# Patient Record
Sex: Male | Born: 1945
Health system: Southern US, Community
[De-identification: ages and names within clinical notes are randomized; demographics above are authoritative.]

## PROBLEM LIST (undated history)

## (undated) DIAGNOSIS — E785 Hyperlipidemia, unspecified: Secondary | ICD-10-CM

## (undated) DIAGNOSIS — E119 Type 2 diabetes mellitus without complications: Secondary | ICD-10-CM

## (undated) DIAGNOSIS — I219 Acute myocardial infarction, unspecified: Secondary | ICD-10-CM

## (undated) DIAGNOSIS — I10 Essential (primary) hypertension: Secondary | ICD-10-CM

## (undated) HISTORY — DX: Essential (primary) hypertension: I10

## (undated) HISTORY — PX: CARDIAC SURGERY: SHX584

## (undated) HISTORY — PX: APPENDECTOMY: SHX54

## (undated) HISTORY — DX: Type 2 diabetes mellitus without complications: E11.9

## (undated) HISTORY — PX: TONSILLECTOMY AND ADENOIDECTOMY: SHX28

## (undated) HISTORY — DX: Hyperlipidemia, unspecified: E78.5

## (undated) HISTORY — DX: Acute myocardial infarction, unspecified: I21.9

## (undated) HISTORY — PX: REPLACEMENT TOTAL KNEE: SUR1224

## (undated) HISTORY — PX: EYE SURGERY: SHX253

---

## 2002-03-04 DIAGNOSIS — E785 Hyperlipidemia, unspecified: Secondary | ICD-10-CM | POA: Insufficient documentation

## 2002-06-29 DIAGNOSIS — I219 Acute myocardial infarction, unspecified: Secondary | ICD-10-CM | POA: Insufficient documentation

## 2012-04-27 ENCOUNTER — Ambulatory Visit: Payer: Self-pay | Admitting: General Practice

## 2012-04-27 DIAGNOSIS — I2510 Atherosclerotic heart disease of native coronary artery without angina pectoris: Secondary | ICD-10-CM

## 2012-04-27 LAB — URINALYSIS, COMPLETE
Bilirubin,UR: NEGATIVE
Ketone: NEGATIVE
Leukocyte Esterase: NEGATIVE
Nitrite: NEGATIVE
Protein: NEGATIVE
RBC,UR: <1 /HPF (ref 0–5)
Specific Gravity: 1.017 (ref 1.003–1.030)
Squamous Epithelial: <1

## 2012-04-27 LAB — BASIC METABOLIC PANEL
BUN: 11 mg/dL (ref 7–18)
Chloride: 107 mmol/L (ref 98–107)
Co2: 27 mmol/L (ref 21–32)
Creatinine: 0.63 mg/dL (ref 0.60–1.30)
Potassium: 4.2 mmol/L (ref 3.5–5.1)

## 2012-04-27 LAB — MRSA PCR SCREENING

## 2012-04-27 LAB — CBC
HCT: 45.9 % (ref 40.0–52.0)
HGB: 15.6 g/dL (ref 13.0–18.0)
MCHC: 33.9 g/dL (ref 32.0–36.0)
MCV: 93 fL (ref 80–100)
Platelet: 166 10*3/uL (ref 150–440)
RBC: 4.95 10*6/uL (ref 4.40–5.90)
RDW: 12.7 % (ref 11.5–14.5)
WBC: 6.8 10*3/uL (ref 3.8–10.6)

## 2012-04-27 LAB — PROTIME-INR: INR: 1

## 2012-04-27 LAB — SEDIMENTATION RATE: Erythrocyte Sed Rate: 2 mm/hr (ref 0–20)

## 2012-04-28 LAB — URINE CULTURE

## 2012-05-18 ENCOUNTER — Inpatient Hospital Stay: Payer: Self-pay | Admitting: General Practice

## 2012-05-19 LAB — PLATELET COUNT: Platelet: 152 10*3/uL (ref 150–440)

## 2012-05-19 LAB — BASIC METABOLIC PANEL
Anion Gap: 5 — ABNORMAL LOW (ref 7–16)
Calcium, Total: 8 mg/dL — ABNORMAL LOW (ref 8.5–10.1)
Chloride: 108 mmol/L — ABNORMAL HIGH (ref 98–107)
Creatinine: 0.76 mg/dL (ref 0.60–1.30)
Glucose: 102 mg/dL — ABNORMAL HIGH (ref 65–99)
Osmolality: 279 (ref 275–301)
Potassium: 3.9 mmol/L (ref 3.5–5.1)

## 2012-05-19 LAB — HEMOGLOBIN: HGB: 12.5 g/dL — ABNORMAL LOW (ref 13.0–18.0)

## 2012-05-20 LAB — BASIC METABOLIC PANEL
Anion Gap: 7 (ref 7–16)
BUN: 10 mg/dL (ref 7–18)
Chloride: 104 mmol/L (ref 98–107)
Co2: 25 mmol/L (ref 21–32)
Creatinine: 0.83 mg/dL (ref 0.60–1.30)
Glucose: 110 mg/dL — ABNORMAL HIGH (ref 65–99)

## 2012-05-20 LAB — HEMOGLOBIN: HGB: 11.3 g/dL — ABNORMAL LOW (ref 13.0–18.0)

## 2012-05-21 ENCOUNTER — Encounter: Payer: Self-pay | Admitting: Internal Medicine

## 2012-06-02 ENCOUNTER — Encounter: Payer: Self-pay | Admitting: Internal Medicine

## 2012-07-02 ENCOUNTER — Encounter: Payer: Self-pay | Admitting: Internal Medicine

## 2013-03-24 ENCOUNTER — Inpatient Hospital Stay: Payer: Self-pay | Admitting: Cardiology

## 2013-03-25 LAB — BASIC METABOLIC PANEL
ANION GAP: 7 (ref 7–16)
BUN: 9 mg/dL (ref 7–18)
CO2: 25 mmol/L (ref 21–32)
Calcium, Total: 8.7 mg/dL (ref 8.5–10.1)
Chloride: 107 mmol/L (ref 98–107)
Creatinine: 0.73 mg/dL (ref 0.60–1.30)
EGFR (African American): >60
EGFR (Non-African Amer.): >60
Glucose: 123 mg/dL — ABNORMAL HIGH (ref 65–99)
Osmolality: 278 (ref 275–301)
Potassium: 3.7 mmol/L (ref 3.5–5.1)
Sodium: 139 mmol/L (ref 136–145)

## 2014-01-18 DIAGNOSIS — Z9889 Other specified postprocedural states: Secondary | ICD-10-CM | POA: Insufficient documentation

## 2014-06-24 NOTE — Op Note (Signed)
PATIENT NAME:  Eric Joyce, Eric Joyce MR#:  [id number] DATE OF BIRTH:  09/29/1945  DATE OF PROCEDURE:  05/18/2012  PREOPERATIVE DIAGNOSIS: Degenerative arthrosis of the right knee.   POSTOPERATIVE DIAGNOSIS: Degenerative arthrosis of the right knee.   PROCEDURE PERFORMED: Right total knee arthroplasty using computer-assisted navigation.   SURGEON: Laurice Record. Hooten, M.D.   ASSISTANT:  Vance Peper, PA  ANESTHESIA: Femoral nerve block and spinal.   ESTIMATED BLOOD LOSS: 100 mL.   FLUIDS REPLACED: 2200 mL of crystalloid.   TOURNIQUET TIME: 115 minutes.   DRAINS: Two medium drains to reinfusion system.   SOFT TISSUE RELEASES: Anterior cruciate ligament, posterior cruciate ligament, deep and superficial medial collateral ligament, and patellofemoral ligament.   IMPLANTS UTILIZED: DePuy PFC Sigma size 5 posterior stabilized femoral component (cemented), size 5 MBT tibial component (cemented), 41 mm 3-peg oval dome patella (cemented), and a 10 mm stabilized rotating platform polyethylene insert.   INDICATIONS FOR SURGERY: The patient is a 69 year old gentleman who has been seen for complaints of progressive right knee pain and varus deformity. X-rays demonstrated severe degenerative changes in tricompartmental fashion with varus deformity appreciated. After discussion of the risks and benefits of surgical intervention, the patient expressed understanding of the risks and benefits and agreed with plans for surgical intervention.   PROCEDURE IN DETAIL: The patient was brought into the operating room and, after adequate femoral nerve block and spinal anesthesia was achieved, a tourniquet was placed on the patient's upper right thigh. The patient's right knee and leg were cleaned and prepped with alcohol and DuraPrep and draped in the usual sterile fashion. A "timeout" was performed as per usual protocol. The right lower extremity was exsanguinated using an Esmarch, and the tourniquet was inflated to 300  mmHg. An anterior longitudinal incision was made followed by a standard mid vastus approach. A moderate effusion was evacuated. The deep fibers of the medial collateral ligament were elevated in subperiosteal fashion off the medial flare of the tibia so as to maintain a continuous soft tissue sleeve. The patella was subluxed laterally and the patellofemoral ligament was incised. Inspection of the knee demonstrated severe degenerative changes in all 3 compartments with evidence of eburnated bone noted to the medial compartment. Prominent osteophytes were debrided using a rongeur. Anterior and posterior cruciate ligaments were excised. Two 4.0 mm Schanz pins were inserted into the femur and into the tibia for attachment of the array of trackers used for computer-assisted navigation. Hip center was identified using circumduction technique. Distal landmarks were mapped using the computer. The distal femur and proximal tibia were mapped using the computer. Distal femoral cutting guide was positioned using computer-assisted navigation so as to achieve 5 degree distal valgus cut. Cut was performed and verified using the computer. The distal femur was sized and it was felt that a size 5 femoral component was appropriate. A size 5 cutting guide was positioned and the anterior cut was performed and verified using the computer. This was followed by completion of the posterior and chamfer cuts. Femoral cutting guide for the central box was then positioned and the central box cut was performed. Attention was then directed to the proximal tibia. Medial and lateral menisci were excised. The extramedullary tibial cutting guide was positioned using computer-assisted navigation so as to achieve 0 degree varus valgus alignment and 0 degree posterior slope. Cut was performed and verified using the computer. The proximal tibia was sized and it was felt that a size 5 tibial tray was appropriate. Tibial and femoral  trials were inserted.  Prominent posterior osteophytes were debrided from the femoral condyles. A 10 mm polyethylene insert was inserted and the knee was placed through a range of motion. The knee was felt to be tight medially. A Cobb elevator was used to elevate the superficial fibers of the medial collateral ligament. This allowed for good medial and lateral soft tissue balancing both in flexion and in extension. Finally, the patella was cut and prepared so as to accommodate a 41 mm three peg oval dome patella. Patellar trial was placed and the knee was placed through a range of motion with excellent patellar tracking appreciated.   Femoral trial was removed. Central post hole for the tibial component was reamed followed by insertion of a keel punch. Tibial trials were then removed. The cut surfaces of bone were irrigated with copious amounts of normal saline with antibiotic solution using pulsatile lavage and then suctioned dry. Polymethyl methacrylate cement with gentamicin was prepared in the usual fashion using a vacuum mixer. Cement was applied to the cut surface of the proximal tibia as well as along the undersurface of a size 5 MBT tibial component. Tibial component was positioned and impacted into place. Excess cement was removed using freer elevators. Cement was then applied to the cut surfaces of the femur as well as along the posterior flanges of a size 5 posterior stabilized femoral component. Femoral component was positioned and impacted into place. Excess cement was removed using freer elevators. A 10 mm polyethylene trial was inserted and the knee was brought into full extension with steady axial compression applied. Finally, cement was applied to the backside of a 41 mm 3-peg oval dome patella and the patellar component was positioned and patellar clamp applied. Excess cement was removed using freer elevators.   After adequate curing of cement, the tourniquet was deflated after total tourniquet time of 115 minutes.  Hemostasis was achieved using electrocautery. The knee was irrigated with copious amounts of normal saline with antibiotic solution using pulsatile lavage and then suctioned dry. The knee was inspected for any residual cement debris. 30 mL of 0.25% Marcaine with epinephrine and 4 mg of morphine was injected along the posterior capsule. A 10 mm stabilized rotating platform polyethylene insert was inserted and the knee was placed through a range of motion. Excellent mediolateral soft tissue balancing was appreciated both in flexion and in extension. Good patellar tracking was appreciated.   Two medium drains were placed in the wound bed and brought out through a separate stab incision to be attached to a reinfusion system. The medial parapatellar portion of the incision was reapproximated using interrupted sutures of #1 Vicryl. The subcutaneous tissue was approximated in layers using first #0 Vicryl followed by #2-0 Vicryl. Skin was closed with skin staples. A sterile dressing was applied.   The patient tolerated the procedure well. He was transported to the recovery room in stable condition. ____________________________ Laurice Record. Holley Bouche., MD jph:sb D: 05/18/2012 20:28:57 ET T: 05/19/2012 09:46:53 ET JOB#: [id number]  cc: Laurice Record. Holley Bouche., MD, <Dictator> Laurice Record Holley Bouche MD ELECTRONICALLY SIGNED 05/19/2012 20:47

## 2014-06-24 NOTE — Discharge Summary (Signed)
PATIENT NAME:  Eric Joyce, Eric Joyce MR#:  [id number] DATE OF BIRTH:  [birth year]  DATE OF ADMISSION:  05/18/2012 DATE OF DISCHARGE:  05/21/2012  ADMITTING DIAGNOSIS: Degenerative arthrosis, right knee.   DISCHARGE DIAGNOSIS: Degenerative arthrosis, right knee.   PROCEDURE: Right total knee arthroplasty using computer-assisted navigation.   SURGEON: Laurice Record. Hooten, M.D.   ASSISTANT: Vance Peper, PA-C.   ANESTHESIA: Femoral nerve block, and spinal.   ESTIMATED BLOOD LOSS: 100 mL.  FLUIDS REPLACED: 2200 mL of crystalloid.   TOURNIQUET TIME: 115 minutes.   DRAINS: Two medium drains to reinfusion system.   CONDITION AT RECOVERY: Stable.   HISTORY: The patient is a 69 year old who presents today for a history and physical. He is to undergo a right total knee on 05/18/2012. He has had a greater than 10-year history of progressive right knee pain. He reports episodes of swelling giving way. He has a remote history of right knee arthroscopy, partial medial and lateral meniscectomies, and chondroplasty. The patient reports increased medial right knee pain with weightbearing activities. He is not currently using any ambulatory aides. He continued to use a sleeve for support. He has difficulty with stair ambulation as well as with arising from a seated position due to the pain. Right knee pain has progressed to the point that it is significantly interfering with his activities of daily living.   PHYSICAL EXAMINATION:  GENERAL: Well-developed, well-nourished male in no acute distress. Antalgic gait. Varus thrust to the right knee.  HEENT: Atraumatic, normocephalic. Pupils equal, round, and reactive to light. Extraocular motion is intact. Sclerae are clear. Oropharynx is clear, with moist mucosa.  LUNGS: Clear to auscultation bilaterally.  CARDIOVASCULAR: Regular rate and rhythm. Normal S1, S2. No murmur. No appreciable gallops, rubs. Peripheral pulses are palpable. No lower extremity edema. Homan's test  is negative.  ABDOMEN: Soft, nontender, nondistended. Bowel sounds are present.  EXTREMITIES: Good strength, stability, and range of motion of the upper extremities. Good range of motion of the hips and ankles.  RIGHT KNEE: Examination of the right knee shows the patient has no swelling, effusion,  erythema. He has crepitus on range of motion. His tenderness is mostly at the medial joint line and he has a relative varus alignment. He has a negative anterior Lachman's test. He has a negative McMurray's test. His range of motion is 06 to 116 degrees. His pulses are palpable and he has good cap refill. There is no gross pretibial ankle edema. Homan's test is negative. He is neurovascularly intact in the right lower extremity.   HOSPITAL COURSE: The patient was admitted to the hospital on 05/18/2012. He had surgery that same day and was brought to the orthopedic floor from the PACU in stable condition. The patient on postoperative day 1, his labs and vital signs remained stable. He had excellent progress with physical therapy up to 200 feet with ambulation. On postoperative day 2 the patient's vital signs and lab work remained stable and the patient did have decreased distances with his physical therapy due to soreness. Overall the patient was doing well and stable. On postoperative day 3, 05/21/2012, the patient was stable and ready for discharge to a rehab facility.   CONDITION AT DISCHARGE: Stable.   DISCHARGE INSTRUCTIONS: The patient may gradually increase weightbearing on the affected extremity. He is weightbearing as tolerated, and he is to elevate the affected foot or leg on 1 or 2 pillows, with the foot higher than the knee. Thigh-high TED hose on both legs, remove at  bedtime. Replace when arising the next morning. Elevate heels off the bed. He needs to use incentive spirometry cough every hour while awake and cough and deep-breathe every hour. He may resume a regular diet as tolerated. He is to  continue using the [facility] Care unit, maintaining a temperature between 40 and 50 degrees. Do not get the dressing or bandage wet or dirty. Call [facility] if the dressing gets water under it. Leave the dressing on. Call [facility] if any of the following occur: Bright red bleeding from the incision wound, fever above 101.5 degrees, redness, swelling, or drainage at the incision. Call [facility] if you experience any increased leg pain, numbness or weakness in legs, or bowel or bladder symptoms. He needs to follow up with [facility] in  2  weeks.   DISCHARGE MEDICATIONS: Zolpidem 10 mg oral tablet, 1 tablet orally once a day at bedtime; simvastatin 40 mg oral tablet,  1 tablet orally once a day in the morning; vitamin D3 1000 international units oral capsule,  1 capsule orally 2 times a day; lisinopril 10 mg oral tablet, 1 tablet orally once a day in the morning; magnesium citrate 1 tablet orally 2 times a day; saw palmetto oral capsule, 1 capsule orally 2 times a day; Co-Q 10, 100 mg oral capsule, one capsule orally 2 times a day; metoprolol succinate ER 50 mg oral tablet extended-release, 1 tablet orally once a day in the morning; Ultimate Omega 100 mg oral capsule one capsule orally 2 times a day; metformin 500 mg oral tablet 1 tablet orally 2 times a day; Alive! once daily, Men's 50 mg tablet, 1 tablet orally once a day; dutasteride 0.5 mg oral capsule, one capsule orally once a day; Tylenol 500 mg oral tablet, 1 tablet orally every 4 hours as needed for pain or temp greater than 101.4; oxycodone 5 mg oral tablet, 1 to 2 tablets orally every 4 hours as needed for pain; tramadol 50 mg oral tablet, 1 to 2 tablets orally every 4 hours as needed for pain; magnesium hydroxide 8% oral suspension, 30 mL orally 2 times a day as needed for constipation; tamsulosin 0.4 mg oral capsule, one capsule orally once daily after meal; Lovenox 40 mg  subcutaneous once a day for 14 days; bisacodyl 10 mg rectal suppository 1 suppository rectally once a day as needed for constipation; omega-3 polyunsaturated fatty acids 1 gram orally 2 times a day; Pantoprazole 40 mg oral delayed-release capsule 2 times a day.     ____________________________ Duanne Guess, PA-C tcg:dm D: 05/21/2012 08:04:00 ET T: 05/21/2012 08:17:09 ET JOB#: [id number]  cc: Duanne Guess, PA-C, <Dictator> Duanne Guess [place] ELECTRONICALLY SIGNED 06/03/2012 12:33

## 2014-06-25 NOTE — Discharge Summary (Signed)
PATIENT NAME:  Eric Joyce, Eric Joyce MR#:  [id number] DATE OF BIRTH:  Apr 16, 1945  DATE OF ADMISSION:  03/24/2013 DATE OF DISCHARGE: 03/25/2013    PRIMARY CARE PHYSICIAN: Dr. Lovie Macadamia.  FINAL DIAGNOSES:  1.  Coronary artery disease.  2.  Hyperlipidemia.   DISCHARGE MEDICATIONS: Aspirin 81 mg daily, simvastatin 40 mg daily, lisinopril 10 mg daily, metoprolol succinate 50 mg daily, zolpidem 10 mg at bedtime, vitamin D 3000 units b.i.d., magnesium citrate 1 tab b.i.d., sol palmetto 1 capsule b.i.d., Co-Q10 1 capsule b.i.d., metformin 500 mg b.i.d., omega-3 polyunsaturated fatty acids 1 gram b.i.d.    PROCEDURES:  1.  Cardiac catheterization with selective coronary arteriography on 03/24/2013.  2.  Fractional flow reserve management on 03/24/2013.   HISTORY OF PRESENT ILLNESS: Please see admission H and [place] COURSE: The patient underwent elective cardiac catheterization on 03/24/2013. Coronary arteriography revealed patent stents in the proximal left circumflex and first obtuse marginal branch, 50% stenosis in the mid left anterior descending coronary artery and 70% stenosis in the mid PDA. Left ventriculography revealed normal left ventricular function. When switching diagnostic left Judkins catheter for guide catheter in order to perform fractional flow reserve management of a 6% stenosis in the ostium of the left circumflex, the patient demonstrated diffuse TIMI 2 flow in the left coronary artery, became bradycardic and asystolic requiring 20 to 30 seconds of CPR. This was restored to sinus rhythm and normal blood pressure. Reinjection of the left coronary artery with a guiding catheter demonstrated TIMI 3 flow. Fractional flow reserve management of the ostial left circumflex coronary artery was performed, which was 0.90 indicative of a non-hemodynamically significant stenosis. Since the patient received CPR, the patient was observed overnight with an uncomplicated hospital course. The patient  remained in sinus rhythm. He did not experience any angina, though did experience chest wall tenderness from the CPR. On the morning of 03/25/2013, the patient was ambulating in the halls without difficulty and was discharged home. He is scheduled to see me in followup in 1 week.  ____________________________ Isaias Cowman, MD ap:aw D: 03/25/2013 07:50:09 ET T: 03/25/2013 08:01:42 ET JOB#: [id number]  cc: Isaias Cowman, MD, <Dictator> Isaias Cowman MD ELECTRONICALLY SIGNED 04/20/2013 12:29

## 2014-06-25 NOTE — Consult Note (Signed)
Brief Consult Note: Diagnosis: Patient experienced asystolic event during cardiac cath requiring brief CPR, possibly due to vasospasm.   Comments: REC  Oberserve at least overnight, cycle cardiac biomakers, tele.  Electronic Signatures: Isaias Cowman (MD)  (Signed 21-Jan-15 12:18)  Authored: Brief Consult Note   Last Updated: 21-Jan-15 12:18 by Isaias Cowman (MD)

## 2017-03-20 DIAGNOSIS — N401 Enlarged prostate with lower urinary tract symptoms: Secondary | ICD-10-CM | POA: Diagnosis not present

## 2017-03-20 DIAGNOSIS — R351 Nocturia: Secondary | ICD-10-CM | POA: Diagnosis not present

## 2017-03-27 DIAGNOSIS — Z9889 Other specified postprocedural states: Secondary | ICD-10-CM | POA: Diagnosis not present

## 2017-03-27 DIAGNOSIS — E119 Type 2 diabetes mellitus without complications: Secondary | ICD-10-CM | POA: Diagnosis not present

## 2017-03-27 DIAGNOSIS — I214 Non-ST elevation (NSTEMI) myocardial infarction: Secondary | ICD-10-CM | POA: Diagnosis not present

## 2017-03-27 DIAGNOSIS — I2510 Atherosclerotic heart disease of native coronary artery without angina pectoris: Secondary | ICD-10-CM | POA: Diagnosis not present

## 2017-03-27 DIAGNOSIS — E785 Hyperlipidemia, unspecified: Secondary | ICD-10-CM | POA: Diagnosis not present

## 2017-04-17 DIAGNOSIS — I214 Non-ST elevation (NSTEMI) myocardial infarction: Secondary | ICD-10-CM | POA: Diagnosis not present

## 2017-04-17 DIAGNOSIS — I2510 Atherosclerotic heart disease of native coronary artery without angina pectoris: Secondary | ICD-10-CM | POA: Diagnosis not present

## 2017-04-24 DIAGNOSIS — I2510 Atherosclerotic heart disease of native coronary artery without angina pectoris: Secondary | ICD-10-CM | POA: Diagnosis not present

## 2017-04-24 DIAGNOSIS — I214 Non-ST elevation (NSTEMI) myocardial infarction: Secondary | ICD-10-CM | POA: Diagnosis not present

## 2017-04-24 DIAGNOSIS — Z9889 Other specified postprocedural states: Secondary | ICD-10-CM | POA: Diagnosis not present

## 2017-04-24 DIAGNOSIS — E785 Hyperlipidemia, unspecified: Secondary | ICD-10-CM | POA: Diagnosis not present

## 2017-06-09 ENCOUNTER — Encounter: Payer: Self-pay | Admitting: Podiatry

## 2017-06-09 ENCOUNTER — Ambulatory Visit: Payer: No Typology Code available for payment source

## 2017-06-09 ENCOUNTER — Ambulatory Visit (INDEPENDENT_AMBULATORY_CARE_PROVIDER_SITE_OTHER): Payer: Non-veteran care

## 2017-06-09 ENCOUNTER — Ambulatory Visit (INDEPENDENT_AMBULATORY_CARE_PROVIDER_SITE_OTHER): Payer: Medicare (Managed Care) | Admitting: Podiatry

## 2017-06-09 DIAGNOSIS — M722 Plantar fascial fibromatosis: Secondary | ICD-10-CM

## 2017-06-09 DIAGNOSIS — M205X1 Other deformities of toe(s) (acquired), right foot: Secondary | ICD-10-CM

## 2017-06-09 MED ORDER — MELOXICAM 7.5 MG PO TABS: 7.5000 mg | ORAL_TABLET | Freq: Every day | ORAL | 0 refills | Status: DC

## 2017-06-09 NOTE — Progress Notes (Signed)
His patient presents the office with chief complaint of painful right heel.  Patient states that he is referred to this office by the [place].  He says he has pain in the right heel after walking. He says his pain today is a 4 out of 10.  He says this condition is worsening over time and he has received a referral through the [place] for this office.  He has provided no self treatment nor sought any professional help.  He says he is diabetic, type II.  He also says that he is taking 400 mg of gabapentin twice a day for neuropathy.  He states his toes are numb.  He also says he has occasional electric shocks through his forefoot into his toes.  Finally, he says he feels he has limited motion through the balls of both feet.   General Appearance  Alert, conversant and in no acute stress.  Vascular  Dorsalis pedis and posterior tibial  pulses are palpable  bilaterally.  Capillary return is within normal limits  bilaterally. Temperature is within normal limits  bilaterally.  Neurologic  Senn-Weinstein monofilament wire test within normal limits  bilaterally. Muscle power within normal limits bilaterally.  Nails normal nails noted with no evidence of any bacterial or fungal infection.  Orthopedic  No limitations of motion of motion feet .  No crepitus or effusions noted.  Palpable pain noted at the insertion of the plantar fascia of the right heel.  Hallux limitus  of the right greater than the left foot.  Skin  normotropic skin with no porokeratosis noted bilaterally.  No signs of infections or ulcers noted.     Plantar fascitis secondary hallux limitus right foot   IE  X-rays taken of the right foot reveal minimal calcification at the insertion of the plantar fascia and the Achilles tendon right.  Patient also has an elongated first metatarsal right foot.  Explain the condition to this patient.  Patient was recommended to wear power step insoles as well as prescribed Mobic 7.5 mg to be taken daily.  Return to  the clinic in 3 weeks for further evaluation and treatment.   Gardiner Barefoot DPM

## 2017-06-30 ENCOUNTER — Encounter: Payer: Self-pay | Admitting: Podiatry

## 2017-06-30 ENCOUNTER — Ambulatory Visit (INDEPENDENT_AMBULATORY_CARE_PROVIDER_SITE_OTHER): Payer: Self-pay | Admitting: Podiatry

## 2017-06-30 DIAGNOSIS — E1142 Type 2 diabetes mellitus with diabetic polyneuropathy: Secondary | ICD-10-CM

## 2017-06-30 DIAGNOSIS — M722 Plantar fascial fibromatosis: Secondary | ICD-10-CM

## 2017-06-30 DIAGNOSIS — M205X1 Other deformities of toe(s) (acquired), right foot: Secondary | ICD-10-CM

## 2017-06-30 NOTE — Progress Notes (Signed)
This patient returns to the office for continued treatment of plantar fascitis right foot.  He was treated with power step insoles and prescribed Mobic. He says he is not having any problem with the Mobic and is faithfully wearing the power step insoles.  He says he is doing good today and is in very little pain.  He presents the office today for continued evaluation and treatment.  This patient is diabetic with neuropathy.  General Appearance  Alert, conversant and in no acute stress.  Vascular  Dorsalis pedis and posterior tibial  pulses are palpable  bilaterally.  Capillary return is within normal limits  bilaterally. Temperature is within normal limits  bilaterally.  Neurologic  Senn-Weinstein monofilament wire test within normal limits  bilaterally. Muscle power within normal limits bilaterally.  Nails Normal nails noted with no evidence of bacterial or fungal infection.  Orthopedic  No limitations of motion of motion feet .  No crepitus or effusions noted.  No palpable pain right heel.  Hallux limitus  B/L.  Skin  normotropic skin with no porokeratosis noted bilaterally.  No signs of infections or ulcers noted.    Plantar fascitis secondary to hallux limitus.  ROV.  Patient was told to continue wearing his power step insoles and continue taking the Mobic daily.  Return to the clinic when necessary  Gardiner Barefoot DPM

## 2017-10-09 DIAGNOSIS — I214 Non-ST elevation (NSTEMI) myocardial infarction: Secondary | ICD-10-CM | POA: Diagnosis not present

## 2017-10-09 DIAGNOSIS — E785 Hyperlipidemia, unspecified: Secondary | ICD-10-CM | POA: Diagnosis not present

## 2017-10-09 DIAGNOSIS — E119 Type 2 diabetes mellitus without complications: Secondary | ICD-10-CM | POA: Diagnosis not present

## 2017-10-09 DIAGNOSIS — Z9889 Other specified postprocedural states: Secondary | ICD-10-CM | POA: Diagnosis not present

## 2017-10-09 DIAGNOSIS — I2510 Atherosclerotic heart disease of native coronary artery without angina pectoris: Secondary | ICD-10-CM | POA: Diagnosis not present

## 2018-03-16 DIAGNOSIS — N401 Enlarged prostate with lower urinary tract symptoms: Secondary | ICD-10-CM | POA: Diagnosis not present

## 2018-03-16 DIAGNOSIS — R351 Nocturia: Secondary | ICD-10-CM | POA: Diagnosis not present

## 2018-03-17 DIAGNOSIS — Z125 Encounter for screening for malignant neoplasm of prostate: Secondary | ICD-10-CM | POA: Diagnosis not present

## 2018-03-17 DIAGNOSIS — N401 Enlarged prostate with lower urinary tract symptoms: Secondary | ICD-10-CM | POA: Diagnosis not present

## 2018-03-17 DIAGNOSIS — R351 Nocturia: Secondary | ICD-10-CM | POA: Diagnosis not present

## 2018-03-24 DIAGNOSIS — N401 Enlarged prostate with lower urinary tract symptoms: Secondary | ICD-10-CM | POA: Diagnosis not present

## 2018-04-01 DIAGNOSIS — R351 Nocturia: Secondary | ICD-10-CM | POA: Diagnosis not present

## 2018-04-01 DIAGNOSIS — N401 Enlarged prostate with lower urinary tract symptoms: Secondary | ICD-10-CM | POA: Diagnosis not present

## 2018-04-07 DIAGNOSIS — N401 Enlarged prostate with lower urinary tract symptoms: Secondary | ICD-10-CM | POA: Diagnosis not present

## 2018-04-16 DIAGNOSIS — Z9889 Other specified postprocedural states: Secondary | ICD-10-CM | POA: Diagnosis not present

## 2018-04-16 DIAGNOSIS — E119 Type 2 diabetes mellitus without complications: Secondary | ICD-10-CM | POA: Diagnosis not present

## 2018-04-16 DIAGNOSIS — I214 Non-ST elevation (NSTEMI) myocardial infarction: Secondary | ICD-10-CM | POA: Diagnosis not present

## 2018-04-16 DIAGNOSIS — I2510 Atherosclerotic heart disease of native coronary artery without angina pectoris: Secondary | ICD-10-CM | POA: Diagnosis not present

## 2018-04-16 DIAGNOSIS — E785 Hyperlipidemia, unspecified: Secondary | ICD-10-CM | POA: Diagnosis not present

## 2018-04-21 DIAGNOSIS — N401 Enlarged prostate with lower urinary tract symptoms: Secondary | ICD-10-CM | POA: Diagnosis not present

## 2018-04-21 DIAGNOSIS — N5201 Erectile dysfunction due to arterial insufficiency: Secondary | ICD-10-CM | POA: Diagnosis not present

## 2018-04-22 DIAGNOSIS — H1852 Epithelial (juvenile) corneal dystrophy: Secondary | ICD-10-CM | POA: Diagnosis not present

## 2018-04-28 DIAGNOSIS — N401 Enlarged prostate with lower urinary tract symptoms: Secondary | ICD-10-CM | POA: Diagnosis not present

## 2018-05-12 DIAGNOSIS — N401 Enlarged prostate with lower urinary tract symptoms: Secondary | ICD-10-CM | POA: Diagnosis not present

## 2018-05-12 DIAGNOSIS — R350 Frequency of micturition: Secondary | ICD-10-CM | POA: Diagnosis not present

## 2018-08-11 DIAGNOSIS — N401 Enlarged prostate with lower urinary tract symptoms: Secondary | ICD-10-CM | POA: Diagnosis not present

## 2018-08-13 DIAGNOSIS — N5201 Erectile dysfunction due to arterial insufficiency: Secondary | ICD-10-CM | POA: Diagnosis not present

## 2018-08-13 DIAGNOSIS — N401 Enlarged prostate with lower urinary tract symptoms: Secondary | ICD-10-CM | POA: Diagnosis not present

## 2018-08-15 DIAGNOSIS — Z01812 Encounter for preprocedural laboratory examination: Secondary | ICD-10-CM | POA: Diagnosis not present

## 2018-08-15 DIAGNOSIS — Z1159 Encounter for screening for other viral diseases: Secondary | ICD-10-CM | POA: Diagnosis not present

## 2018-08-15 DIAGNOSIS — H1852 Epithelial (juvenile) corneal dystrophy: Secondary | ICD-10-CM | POA: Diagnosis not present

## 2018-08-18 DIAGNOSIS — H1852 Epithelial (juvenile) corneal dystrophy: Secondary | ICD-10-CM | POA: Diagnosis not present

## 2018-08-26 DIAGNOSIS — H2511 Age-related nuclear cataract, right eye: Secondary | ICD-10-CM | POA: Diagnosis not present

## 2018-08-26 DIAGNOSIS — H1852 Epithelial (juvenile) corneal dystrophy: Secondary | ICD-10-CM | POA: Diagnosis not present

## 2018-10-07 DIAGNOSIS — H1852 Epithelial (juvenile) corneal dystrophy: Secondary | ICD-10-CM | POA: Diagnosis not present

## 2018-10-20 DIAGNOSIS — Z9889 Other specified postprocedural states: Secondary | ICD-10-CM | POA: Diagnosis not present

## 2018-10-20 DIAGNOSIS — I214 Non-ST elevation (NSTEMI) myocardial infarction: Secondary | ICD-10-CM | POA: Diagnosis not present

## 2018-10-20 DIAGNOSIS — E119 Type 2 diabetes mellitus without complications: Secondary | ICD-10-CM | POA: Diagnosis not present

## 2018-10-20 DIAGNOSIS — E785 Hyperlipidemia, unspecified: Secondary | ICD-10-CM | POA: Diagnosis not present

## 2018-10-20 DIAGNOSIS — I2510 Atherosclerotic heart disease of native coronary artery without angina pectoris: Secondary | ICD-10-CM | POA: Diagnosis not present

## 2018-10-29 DIAGNOSIS — Z79899 Other long term (current) drug therapy: Secondary | ICD-10-CM | POA: Diagnosis not present

## 2018-12-09 DIAGNOSIS — H2511 Age-related nuclear cataract, right eye: Secondary | ICD-10-CM | POA: Diagnosis not present

## 2018-12-09 DIAGNOSIS — H18529 Epithelial (juvenile) corneal dystrophy, unspecified eye: Secondary | ICD-10-CM | POA: Diagnosis not present

## 2018-12-20 DIAGNOSIS — H18529 Epithelial (juvenile) corneal dystrophy, unspecified eye: Secondary | ICD-10-CM | POA: Diagnosis not present

## 2018-12-20 DIAGNOSIS — Z20828 Contact with and (suspected) exposure to other viral communicable diseases: Secondary | ICD-10-CM | POA: Diagnosis not present

## 2018-12-22 DIAGNOSIS — H18529 Epithelial (juvenile) corneal dystrophy, unspecified eye: Secondary | ICD-10-CM | POA: Diagnosis not present

## 2019-01-25 DIAGNOSIS — E119 Type 2 diabetes mellitus without complications: Secondary | ICD-10-CM | POA: Diagnosis not present

## 2019-02-03 DIAGNOSIS — Z1211 Encounter for screening for malignant neoplasm of colon: Secondary | ICD-10-CM | POA: Diagnosis not present

## 2019-03-15 DIAGNOSIS — Z125 Encounter for screening for malignant neoplasm of prostate: Secondary | ICD-10-CM | POA: Diagnosis not present

## 2019-03-15 DIAGNOSIS — N401 Enlarged prostate with lower urinary tract symptoms: Secondary | ICD-10-CM | POA: Diagnosis not present

## 2019-03-15 DIAGNOSIS — N5201 Erectile dysfunction due to arterial insufficiency: Secondary | ICD-10-CM | POA: Diagnosis not present

## 2019-03-25 DIAGNOSIS — N401 Enlarged prostate with lower urinary tract symptoms: Secondary | ICD-10-CM | POA: Diagnosis not present

## 2019-03-30 DIAGNOSIS — N5201 Erectile dysfunction due to arterial insufficiency: Secondary | ICD-10-CM | POA: Diagnosis not present

## 2019-03-30 DIAGNOSIS — N401 Enlarged prostate with lower urinary tract symptoms: Secondary | ICD-10-CM | POA: Diagnosis not present

## 2019-04-23 DIAGNOSIS — I214 Non-ST elevation (NSTEMI) myocardial infarction: Secondary | ICD-10-CM | POA: Diagnosis not present

## 2019-04-23 DIAGNOSIS — Z9889 Other specified postprocedural states: Secondary | ICD-10-CM | POA: Diagnosis not present

## 2019-04-23 DIAGNOSIS — E119 Type 2 diabetes mellitus without complications: Secondary | ICD-10-CM | POA: Diagnosis not present

## 2019-04-23 DIAGNOSIS — E785 Hyperlipidemia, unspecified: Secondary | ICD-10-CM | POA: Diagnosis not present

## 2019-04-23 DIAGNOSIS — I2510 Atherosclerotic heart disease of native coronary artery without angina pectoris: Secondary | ICD-10-CM | POA: Diagnosis not present

## 2019-10-26 DIAGNOSIS — Z9889 Other specified postprocedural states: Secondary | ICD-10-CM | POA: Diagnosis not present

## 2019-10-26 DIAGNOSIS — I2510 Atherosclerotic heart disease of native coronary artery without angina pectoris: Secondary | ICD-10-CM | POA: Diagnosis not present

## 2019-10-26 DIAGNOSIS — E785 Hyperlipidemia, unspecified: Secondary | ICD-10-CM | POA: Diagnosis not present

## 2019-10-26 DIAGNOSIS — E119 Type 2 diabetes mellitus without complications: Secondary | ICD-10-CM | POA: Diagnosis not present

## 2019-10-26 DIAGNOSIS — I214 Non-ST elevation (NSTEMI) myocardial infarction: Secondary | ICD-10-CM | POA: Diagnosis not present

## 2020-01-18 DIAGNOSIS — Z0000 Encounter for general adult medical examination without abnormal findings: Secondary | ICD-10-CM | POA: Diagnosis not present

## 2020-04-06 DIAGNOSIS — E119 Type 2 diabetes mellitus without complications: Secondary | ICD-10-CM | POA: Diagnosis not present

## 2020-04-06 DIAGNOSIS — E785 Hyperlipidemia, unspecified: Secondary | ICD-10-CM | POA: Diagnosis not present

## 2020-04-06 DIAGNOSIS — I214 Non-ST elevation (NSTEMI) myocardial infarction: Secondary | ICD-10-CM | POA: Diagnosis not present

## 2020-04-06 DIAGNOSIS — I2510 Atherosclerotic heart disease of native coronary artery without angina pectoris: Secondary | ICD-10-CM | POA: Diagnosis not present

## 2020-04-06 DIAGNOSIS — Z9889 Other specified postprocedural states: Secondary | ICD-10-CM | POA: Diagnosis not present

## 2020-04-13 ENCOUNTER — Ambulatory Visit (INDEPENDENT_AMBULATORY_CARE_PROVIDER_SITE_OTHER): Payer: No Typology Code available for payment source | Admitting: Podiatry

## 2020-04-13 ENCOUNTER — Other Ambulatory Visit: Payer: Self-pay

## 2020-04-13 ENCOUNTER — Encounter: Payer: Self-pay | Admitting: Podiatry

## 2020-04-13 DIAGNOSIS — E1142 Type 2 diabetes mellitus with diabetic polyneuropathy: Secondary | ICD-10-CM | POA: Diagnosis not present

## 2020-04-13 DIAGNOSIS — E1140 Type 2 diabetes mellitus with diabetic neuropathy, unspecified: Secondary | ICD-10-CM | POA: Insufficient documentation

## 2020-04-13 DIAGNOSIS — M2010 Hallux valgus (acquired), unspecified foot: Secondary | ICD-10-CM | POA: Diagnosis not present

## 2020-04-13 NOTE — Progress Notes (Signed)
This patient returns to my office for at risk foot care.  This patient requires this care by a professional since this patient will be at risk due to having diabetes.  He says he presents for a diabetic foot exam requested by the [place].  He says his neuropathy is the same but the numbness has worsened.  This patient presents for at risk foot care today.  General Appearance  Alert, conversant and in no acute stress.  Vascular  Dorsalis pedis and posterior tibial  pulses are palpable  bilaterally.  Capillary return is within normal limits  bilaterally. Temperature is within normal limits  bilaterally.  Neurologic  Senn-Weinstein monofilament wire test diminished/absent bilaterally. Muscle power within normal limits bilaterally.  Nails Normal nails noted with no drainage or infection noted.. No evidence of bacterial infection or drainage bilaterally.  Orthopedic  No limitations of motion  feet .  No crepitus or effusions noted.  No bony pathology or digital deformities noted.  HAV  B/L.  Skin  normotropic skin with no porokeratosis noted bilaterally.  No signs of infections or ulcers noted.     Diabetic neuropathy  HAV  B/L.  Consent was obtained for treatment procedures. Diabetic foot exam performed reveals good vascular findings.  LOPS is diminished/absent.  Patient does qualify for diabetic shoes from the [place].   Return office visit   prn                  Told patient to return for periodic foot care and evaluation due to potential at risk complications.   Gardiner Barefoot DPM

## 2020-10-04 DIAGNOSIS — E785 Hyperlipidemia, unspecified: Secondary | ICD-10-CM | POA: Diagnosis not present

## 2020-10-04 DIAGNOSIS — I2510 Atherosclerotic heart disease of native coronary artery without angina pectoris: Secondary | ICD-10-CM | POA: Diagnosis not present

## 2020-10-04 DIAGNOSIS — E119 Type 2 diabetes mellitus without complications: Secondary | ICD-10-CM | POA: Diagnosis not present

## 2020-10-04 DIAGNOSIS — E6609 Other obesity due to excess calories: Secondary | ICD-10-CM | POA: Insufficient documentation

## 2020-10-19 ENCOUNTER — Ambulatory Visit (INDEPENDENT_AMBULATORY_CARE_PROVIDER_SITE_OTHER): Payer: PPO | Admitting: Nurse Practitioner

## 2020-10-19 ENCOUNTER — Other Ambulatory Visit: Payer: Self-pay

## 2020-10-19 ENCOUNTER — Encounter (INDEPENDENT_AMBULATORY_CARE_PROVIDER_SITE_OTHER): Payer: Self-pay | Admitting: Nurse Practitioner

## 2020-10-19 VITALS — BP 131/82 | HR 56 | Resp 16 | Ht 73.0 in | Wt 232.0 lb

## 2020-10-19 DIAGNOSIS — I8312 Varicose veins of left lower extremity with inflammation: Secondary | ICD-10-CM

## 2020-10-19 DIAGNOSIS — I8390 Asymptomatic varicose veins of unspecified lower extremity: Secondary | ICD-10-CM | POA: Insufficient documentation

## 2020-10-19 DIAGNOSIS — I8311 Varicose veins of right lower extremity with inflammation: Secondary | ICD-10-CM

## 2020-10-19 DIAGNOSIS — E785 Hyperlipidemia, unspecified: Secondary | ICD-10-CM | POA: Diagnosis not present

## 2020-10-19 DIAGNOSIS — I2510 Atherosclerotic heart disease of native coronary artery without angina pectoris: Secondary | ICD-10-CM | POA: Insufficient documentation

## 2020-10-19 DIAGNOSIS — E119 Type 2 diabetes mellitus without complications: Secondary | ICD-10-CM | POA: Insufficient documentation

## 2020-10-19 DIAGNOSIS — I10 Essential (primary) hypertension: Secondary | ICD-10-CM | POA: Insufficient documentation

## 2020-10-30 NOTE — Progress Notes (Signed)
Subjective:    Patient ID: Arnegard, male    DOB: January 29, 1946, 75 y.o.   MRN: [id number] Chief Complaint  Patient presents with   New Patient (Initial Visit)    Ref Paraschos varicose veins    Eric Joyce is a 75 year old male that is seen for evaluation of symptomatic varicose veins. The patient relates burning and stinging which worsened steadily throughout the course of the day, particularly with standing. The patient also notes an aching and throbbing pain over the varicosities, particularly with prolonged dependent positions. The symptoms are significantly improved with elevation.  The patient also notes that during hot weather the symptoms are greatly intensified. The patient states the pain from the varicose veins interferes with work, daily exercise, shopping and household maintenance. At this point, the symptoms are persistent and severe enough that they're having a negative impact on lifestyle and are interfering with daily activities.  There is no history of DVT, PE or superficial thrombophlebitis. There is no history of ulceration or hemorrhage. The patient denies a significant family history of varicose veins.   The patient has not worn graduated compression in the past. At the present time the patient has not been using over-the-counter analgesics. There is no history of prior surgical intervention or sclerotherapy.      Review of Systems  Cardiovascular:  Positive for leg swelling.  All other systems reviewed and are negative.     Objective:   Physical Exam Vitals reviewed.  HENT:     Head: Normocephalic.  Cardiovascular:     Rate and Rhythm: Normal rate.     Pulses: Normal pulses.  Pulmonary:     Effort: Pulmonary effort is normal.  Musculoskeletal:        General: Normal range of motion.  Skin:    General: Skin is warm and dry.  Neurological:     Mental Status: He is alert and oriented to person, place, and time.  Psychiatric:        Mood and  Affect: Mood normal.        Behavior: Behavior normal.        Thought Content: Thought content normal.        Judgment: Judgment normal.    BP 131/82 (BP Location: Right Arm)   Pulse (!) 56   Resp 16   Ht '6\' 1"'$  (1.854 m)   Wt 232 lb (105.2 kg)   BMI 30.61 kg/m   Past Medical History:  Diagnosis Date   Diabetes mellitus without complication (HCC)    Hyperlipidemia    Hypertension    Myocardial infarction [facility])     Social History   Socioeconomic History   Marital status: Single    Spouse name: Not on file   Number of children: Not on file   Years of education: Not on file   Highest education level: Not on file  Occupational History   Not on file  Tobacco Use   Smoking status: Former   Smokeless tobacco: Never  Substance and Sexual Activity   Alcohol use: Not Currently   Drug use: Never   Sexual activity: Not on file  Other Topics Concern   Not on file  Social History Narrative   Not on file   Social Determinants of Health   Financial Resource Strain: Not on file  Food Insecurity: Not on file  Transportation Needs: Not on file  Physical Activity: Not on file  Stress: Not on file  Social Connections: Not on file  Intimate Partner Violence: Not on file    Past Surgical History:  Procedure Laterality Date   APPENDECTOMY     CARDIAC SURGERY     2 stents   EYE SURGERY     REPLACEMENT TOTAL KNEE Right    TONSILLECTOMY AND ADENOIDECTOMY      Family History  Problem Relation Age of Onset   Lung cancer Mother    Heart disease Father    Diabetes Paternal Uncle    Diabetes Paternal Grandmother     No Known Allergies  CBC Latest Ref Rng & Units 05/20/2012 05/19/2012 04/27/2012  WBC 3.8 - 10.6 x10 3/mm 3 - - 6.8  Hemoglobin 13.0 - 18.0 g/dL 11.3(L) 12.5(L) 15.6  Hematocrit 40.0 - 52.0 % - - 45.9  Platelets 150 - 440 x10 3/mm 3 131(L) 152 166      CMP     Component Value Date/Time   NA 139 03/25/2013 0404   K 3.7 03/25/2013 0404   CL 107 03/25/2013  0404   CO2 25 03/25/2013 0404   GLUCOSE 123 (H) 03/25/2013 0404   BUN 9 03/25/2013 0404   CREATININE 0.73 03/25/2013 0404   CALCIUM 8.7 03/25/2013 0404   GFRNONAA >60 03/25/2013 0404   GFRAA >60 03/25/2013 0404     No results found.     Assessment & Plan:   1. Varicose veins of both lower extremities with inflammation  Recommend:  The patient has large symptomatic varicose veins that are painful and associated with swelling.  I have had a long discussion with the patient regarding  varicose veins and why they cause symptoms.  Patient will begin wearing graduated compression stockings class 1 on a daily basis, beginning first thing in the morning and removing them in the evening. The patient is instructed specifically not to sleep in the stockings.    The patient  will also begin using over-the-counter analgesics such as Motrin 600 mg po TID to help control the symptoms.    In addition, behavioral modification including elevation during the day will be initiated.    Pending the results of these changes the  patient will be reevaluated in three months.   An  ultrasound of the venous system will be obtained.   Further plans will be based on the ultrasound results and whether conservative therapies are successful at eliminating the pain and swelling.    2. Hyperlipidemia, unspecified hyperlipidemia type Continue statin as ordered and reviewed, no changes at this time    Current Outpatient Medications on File Prior to Visit  Medication Sig Dispense Refill   aspirin EC 81 MG tablet Take by mouth.     atorvastatin (LIPITOR) 40 MG tablet      Cholecalciferol 125 MCG (5000 UT) TABS Take by mouth.     Coenzyme Q10 100 MG capsule Take by mouth.     DOCOSAHEXAENOIC ACID PO Take by mouth.     Dutasteride-Tamsulosin HCl 0.5-0.4 MG CAPS Take by mouth.     gabapentin (NEURONTIN) 300 MG capsule Take 300 mg by mouth 3 (three) times daily.     lisinopril (PRINIVIL,ZESTRIL) 10 MG tablet       metFORMIN (GLUCOPHAGE) 500 MG tablet      metoprolol succinate (TOPROL-XL) 50 MG 24 hr tablet      vitamin B-12 (CYANOCOBALAMIN) 1000 MCG tablet Take by mouth.     meloxicam (MOBIC) 7.5 MG tablet Take 1 tablet (7.5 mg total) by mouth daily. (Patient not taking: Reported on 10/19/2020) 30 tablet  0   tamsulosin (FLOMAX) 0.4 MG CAPS capsule Take 0.4 mg by mouth. (Patient not taking: Reported on 10/19/2020)     No current facility-administered medications on file prior to visit.    There are no Patient Instructions on file for this visit. No follow-ups on file.   Kris Hartmann, NP

## 2021-01-17 ENCOUNTER — Other Ambulatory Visit (INDEPENDENT_AMBULATORY_CARE_PROVIDER_SITE_OTHER): Payer: Self-pay | Admitting: Nurse Practitioner

## 2021-01-17 DIAGNOSIS — I83813 Varicose veins of bilateral lower extremities with pain: Secondary | ICD-10-CM

## 2021-01-17 NOTE — Progress Notes (Signed)
MRN : [id number]  Eric Joyce is a 75 y.o. (10-31-1945) male who presents with chief complaint of varicose veins.  History of Present Illness:   The patient returns for followup evaluation 3 months after the initial visit. The patient does not have significant pain in the lower extremities with dependency.  Graduated compression stockings, Class I (20-30 mmHg), have been worn and the stockings do help. Over-the-counter analgesics do not improve the symptoms. The degree of discomfort does not interfere with daily activities.   Venous ultrasound shows normal deep venous system, no evidence of acute or chronic DVT.  Superficial reflux is not present.   No outpatient medications have been marked as taking for the 01/18/21 encounter (Appointment) with Delana Meyer, Dolores Lory, MD.    Past Medical History:  Diagnosis Date   Diabetes mellitus without complication ([place])    Hyperlipidemia    Hypertension    Myocardial infarction [facility])     Past Surgical History:  Procedure Laterality Date   APPENDECTOMY     CARDIAC SURGERY     2 stents   EYE SURGERY     REPLACEMENT TOTAL KNEE Right    TONSILLECTOMY AND ADENOIDECTOMY      Social History Social History   Tobacco Use   Smoking status: Former   Smokeless tobacco: Never  Substance Use Topics   Alcohol use: Not Currently   Drug use: Never    Family History Family History  Problem Relation Age of Onset   Lung cancer Mother    Heart disease Father    Diabetes Paternal Uncle    Diabetes Paternal Grandmother     No Known Allergies   REVIEW OF SYSTEMS (Negative unless checked)  Constitutional: [] Weight loss  [] Fever  [] Chills Cardiac: [] Chest pain   [] Chest pressure   [] Palpitations   [] Shortness of breath when laying flat   [] Shortness of breath with exertion. Vascular:  [] Pain in legs with walking   [] Pain in legs at rest  [] History of DVT   [] Phlebitis   [x] Swelling in legs   [x] Varicose veins   [] Non-healing  ulcers Pulmonary:   [] Uses home oxygen   [] Productive cough   [] Hemoptysis   [] Wheeze  [] COPD   [] Asthma Neurologic:  [] Dizziness   [] Seizures   [] History of stroke   [] History of TIA  [] Aphasia   [] Vissual changes   [] Weakness or numbness in arm   [] Weakness or numbness in leg Musculoskeletal:   [] Joint swelling   [] Joint pain   [] Low back pain Hematologic:  [] Easy bruising  [] Easy bleeding   [] Hypercoagulable state   [] Anemic Gastrointestinal:  [] Diarrhea   [] Vomiting  [] Gastroesophageal reflux/heartburn   [] Difficulty swallowing. Genitourinary:  [] Chronic kidney disease   [] Difficult urination  [] Frequent urination   [] Blood in urine Skin:  [] Rashes   [] Ulcers  Psychological:  [] History of anxiety   []  History of major depression.  Physical Examination  There were no vitals filed for this visit. There is no height or weight on file to calculate BMI. Gen: WD/WN, NAD Head: [place]/AT, No temporalis wasting.  Ear/Nose/Throat: Hearing grossly intact, nares w/o erythema or drainage, pinna without lesions Eyes: PER, EOMI, sclera nonicteric.  Neck: Supple, no gross masses.  No JVD.  Pulmonary:  Good air movement, no audible wheezing, no use of accessory muscles.  Cardiac: RRR, precordium not hyperdynamic. Vascular:  Large varicosities present right leg.  Mild venous stasis changes to the legs bilaterally.  1-2+ soft pitting edema  Vessel Right Left  Radial  Palpable Palpable  Gastrointestinal: soft, non-distended. No guarding/no peritoneal signs.  Musculoskeletal: M/S 5/5 throughout.  No deformity.  Neurologic: CN 2-12 intact. Pain and light touch intact in extremities.  Symmetrical.  Speech is fluent. Motor exam as listed above. Psychiatric: Judgment intact, Mood & affect appropriate for pt's clinical situation. Dermatologic: Venous rashes no ulcers noted.  No changes consistent with cellulitis. Lymph : No lichenification or skin changes of chronic lymphedema.  CBC Lab Results  Component  Value Date   WBC 6.8 04/27/2012   HGB 11.3 (L) 05/20/2012   HCT 45.9 04/27/2012   MCV 93 04/27/2012   PLT 131 (L) 05/20/2012    BMET    Component Value Date/Time   NA 139 03/25/2013 0404   K 3.7 03/25/2013 0404   CL 107 03/25/2013 0404   CO2 25 03/25/2013 0404   GLUCOSE 123 (H) 03/25/2013 0404   BUN 9 03/25/2013 0404   CREATININE 0.73 03/25/2013 0404   CALCIUM 8.7 03/25/2013 0404   GFRNONAA >60 03/25/2013 0404   GFRAA >60 03/25/2013 0404   CrCl cannot be calculated (Patient's most recent lab result is older than the maximum 21 days allowed.).  COAG Lab Results  Component Value Date   INR 1.0 04/27/2012    Radiology No results found.   Assessment/Plan 1. Asymptomatic varicose veins Recommend:  The patient is complaining of varicose veins.    I have had a long discussion with the patient regarding  varicose veins and why they cause symptoms.  Patient will begin wearing graduated compression stockings on a daily basis, beginning first thing in the morning and removing them in the evening. The patient is instructed specifically not to sleep in the stockings.    The patient  will also begin using over-the-counter analgesics such as Motrin 600 mg po TID to help control the symptoms as needed.    In addition, behavioral modification including elevation during the day will be initiated, utilizing a recliner was recommended.  The patient is also instructed to continue exercising such as walking 4-5 times per week.  At this time the patient wishes to continue conservative therapy and is not interested in more invasive treatments such as laser ablation and sclerotherapy.  The Patient will follow up PRN if the symptoms worsen.   2. Coronary artery disease involving native coronary artery of native heart with angina pectoris ([place]) Continue cardiac and antihypertensive medications as already ordered and reviewed, no changes at this time.  Continue statin as ordered and  reviewed, no changes at this time  Nitrates PRN for chest pain   3. Primary hypertension Continue antihypertensive medications as already ordered, these medications have been reviewed and there are no changes at this time.   4. Type 2 diabetes mellitus without complication, unspecified whether long term insulin use ([place]) Continue hypoglycemic medications as already ordered, these medications have been reviewed and there are no changes at this time.  Hgb A1C to be monitored as already arranged by primary service     Hortencia Pilar, MD  01/17/2021 8:22 AM

## 2021-01-18 ENCOUNTER — Other Ambulatory Visit: Payer: Self-pay

## 2021-01-18 ENCOUNTER — Ambulatory Visit (INDEPENDENT_AMBULATORY_CARE_PROVIDER_SITE_OTHER): Payer: PPO

## 2021-01-18 ENCOUNTER — Ambulatory Visit (INDEPENDENT_AMBULATORY_CARE_PROVIDER_SITE_OTHER): Payer: PPO | Admitting: Vascular Surgery

## 2021-01-18 VITALS — BP 120/72 | HR 61 | Ht 73.0 in | Wt 233.0 lb

## 2021-01-18 DIAGNOSIS — N200 Calculus of kidney: Secondary | ICD-10-CM | POA: Insufficient documentation

## 2021-01-18 DIAGNOSIS — I10 Essential (primary) hypertension: Secondary | ICD-10-CM

## 2021-01-18 DIAGNOSIS — R351 Nocturia: Secondary | ICD-10-CM | POA: Insufficient documentation

## 2021-01-18 DIAGNOSIS — I8390 Asymptomatic varicose veins of unspecified lower extremity: Secondary | ICD-10-CM

## 2021-01-18 DIAGNOSIS — L719 Rosacea, unspecified: Secondary | ICD-10-CM | POA: Insufficient documentation

## 2021-01-18 DIAGNOSIS — E119 Type 2 diabetes mellitus without complications: Secondary | ICD-10-CM | POA: Diagnosis not present

## 2021-01-18 DIAGNOSIS — Z961 Presence of intraocular lens: Secondary | ICD-10-CM | POA: Insufficient documentation

## 2021-01-18 DIAGNOSIS — E1140 Type 2 diabetes mellitus with diabetic neuropathy, unspecified: Secondary | ICD-10-CM | POA: Insufficient documentation

## 2021-01-18 DIAGNOSIS — B079 Viral wart, unspecified: Secondary | ICD-10-CM | POA: Insufficient documentation

## 2021-01-18 DIAGNOSIS — I25119 Atherosclerotic heart disease of native coronary artery with unspecified angina pectoris: Secondary | ICD-10-CM

## 2021-01-18 DIAGNOSIS — E8881 Metabolic syndrome: Secondary | ICD-10-CM | POA: Insufficient documentation

## 2021-01-18 DIAGNOSIS — Z85828 Personal history of other malignant neoplasm of skin: Secondary | ICD-10-CM | POA: Insufficient documentation

## 2021-01-18 DIAGNOSIS — F32A Depression, unspecified: Secondary | ICD-10-CM | POA: Insufficient documentation

## 2021-01-18 DIAGNOSIS — R140 Abdominal distension (gaseous): Secondary | ICD-10-CM | POA: Insufficient documentation

## 2021-01-18 DIAGNOSIS — L570 Actinic keratosis: Secondary | ICD-10-CM | POA: Insufficient documentation

## 2021-01-18 DIAGNOSIS — U071 COVID-19: Secondary | ICD-10-CM | POA: Insufficient documentation

## 2021-01-18 DIAGNOSIS — Z23 Encounter for immunization: Secondary | ICD-10-CM | POA: Insufficient documentation

## 2021-01-18 DIAGNOSIS — I83813 Varicose veins of bilateral lower extremities with pain: Secondary | ICD-10-CM | POA: Diagnosis not present

## 2021-01-18 DIAGNOSIS — L508 Other urticaria: Secondary | ICD-10-CM | POA: Insufficient documentation

## 2021-01-18 DIAGNOSIS — F4312 Post-traumatic stress disorder, chronic: Secondary | ICD-10-CM | POA: Insufficient documentation

## 2021-01-18 DIAGNOSIS — F1020 Alcohol dependence, uncomplicated: Secondary | ICD-10-CM | POA: Insufficient documentation

## 2021-01-18 DIAGNOSIS — M1990 Unspecified osteoarthritis, unspecified site: Secondary | ICD-10-CM | POA: Insufficient documentation

## 2021-01-18 DIAGNOSIS — C4491 Basal cell carcinoma of skin, unspecified: Secondary | ICD-10-CM | POA: Insufficient documentation

## 2021-01-18 DIAGNOSIS — L219 Seborrheic dermatitis, unspecified: Secondary | ICD-10-CM | POA: Insufficient documentation

## 2021-01-21 ENCOUNTER — Encounter (INDEPENDENT_AMBULATORY_CARE_PROVIDER_SITE_OTHER): Payer: Self-pay | Admitting: Vascular Surgery

## 2021-01-22 DIAGNOSIS — E119 Type 2 diabetes mellitus without complications: Secondary | ICD-10-CM | POA: Diagnosis not present

## 2021-01-22 DIAGNOSIS — E785 Hyperlipidemia, unspecified: Secondary | ICD-10-CM | POA: Diagnosis not present

## 2021-01-22 DIAGNOSIS — R350 Frequency of micturition: Secondary | ICD-10-CM | POA: Diagnosis not present

## 2021-01-22 DIAGNOSIS — I2510 Atherosclerotic heart disease of native coronary artery without angina pectoris: Secondary | ICD-10-CM | POA: Diagnosis not present

## 2021-01-22 DIAGNOSIS — Z0000 Encounter for general adult medical examination without abnormal findings: Secondary | ICD-10-CM | POA: Diagnosis not present

## 2021-01-22 DIAGNOSIS — F1020 Alcohol dependence, uncomplicated: Secondary | ICD-10-CM | POA: Diagnosis not present

## 2021-02-13 DIAGNOSIS — M79672 Pain in left foot: Secondary | ICD-10-CM | POA: Diagnosis not present

## 2021-02-13 DIAGNOSIS — E1142 Type 2 diabetes mellitus with diabetic polyneuropathy: Secondary | ICD-10-CM | POA: Diagnosis not present

## 2021-02-13 DIAGNOSIS — M79671 Pain in right foot: Secondary | ICD-10-CM | POA: Diagnosis not present

## 2021-02-15 ENCOUNTER — Ambulatory Visit (INDEPENDENT_AMBULATORY_CARE_PROVIDER_SITE_OTHER): Payer: PPO | Admitting: Urology

## 2021-02-15 ENCOUNTER — Encounter: Payer: Self-pay | Admitting: Urology

## 2021-02-15 ENCOUNTER — Other Ambulatory Visit: Payer: Self-pay

## 2021-02-15 VITALS — BP 137/79 | HR 56 | Ht 73.0 in | Wt 230.0 lb

## 2021-02-15 DIAGNOSIS — N401 Enlarged prostate with lower urinary tract symptoms: Secondary | ICD-10-CM | POA: Diagnosis not present

## 2021-02-15 DIAGNOSIS — N3281 Overactive bladder: Secondary | ICD-10-CM | POA: Diagnosis not present

## 2021-02-15 DIAGNOSIS — R350 Frequency of micturition: Secondary | ICD-10-CM | POA: Diagnosis not present

## 2021-02-15 DIAGNOSIS — N138 Other obstructive and reflux uropathy: Secondary | ICD-10-CM | POA: Diagnosis not present

## 2021-02-15 LAB — URINALYSIS, COMPLETE
Bilirubin, UA: NEGATIVE
Glucose, UA: NEGATIVE
Ketones, UA: NEGATIVE
Leukocytes,UA: NEGATIVE
Nitrite, UA: NEGATIVE
Protein,UA: NEGATIVE
RBC, UA: NEGATIVE
Specific Gravity, UA: 1.015 (ref 1.005–1.030)
Urobilinogen, Ur: 0.2 mg/dL (ref 0.2–1.0)
pH, UA: 7 (ref 5.0–7.5)

## 2021-02-15 LAB — MICROSCOPIC EXAMINATION
Bacteria, UA: NONE SEEN
Epithelial Cells (non renal): NONE SEEN /hpf (ref 0–10)
RBC, Urine: NONE SEEN /hpf (ref 0–2)

## 2021-02-15 LAB — BLADDER SCAN AMB NON-IMAGING

## 2021-02-15 MED ORDER — OXYBUTYNIN CHLORIDE ER 10 MG PO TB24: 10.0000 mg | ORAL_TABLET | Freq: Every day | ORAL | 11 refills | Status: DC

## 2021-02-15 NOTE — Progress Notes (Signed)
02/15/21 10:24 AM   Alfredo Batty Wiersma 02-23-1946 [id number]  CC: BPH, OAB, lower urinary tract symptoms, nocturia  HPI: I saw Mr. Letterman today for the above issues.  He is a 75 year old male who has been getting care at the [place], as well as with Dr. Eliberto Ivory.  He reportedly underwent a UroLift around 2020 for urinary symptoms of urgency and frequency, which has worsened his symptoms since that time.  He previously was on maximal medical therapy with Flomax and finasteride, but has been off those medications since surgery.  He reportedly has had PSA screening through the [place] which has been normal.  He drinks 1 Coke zero and tea during the day.  His primary complaints are urinary frequency and some urgency, as well as nocturia 1-3 times at night.  His PCP started him on oxybutynin 5 mg 3 times daily, which she does think has helped with his frequency.  He denies any gross hematuria, UTIs, or dysuria.   Urinalysis today is pending, PVR is normal at 0 mL.   PMH: Past Medical History:  Diagnosis Date   Diabetes mellitus without complication ([place])    Hyperlipidemia    Hypertension    Myocardial infarction [facility])     Surgical History: Past Surgical History:  Procedure Laterality Date   APPENDECTOMY     CARDIAC SURGERY     2 stents   EYE SURGERY     REPLACEMENT TOTAL KNEE Right    TONSILLECTOMY AND ADENOIDECTOMY       Family History: Family History  Problem Relation Age of Onset   Lung cancer Mother    Heart disease Father    Diabetes Paternal Uncle    Diabetes Paternal Grandmother     Social History:  reports that he has quit smoking. He has never used smokeless tobacco. He reports that he does not currently use alcohol. He reports that he does not use drugs.  Physical Exam: BP 137/79    Pulse (!) 56    Ht 6\' 1"  (1.854 m)    Wt 230 lb (104.3 kg)    BMI 30.34 kg/m    Constitutional:  Alert and oriented, No acute distress. Cardiovascular: No clubbing, cyanosis, or  edema. Respiratory: Normal respiratory effort, no increased work of breathing. GI: Abdomen is soft, nontender, nondistended, no abdominal masses GU: Uncircumcised phallus with patent meatus, no lesions, testicles descended and 20 cc bilaterally DRE: 40 g, smooth, no nodules or masses  Laboratory Data: Urinalysis pending  Pertinent Imaging: None to review  Assessment & Plan:   75 year old male with history of BPH/OAB who underwent a UroLift with Dr. Eliberto Ivory in 2020 with worsening of his urinary symptoms.  His primary urinary complaints today are urgency and frequency, as well as nocturia.  He had some improvement on 5 mg oxybutynin immediate release from PCP.  Urinalysis is pending, and PVR is normal at 0 mL.  I think his symptoms are more related to overactive bladder, and I recommended a trial of 10 mg oxybutynin XL, and we discussed behavioral strategies at length including avoiding bladder irritants and minimizing fluids before bedtime.  Will also request urology records from the [place] and Dr. Eliberto Ivory.  Call with urinalysis results.  Consider cystoscopy in the future if microscopic hematuria or worsening urinary symptoms with his history of UroLift.  Trial of oxybutynin 10 mg XL RTC 1 year PVR and symptom check, sooner if problems  Nickolas Madrid, MD 02/15/2021  [facility] [address], [address]  [place], [place] [address] [phone]

## 2021-02-15 NOTE — Patient Instructions (Signed)

## 2021-03-16 DIAGNOSIS — R059 Cough, unspecified: Secondary | ICD-10-CM | POA: Diagnosis not present

## 2021-03-16 DIAGNOSIS — R509 Fever, unspecified: Secondary | ICD-10-CM | POA: Diagnosis not present

## 2021-03-28 DIAGNOSIS — J069 Acute upper respiratory infection, unspecified: Secondary | ICD-10-CM | POA: Diagnosis not present

## 2021-04-04 ENCOUNTER — Emergency Department: Payer: PPO

## 2021-04-04 ENCOUNTER — Other Ambulatory Visit: Payer: Self-pay

## 2021-04-04 ENCOUNTER — Inpatient Hospital Stay
Admission: EM | Admit: 2021-04-04 | Discharge: 2021-04-12 | DRG: 853 | Disposition: A | Discharge status: Home Health | Payer: PPO | Attending: Internal Medicine | Admitting: Internal Medicine

## 2021-04-04 ENCOUNTER — Inpatient Hospital Stay: Payer: PPO

## 2021-04-04 DIAGNOSIS — I10 Essential (primary) hypertension: Secondary | ICD-10-CM | POA: Diagnosis not present

## 2021-04-04 DIAGNOSIS — R531 Weakness: Secondary | ICD-10-CM

## 2021-04-04 DIAGNOSIS — A419 Sepsis, unspecified organism: Secondary | ICD-10-CM | POA: Diagnosis not present

## 2021-04-04 DIAGNOSIS — R0602 Shortness of breath: Secondary | ICD-10-CM | POA: Diagnosis present

## 2021-04-04 DIAGNOSIS — Z79899 Other long term (current) drug therapy: Secondary | ICD-10-CM

## 2021-04-04 DIAGNOSIS — K611 Rectal abscess: Secondary | ICD-10-CM

## 2021-04-04 DIAGNOSIS — D72819 Decreased white blood cell count, unspecified: Secondary | ICD-10-CM | POA: Diagnosis not present

## 2021-04-04 DIAGNOSIS — I2699 Other pulmonary embolism without acute cor pulmonale: Secondary | ICD-10-CM | POA: Diagnosis present

## 2021-04-04 DIAGNOSIS — K921 Melena: Secondary | ICD-10-CM | POA: Diagnosis present

## 2021-04-04 DIAGNOSIS — Z86711 Personal history of pulmonary embolism: Secondary | ICD-10-CM | POA: Diagnosis not present

## 2021-04-04 DIAGNOSIS — Z85828 Personal history of other malignant neoplasm of skin: Secondary | ICD-10-CM

## 2021-04-04 DIAGNOSIS — R0989 Other specified symptoms and signs involving the circulatory and respiratory systems: Secondary | ICD-10-CM | POA: Diagnosis not present

## 2021-04-04 DIAGNOSIS — R000 Tachycardia, unspecified: Secondary | ICD-10-CM | POA: Diagnosis not present

## 2021-04-04 DIAGNOSIS — R509 Fever, unspecified: Secondary | ICD-10-CM | POA: Diagnosis not present

## 2021-04-04 DIAGNOSIS — E1165 Type 2 diabetes mellitus with hyperglycemia: Secondary | ICD-10-CM | POA: Diagnosis not present

## 2021-04-04 DIAGNOSIS — Z7982 Long term (current) use of aspirin: Secondary | ICD-10-CM

## 2021-04-04 DIAGNOSIS — R6883 Chills (without fever): Secondary | ICD-10-CM | POA: Diagnosis not present

## 2021-04-04 DIAGNOSIS — I2510 Atherosclerotic heart disease of native coronary artery without angina pectoris: Secondary | ICD-10-CM | POA: Diagnosis not present

## 2021-04-04 DIAGNOSIS — Z8616 Personal history of COVID-19: Secondary | ICD-10-CM | POA: Diagnosis not present

## 2021-04-04 DIAGNOSIS — Z87891 Personal history of nicotine dependence: Secondary | ICD-10-CM

## 2021-04-04 DIAGNOSIS — K612 Anorectal abscess: Secondary | ICD-10-CM | POA: Diagnosis present

## 2021-04-04 DIAGNOSIS — Z833 Family history of diabetes mellitus: Secondary | ICD-10-CM

## 2021-04-04 DIAGNOSIS — Z20822 Contact with and (suspected) exposure to covid-19: Secondary | ICD-10-CM | POA: Diagnosis not present

## 2021-04-04 DIAGNOSIS — D696 Thrombocytopenia, unspecified: Secondary | ICD-10-CM | POA: Diagnosis present

## 2021-04-04 DIAGNOSIS — R609 Edema, unspecified: Secondary | ICD-10-CM | POA: Diagnosis not present

## 2021-04-04 DIAGNOSIS — N281 Cyst of kidney, acquired: Secondary | ICD-10-CM | POA: Diagnosis not present

## 2021-04-04 DIAGNOSIS — D708 Other neutropenia: Secondary | ICD-10-CM | POA: Diagnosis not present

## 2021-04-04 DIAGNOSIS — D649 Anemia, unspecified: Secondary | ICD-10-CM | POA: Diagnosis not present

## 2021-04-04 DIAGNOSIS — K429 Umbilical hernia without obstruction or gangrene: Secondary | ICD-10-CM | POA: Diagnosis not present

## 2021-04-04 DIAGNOSIS — Z955 Presence of coronary angioplasty implant and graft: Secondary | ICD-10-CM

## 2021-04-04 DIAGNOSIS — R6884 Jaw pain: Secondary | ICD-10-CM | POA: Diagnosis present

## 2021-04-04 DIAGNOSIS — Z8249 Family history of ischemic heart disease and other diseases of the circulatory system: Secondary | ICD-10-CM | POA: Diagnosis not present

## 2021-04-04 DIAGNOSIS — R059 Cough, unspecified: Secondary | ICD-10-CM | POA: Diagnosis not present

## 2021-04-04 DIAGNOSIS — D709 Neutropenia, unspecified: Secondary | ICD-10-CM | POA: Diagnosis not present

## 2021-04-04 DIAGNOSIS — I252 Old myocardial infarction: Secondary | ICD-10-CM

## 2021-04-04 DIAGNOSIS — E785 Hyperlipidemia, unspecified: Secondary | ICD-10-CM | POA: Diagnosis not present

## 2021-04-04 DIAGNOSIS — K6289 Other specified diseases of anus and rectum: Secondary | ICD-10-CM | POA: Diagnosis not present

## 2021-04-04 DIAGNOSIS — R7889 Finding of other specified substances, not normally found in blood: Secondary | ICD-10-CM | POA: Diagnosis not present

## 2021-04-04 DIAGNOSIS — Z96651 Presence of right artificial knee joint: Secondary | ICD-10-CM | POA: Diagnosis present

## 2021-04-04 DIAGNOSIS — R197 Diarrhea, unspecified: Secondary | ICD-10-CM | POA: Diagnosis not present

## 2021-04-04 DIAGNOSIS — I82811 Embolism and thrombosis of superficial veins of right lower extremities: Secondary | ICD-10-CM | POA: Diagnosis not present

## 2021-04-04 DIAGNOSIS — R161 Splenomegaly, not elsewhere classified: Secondary | ICD-10-CM | POA: Diagnosis not present

## 2021-04-04 DIAGNOSIS — K5730 Diverticulosis of large intestine without perforation or abscess without bleeding: Secondary | ICD-10-CM | POA: Diagnosis not present

## 2021-04-04 DIAGNOSIS — T380X5A Adverse effect of glucocorticoids and synthetic analogues, initial encounter: Secondary | ICD-10-CM | POA: Diagnosis present

## 2021-04-04 DIAGNOSIS — Z791 Long term (current) use of non-steroidal anti-inflammatories (NSAID): Secondary | ICD-10-CM

## 2021-04-04 DIAGNOSIS — D61818 Other pancytopenia: Secondary | ICD-10-CM | POA: Diagnosis not present

## 2021-04-04 DIAGNOSIS — K7689 Other specified diseases of liver: Secondary | ICD-10-CM | POA: Diagnosis present

## 2021-04-04 DIAGNOSIS — Z882 Allergy status to sulfonamides status: Secondary | ICD-10-CM

## 2021-04-04 DIAGNOSIS — F1020 Alcohol dependence, uncomplicated: Secondary | ICD-10-CM | POA: Diagnosis not present

## 2021-04-04 DIAGNOSIS — E1140 Type 2 diabetes mellitus with diabetic neuropathy, unspecified: Secondary | ICD-10-CM | POA: Diagnosis present

## 2021-04-04 DIAGNOSIS — E119 Type 2 diabetes mellitus without complications: Secondary | ICD-10-CM

## 2021-04-04 DIAGNOSIS — Z03818 Encounter for observation for suspected exposure to other biological agents ruled out: Secondary | ICD-10-CM | POA: Diagnosis not present

## 2021-04-04 DIAGNOSIS — R5381 Other malaise: Secondary | ICD-10-CM | POA: Diagnosis not present

## 2021-04-04 DIAGNOSIS — E871 Hypo-osmolality and hyponatremia: Secondary | ICD-10-CM | POA: Diagnosis not present

## 2021-04-04 DIAGNOSIS — R945 Abnormal results of liver function studies: Secondary | ICD-10-CM | POA: Diagnosis not present

## 2021-04-04 DIAGNOSIS — E872 Acidosis, unspecified: Secondary | ICD-10-CM | POA: Diagnosis present

## 2021-04-04 DIAGNOSIS — N200 Calculus of kidney: Secondary | ICD-10-CM | POA: Diagnosis not present

## 2021-04-04 DIAGNOSIS — Z7984 Long term (current) use of oral hypoglycemic drugs: Secondary | ICD-10-CM

## 2021-04-04 DIAGNOSIS — K219 Gastro-esophageal reflux disease without esophagitis: Secondary | ICD-10-CM | POA: Diagnosis present

## 2021-04-04 DIAGNOSIS — R066 Hiccough: Secondary | ICD-10-CM | POA: Diagnosis present

## 2021-04-04 LAB — CBC WITH DIFFERENTIAL/PLATELET
Abs Immature Granulocytes: 0.02 10*3/uL (ref 0.00–0.07)
Basophils Absolute: 0 10*3/uL (ref 0.0–0.1)
Basophils Relative: 1 %
Eosinophils Absolute: 0.2 10*3/uL (ref 0.0–0.5)
Eosinophils Relative: 13 %
HCT: 39.3 % (ref 39.0–52.0)
Hemoglobin: 13.7 g/dL (ref 13.0–17.0)
Immature Granulocytes: 2 %
Lymphocytes Relative: 60 %
Lymphs Abs: 0.7 10*3/uL (ref 0.7–4.0)
MCH: 30.9 pg (ref 26.0–34.0)
MCHC: 34.9 g/dL (ref 30.0–36.0)
MCV: 88.5 fL (ref 80.0–100.0)
Monocytes Absolute: 0.1 10*3/uL (ref 0.1–1.0)
Monocytes Relative: 7 %
Neutro Abs: 0.2 10*3/uL — CL (ref 1.7–7.7)
Neutrophils Relative %: 17 %
Platelets: 10 10*3/uL — CL (ref 150–400)
RBC: 4.44 MIL/uL (ref 4.22–5.81)
RDW: 13.5 % (ref 11.5–15.5)
Smear Review: DECREASED
WBC: 1.2 10*3/uL — CL (ref 4.0–10.5)
nRBC: 0 % (ref 0.0–0.2)

## 2021-04-04 LAB — HEPATIC FUNCTION PANEL
ALT: 174 U/L — ABNORMAL HIGH (ref 0–44)
AST: 128 U/L — ABNORMAL HIGH (ref 15–41)
Albumin: 2.4 g/dL — ABNORMAL LOW (ref 3.5–5.0)
Alkaline Phosphatase: 133 U/L — ABNORMAL HIGH (ref 38–126)
Bilirubin, Direct: 0.9 mg/dL — ABNORMAL HIGH (ref 0.0–0.2)
Indirect Bilirubin: 1.2 mg/dL — ABNORMAL HIGH (ref 0.3–0.9)
Total Bilirubin: 2.1 mg/dL — ABNORMAL HIGH (ref 0.3–1.2)
Total Protein: 5.9 g/dL — ABNORMAL LOW (ref 6.5–8.1)

## 2021-04-04 LAB — LACTIC ACID, PLASMA
Lactic Acid, Venous: 1.7 mmol/L (ref 0.5–1.9)
Lactic Acid, Venous: 2.4 mmol/L (ref 0.5–1.9)

## 2021-04-04 LAB — RETIC PANEL
Immature Retic Fract: 25.3 % — ABNORMAL HIGH (ref 2.3–15.9)
RBC.: 4.15 MIL/uL — ABNORMAL LOW (ref 4.22–5.81)
Retic Count, Absolute: 80.9 10*3/uL (ref 19.0–186.0)
Retic Ct Pct: 2 % (ref 0.4–3.1)
Reticulocyte Hemoglobin: 33.5 pg (ref >27.9)

## 2021-04-04 LAB — BASIC METABOLIC PANEL
Anion gap: 10 (ref 5–15)
BUN: 22 mg/dL (ref 8–23)
CO2: 24 mmol/L (ref 22–32)
Calcium: 8.8 mg/dL — ABNORMAL LOW (ref 8.9–10.3)
Chloride: 97 mmol/L — ABNORMAL LOW (ref 98–111)
Creatinine, Ser: 0.73 mg/dL (ref 0.61–1.24)
GFR, Estimated: >60 mL/min (ref >60)
Glucose, Bld: 202 mg/dL — ABNORMAL HIGH (ref 70–99)
Potassium: 3.7 mmol/L (ref 3.5–5.1)
Sodium: 131 mmol/L — ABNORMAL LOW (ref 135–145)

## 2021-04-04 LAB — D-DIMER, QUANTITATIVE: D-Dimer, Quant: 2.62 ug/mL-FEU — ABNORMAL HIGH (ref 0.00–0.50)

## 2021-04-04 LAB — LIPASE, BLOOD: Lipase: 30 U/L (ref 11–51)

## 2021-04-04 LAB — RESP PANEL BY RT-PCR (FLU A&B, COVID) ARPGX2
Influenza A by PCR: NEGATIVE
Influenza B by PCR: NEGATIVE
SARS Coronavirus 2 by RT PCR: NEGATIVE

## 2021-04-04 LAB — PHOSPHORUS: Phosphorus: 2.1 mg/dL — ABNORMAL LOW (ref 2.5–4.6)

## 2021-04-04 LAB — IMMATURE PLATELET FRACTION: Immature Platelet Fraction: 20.2 % — ABNORMAL HIGH (ref 1.2–8.6)

## 2021-04-04 LAB — APTT: aPTT: 28 seconds (ref 24–36)

## 2021-04-04 LAB — TROPONIN I (HIGH SENSITIVITY): Troponin I (High Sensitivity): 28 ng/L — ABNORMAL HIGH (ref <18)

## 2021-04-04 LAB — TYPE AND SCREEN
ABO/RH(D): O NEG
Antibody Screen: NEGATIVE

## 2021-04-04 LAB — PATHOLOGIST SMEAR REVIEW

## 2021-04-04 LAB — LACTATE DEHYDROGENASE: LDH: 233 U/L — ABNORMAL HIGH (ref 98–192)

## 2021-04-04 LAB — T4, FREE: Free T4: 1.29 ng/dL — ABNORMAL HIGH (ref 0.61–1.12)

## 2021-04-04 LAB — FOLATE: Folate: 17.4 ng/mL (ref >5.9)

## 2021-04-04 LAB — FIBRINOGEN: Fibrinogen: 462 mg/dL (ref 210–475)

## 2021-04-04 LAB — MAGNESIUM: Magnesium: 1.7 mg/dL (ref 1.7–2.4)

## 2021-04-04 LAB — TSH: TSH: 0.952 u[IU]/mL (ref 0.350–4.500)

## 2021-04-04 LAB — PROCALCITONIN: Procalcitonin: 0.46 ng/mL

## 2021-04-04 MED ORDER — PIPERACILLIN-TAZOBACTAM 3.375 G IVPB
3.3750 g | Freq: Three times a day (TID) | INTRAVENOUS | Status: DC
  Administered 2021-04-04 – 2021-04-07 (×8): 3.375 g via INTRAVENOUS
  Filled 2021-04-04 (×8): qty 50

## 2021-04-04 MED ORDER — IOHEXOL 350 MG/ML SOLN
75.0000 mL | Freq: Once | INTRAVENOUS | Status: AC | PRN
  Administered 2021-04-04: 75 mL via INTRAVENOUS

## 2021-04-04 MED ORDER — SODIUM CHLORIDE 0.9 % IV SOLN: INTRAVENOUS | Status: AC

## 2021-04-04 MED ORDER — IOHEXOL 300 MG/ML  SOLN
100.0000 mL | Freq: Once | INTRAMUSCULAR | Status: AC | PRN
  Administered 2021-04-04: 100 mL via INTRAVENOUS

## 2021-04-04 MED ORDER — POTASSIUM PHOSPHATES 15 MMOLE/5ML IV SOLN
30.0000 mmol | Freq: Once | INTRAVENOUS | Status: AC
  Administered 2021-04-05: 30 mmol via INTRAVENOUS
  Filled 2021-04-04: qty 10

## 2021-04-04 MED ORDER — MAGNESIUM SULFATE IN D5W 1-5 GM/100ML-% IV SOLN
1.0000 g | Freq: Once | INTRAVENOUS | Status: AC
  Administered 2021-04-05: 1 g via INTRAVENOUS
  Filled 2021-04-04 (×2): qty 100

## 2021-04-04 MED ORDER — HYDRALAZINE HCL 20 MG/ML IJ SOLN: 10.0000 mg | Freq: Four times a day (QID) | INTRAMUSCULAR | Status: DC | PRN

## 2021-04-04 MED ORDER — MAGNESIUM SULFATE 50 % IJ SOLN
1.0000 g | Freq: Once | INTRAMUSCULAR | Status: DC
Start: 2021-04-04 — End: 2021-04-04

## 2021-04-04 NOTE — ED Notes (Signed)
Pt c/o weakness, all over body aches and fever X1 month. Pt was seen by [facility] X[facility] weeks ago and given antibiotic that caused patient diarrhea.

## 2021-04-04 NOTE — ED Provider Notes (Signed)
[facility] Provider Note    Event Date/Time   First MD Initiated Contact with Patient 04/04/21 1532     (approximate)   History   Weakness   HPI  Eric Joyce is a 76 y.o. male here with generalized weakness.  The patient states that for the last 2.5 weeks, he has had daily fevers, chills, body aches, and general fatigue.  He was seen at an urgent care at the beginning of his symptoms and prescribed amoxicillin which he took but this had no improvement in his symptoms.  He has had loose, watery, dark stools over the last 2 weeks, though initially thought this was just due to the antibiotic.  He has had increasing weakness, nightly sweats and fevers, as well as diffuse body and bone aches.  Over the last several days, he has also noticed increasing pain in his jaw despite having no trauma or dental pain.  Denies any     Physical Exam   Triage Vital Signs: ED Triage Vitals [04/04/21 1503]  Enc Vitals Group     BP 122/60     Pulse Rate (!) 111     Resp 18     Temp 98 F (36.7 C)     Temp src      SpO2 98 %     Weight      Height      Head Circumference      Peak Flow      Pain Score      Pain Loc      Pain Edu?      Excl. in [place]?     Most recent vital signs: Vitals:   04/05/21 0029 04/05/21 0100  BP: 113/71 (!) 110/96  Pulse: 94 100  Resp: 17   Temp: 98 F (36.7 C)   SpO2: 97% 96%     General: Awake, no distress.  Appears to feel unwell but is nontoxic. CV:  Good peripheral perfusion.  Slight tachycardia but rate appears regular.  No appreciable murmurs. Resp:  Normal effort.  Normal work of breathing.  Lungs clear to auscultation bilaterally. Abd:  No distention.  No tenderness.  No rebound or guarding. Other:  No appreciable lymphadenopathy.   ED Results / Procedures / Treatments   Labs (all labs ordered are listed, but only abnormal results are displayed) Labs Reviewed  BASIC METABOLIC PANEL - Abnormal; Notable for the  following components:      Result Value   Sodium 131 (*)    Chloride 97 (*)    Glucose, Bld 202 (*)    Calcium 8.8 (*)    All other components within normal limits  CBC WITH DIFFERENTIAL/PLATELET - Abnormal; Notable for the following components:   WBC 1.2 (*)    Platelets 10 (*)    Neutro Abs 0.2 (*)    All other components within normal limits  LACTIC ACID, PLASMA - Abnormal; Notable for the following components:   Lactic Acid, Venous 2.4 (*)    All other components within normal limits  D-DIMER, QUANTITATIVE (NOT AT [facility]) - Abnormal; Notable for the following components:   D-Dimer, Quant 2.62 (*)    All other components within normal limits  LACTATE DEHYDROGENASE - Abnormal; Notable for the following components:   LDH 233 (*)    All other components within normal limits  HEPATIC FUNCTION PANEL - Abnormal; Notable for the following components:   Total Protein 5.9 (*)    Albumin 2.4 (*)  AST 128 (*)    ALT 174 (*)    Alkaline Phosphatase 133 (*)    Total Bilirubin 2.1 (*)    Bilirubin, Direct 0.9 (*)    Indirect Bilirubin 1.2 (*)    All other components within normal limits  IMMATURE PLATELET FRACTION - Abnormal; Notable for the following components:   Immature Platelet Fraction 20.2 (*)    All other components within normal limits  RETIC PANEL - Abnormal; Notable for the following components:   RBC. 4.15 (*)    Immature Retic Fract 25.3 (*)    All other components within normal limits  PHOSPHORUS - Abnormal; Notable for the following components:   Phosphorus 2.1 (*)    All other components within normal limits  T4, FREE - Abnormal; Notable for the following components:   Free T4 1.29 (*)    All other components within normal limits  CBC WITH DIFFERENTIAL/PLATELET - Abnormal; Notable for the following components:   WBC 1.3 (*)    HCT 37.7 (*)    Platelets 7 (*)    Neutro Abs 0.2 (*)    All other components within normal limits  TROPONIN I (HIGH SENSITIVITY) -  Abnormal; Notable for the following components:   Troponin I (High Sensitivity) 28 (*)    All other components within normal limits  RESP PANEL BY RT-PCR (FLU A&B, COVID) [id number]  GASTROINTESTINAL PANEL BY PCR, STOOL (REPLACES STOOL CULTURE)  CULTURE, BLOOD (ROUTINE X 2)  CULTURE, BLOOD (ROUTINE X 2)  LACTIC ACID, PLASMA  PATHOLOGIST SMEAR REVIEW  APTT  FIBRINOGEN  FOLATE  LIPASE, BLOOD  PROCALCITONIN  MAGNESIUM  TSH  BRAIN NATRIURETIC PEPTIDE  VITAMIN B12  COMP PANEL: LEUKEMIA/LYMPHOMA  CK  GAMMA GT  CORTISOL  BLOOD GAS, VENOUS  EPSTEIN-BARR VIRUS (EBV) ANTIBODY PROFILE  HEPATITIS PANEL, ACUTE  HIV ANTIBODY (ROUTINE TESTING W REFLEX)  AMMONIA  CBC WITH DIFFERENTIAL/PLATELET  LYME DISEASE SEROLOGY W/REFLEX  ANA W/REFLEX  C-REACTIVE PROTEIN  HEMOGLOBIN A1C  URINALYSIS, ROUTINE W REFLEX MICROSCOPIC  ETHANOL  CBC WITH DIFFERENTIAL/PLATELET  CBC WITH DIFFERENTIAL/PLATELET  CBC WITH DIFFERENTIAL/PLATELET  CBC WITH DIFFERENTIAL/PLATELET  CBC WITH DIFFERENTIAL/PLATELET  CBC WITH DIFFERENTIAL/PLATELET  CBG MONITORING, ED  TYPE AND SCREEN  TROPONIN I (HIGH SENSITIVITY)     EKG Sinus tachycardia, tracheally 112.  PR 210, QRS 90, QTc 521.  Nonspecific T wave inversions, particularly in the inferolateral leads.  No ST elevations.   RADIOLOGY CXR: Clear CT Max/Face: possible dental infection but no abscess, no mass/lesion near TMJs CT A/P: Proctitis with possible small perirectal abscess, no free air   I also independently reviewed and agree wit radiologist interpretations.   PROCEDURES:  Critical Care performed: Yes, see critical care procedure note(s)  .Critical Care Performed by: Duffy Bruce, MD Authorized by: Duffy Bruce, MD   Critical care provider statement:    Critical care time (minutes):  30   Critical care time was exclusive of:  Separately billable procedures and treating other patients   Critical care was necessary to treat or prevent  imminent or life-threatening deterioration of the following conditions:  Cardiac failure, circulatory failure, respiratory failure and sepsis   Critical care was time spent personally by me on the following activities:  Development of treatment plan with patient or surrogate, discussions with consultants, evaluation of patient's response to treatment, examination of patient, ordering and review of laboratory studies, ordering and review of radiographic studies, ordering and performing treatments and interventions, pulse oximetry, re-evaluation of patient's condition and review of  old charts    MEDICATIONS ORDERED IN ED: Medications  piperacillin-tazobactam (ZOSYN) IVPB 3.375 g (0 g Intravenous Stopped 04/04/21 2355)  potassium PHOSPHATE 30 mmol in dextrose 5 % 500 mL infusion (30 mmol Intravenous New Bag/Given 04/05/21 0036)  0.9 %  sodium chloride infusion ( Intravenous New Bag/Given 04/05/21 0027)  magnesium sulfate IVPB 1 g 100 mL (has no administration in time range)  hydrALAZINE (APRESOLINE) injection 10 mg (has no administration in time range)  insulin aspart (novoLOG) injection 0-9 Units (has no administration in time range)  iohexol (OMNIPAQUE) 300 MG/ML solution 100 mL (100 mLs Intravenous Contrast Given 04/04/21 1709)  iohexol (OMNIPAQUE) 350 MG/ML injection 75 mL (75 mLs Intravenous Contrast Given 04/04/21 2205)     IMPRESSION / MDM / ASSESSMENT AND PLAN / ED COURSE  I reviewed the triage vital signs and the nursing notes.                               The patient is on the cardiac monitor to evaluate for evidence of arrhythmia and/or significant heart rate changes.   MDM:  76 yo M with PMHx DM, here with fever, night sweats, and fatigue. Reviewed notes from Dr. Reuel Boom PCP visit, which note significant leukopenia, thrombocytopenia, general fatigue of unknown source. Concern for possible myelodysplasia, leukemia, marrow suppression from possible infection, less likely TTP.  Labs  repeated here, confirm significant leukopenia with neutropenia, thrombocytopenia but normal Hgb. BMP shows no evidence of AKI. LA normal. LFTs mildly elevated, however, and bili elevated at 2.1, indirect 1.2. LDH 233. No schistocytes noted on smear. CT a/p obtained given his abd pain, and shows possible proctitis w perirectal absces, though this is small. Discussed with Dr. Christian Mate of surgery, recommends IV ABX, medical management at this time. Oncology Dr. Tasia Catchings consulted as well, has seen pt and recommends admission for further w/u.   Unclear etiology for pt's new leukopenia, thrombocytopenia. Pt does have elevated LDH and bili concerning for hemolysis, but normal renal function, no anemia making TTP less likely. Broad-spectrum ABX started for possible dental infection and proctitis with leukopenia, IVF given, and will admit ot medicine.    MEDICATIONS GIVEN IN ED: Medications  piperacillin-tazobactam (ZOSYN) IVPB 3.375 g (0 g Intravenous Stopped 04/04/21 2355)  potassium PHOSPHATE 30 mmol in dextrose 5 % 500 mL infusion (30 mmol Intravenous New Bag/Given 04/05/21 0036)  0.9 %  sodium chloride infusion ( Intravenous New Bag/Given 04/05/21 0027)  magnesium sulfate IVPB 1 g 100 mL (has no administration in time range)  hydrALAZINE (APRESOLINE) injection 10 mg (has no administration in time range)  insulin aspart (novoLOG) injection 0-9 Units (has no administration in time range)  iohexol (OMNIPAQUE) 300 MG/ML solution 100 mL (100 mLs Intravenous Contrast Given 04/04/21 1709)  iohexol (OMNIPAQUE) 350 MG/ML injection 75 mL (75 mLs Intravenous Contrast Given 04/04/21 2205)     Consults:  Dr. Tasia Catchings with Oncology Dr. Christian Mate with Generally surgery Dr. Posey Pronto with Hospitalist service   EMR reviewed  PCP notes, including visit today with outside lab work leading to ED visit     FINAL CLINICAL IMPRESSION(S) / ED DIAGNOSES   Final diagnoses:  Other neutropenia ([place])  Thrombocytopenia ([place])  Proctitis      Rx / DC Orders   ED Discharge Orders     None        Note:  This document was prepared using Dragon voice recognition software and may include unintentional  dictation errors.   Duffy Bruce, MD 04/05/21 [phone]

## 2021-04-04 NOTE — Consult Note (Signed)
Hematology/Oncology Consult note [phone] [phone]  Patient Care Team: [place] as PCP - General (General Practice)   Name of the patient: Eric Joyce  [id number]  1945-05-05   Date of visit: 04/04/21 REASON FOR COSULTATION:  Leukopenia, thrombocytopenia History of presenting illness-  76 y.o. male with PMH listed at below who presents to ER for evaluation of generalized weakness, fever, body aches for the past 3 weeks. He presented to the urgent care about 3 weeks ago when he first noticed the symptoms and was prescribed amoxicillin.  He took amoxicillin with no significant improvement of his symptoms and also started to have multiple bouts of watery diarrhea.  He has also lost 10-20 pounds within recent few weeks. He denies nausea vomiting, chest pain, cough, nasal congestion.  He has been tested negative for influenza and COVID-19 infection. In the emergency room, he is tachycardic with a heart rate of 111, BP 122/60, temperature 98 respiratory 18. At presentation, CBC showed WBC of 1.2, platelet count 10,000, hemoglobin of 13.7.  Predominantly neutropenia with ANC 0.2. He has normal kidney function of 0.73, AST 122, ALT 174, alkaline phosphatase 133.  Total bilirubin is 2.1, indirect bilirubin 1.2, bilirubin direct 0.9.  Fibrinogen 462, lactic acid 1.7, LDH was elevated 233, Hematology oncology was consulted for leukopenia and thrombocytopenia. Patient denies alcohol use.  He has not noticed any easy bruising or active bleeding except that he reports stool being dark.   Review of Systems  Constitutional:  Positive for appetite change, fatigue and unexpected weight change.  Respiratory:  Negative for cough and shortness of breath.   Gastrointestinal:  Positive for diarrhea.  Genitourinary:  Negative for dysuria and frequency.   Musculoskeletal:  Negative for back pain.       Generalized body aches  Skin:  Negative for itching and rash.   Neurological:  Negative for light-headedness.  Hematological:  Negative for adenopathy. Does not bruise/bleed easily.  Psychiatric/Behavioral:  Negative for confusion.    No Known Allergies  Patient Active Problem List   Diagnosis Date Noted   Abdominal distension (gaseous) 01/18/2021   Actinic keratosis 01/18/2021   Alcoholism ([place]) 01/18/2021   Basal cell carcinoma 01/18/2021   Chronic post-traumatic stress disorder 01/18/2021   COVID-19 01/18/2021   Depression 01/18/2021   Diabetes mellitus with neuropathy ([place]) 01/18/2021   Encounter for immunization 01/18/2021   History of other malignant neoplasm of skin 54/00/8676   Metabolic syndrome 19/50/9326   Nephrolithiasis 01/18/2021   Nocturia 01/18/2021   Osteoarthrosis 01/18/2021   Other urticaria 01/18/2021   Presence of intraocular lens 01/18/2021   Rosacea 01/18/2021   Seborrheic dermatitis 01/18/2021   Viral warts 01/18/2021   Asymptomatic varicose veins 10/19/2020   CAD (coronary artery disease) 10/19/2020   Diabetes mellitus type 2, uncomplicated ([place]) 71/24/5809   Hypertensive disorder 10/19/2020   Class 1 obesity due to excess calories without serious comorbidity with body mass index (BMI) of 30.0 to 30.9 in adult 10/04/2020   Diabetic neuropathy ([place]) 04/13/2020   Hav (hallux abducto valgus), unspecified laterality 04/13/2020   Other specified postprocedural states 01/18/2014   Myocardial infarction ([place]) 06/29/2002   Hyperlipidemia 03/04/2002   Insomnia 03/04/1968     Past Medical History:  Diagnosis Date   Diabetes mellitus without complication ([place])    Hyperlipidemia    Hypertension    Myocardial infarction [facility])      Past Surgical History:  Procedure Laterality Date   APPENDECTOMY     CARDIAC SURGERY  2 stents   EYE SURGERY     REPLACEMENT TOTAL KNEE Right    TONSILLECTOMY AND ADENOIDECTOMY      Social History   Socioeconomic History   Marital status: Single    Spouse name: Not on file    Number of children: Not on file   Years of education: Not on file   Highest education level: Not on file  Occupational History   Not on file  Tobacco Use   Smoking status: Former   Smokeless tobacco: Never  Substance and Sexual Activity   Alcohol use: Not Currently   Drug use: Never   Sexual activity: Not on file  Other Topics Concern   Not on file  Social History Narrative   Not on file   Social Determinants of Health   Financial Resource Strain: Not on file  Food Insecurity: Not on file  Transportation Needs: Not on file  Physical Activity: Not on file  Stress: Not on file  Social Connections: Not on file  Intimate Partner Violence: Not on file     Family History  Problem Relation Age of Onset   Lung cancer Mother    Heart disease Father    Diabetes Paternal Uncle    Diabetes Paternal Grandmother      Current Facility-Administered Medications:    piperacillin-tazobactam (ZOSYN) IVPB 3.375 g, 3.375 g, Intravenous, Q8H, Noralee Space, RPH  Current Outpatient Medications:    aspirin EC 81 MG tablet, Take by mouth., Disp: , Rfl:    atorvastatin (LIPITOR) 40 MG tablet, , Disp: , Rfl:    Cholecalciferol 125 MCG (5000 UT) TABS, Take by mouth., Disp: , Rfl:    Coenzyme Q10 100 MG capsule, Take by mouth., Disp: , Rfl:    DOCOSAHEXAENOIC ACID PO, Take by mouth., Disp: , Rfl:    gabapentin (NEURONTIN) 300 MG capsule, Take 300 mg by mouth 3 (three) times daily., Disp: , Rfl:    lisinopril (PRINIVIL,ZESTRIL) 10 MG tablet, , Disp: , Rfl:    meloxicam (MOBIC) 7.5 MG tablet, Take 1 tablet (7.5 mg total) by mouth daily., Disp: 30 tablet, Rfl: 0   metFORMIN (GLUCOPHAGE) 500 MG tablet, , Disp: , Rfl:    metoprolol succinate (TOPROL-XL) 50 MG 24 hr tablet, , Disp: , Rfl:    oxybutynin (DITROPAN-XL) 10 MG 24 hr tablet, Take 1 tablet (10 mg total) by mouth daily., Disp: 30 tablet, Rfl: 11   vitamin B-12 (CYANOCOBALAMIN) 1000 MCG tablet, Take by mouth., Disp: , Rfl:     Physical exam:  Vitals:   04/04/21 1503 04/04/21 1530 04/04/21 1600  BP: 122/60 121/73 123/68  Pulse: (!) 111 (!) 106 (!) 102  Resp: 18 18 15   Temp: 98 F (36.7 C)    SpO2: 98% 98% 98%   Physical Exam Constitutional:      Appearance: He is not diaphoretic.  HENT:     Head: Normocephalic and atraumatic.     Nose: Nose normal.     Mouth/Throat:     Pharynx: No oropharyngeal exudate.  Eyes:     General: No scleral icterus.    Pupils: Pupils are equal, round, and reactive to light.  Cardiovascular:     Rate and Rhythm: Regular rhythm. Tachycardia present.     Heart sounds: No murmur heard. Pulmonary:     Effort: Pulmonary effort is normal. No respiratory distress.     Breath sounds: No rales.  Chest:     Chest wall: No tenderness.  Abdominal:  General: There is no distension.     Palpations: Abdomen is soft.     Tenderness: There is no abdominal tenderness.  Musculoskeletal:        General: Normal range of motion.     Cervical back: Normal range of motion and neck supple.  Lymphadenopathy:     Cervical: No cervical adenopathy.  Skin:    General: Skin is warm and dry.     Findings: No bruising or erythema.  Neurological:     Mental Status: He is alert and oriented to person, place, and time.     Cranial Nerves: No cranial nerve deficit.     Motor: No abnormal muscle tone.     Coordination: Coordination normal.  Psychiatric:        Mood and Affect: Mood and affect normal.        CMP Latest Ref Rng & Units 04/04/2021  Glucose 70 - 99 mg/dL 202(H)  BUN 8 - 23 mg/dL 22  Creatinine 0.61 - 1.24 mg/dL 0.73  Sodium 135 - 145 mmol/L 131(L)  Potassium 3.5 - 5.1 mmol/L 3.7  Chloride 98 - 111 mmol/L 97(L)  CO2 22 - 32 mmol/L 24  Calcium 8.9 - 10.3 mg/dL 8.8(L)  Total Protein 6.5 - 8.1 g/dL 5.9(L)  Total Bilirubin 0.3 - 1.2 mg/dL 2.1(H)  Alkaline Phos 38 - 126 U/L 133(H)  AST 15 - 41 U/L 128(H)  ALT 0 - 44 U/L 174(H)   CBC Latest Ref Rng & Units 04/04/2021  WBC  4.0 - 10.5 K/uL 1.2(LL)  Hemoglobin 13.0 - 17.0 g/dL 13.7  Hematocrit 39.0 - 52.0 % 39.3  Platelets 150 - 400 K/uL 10(LL)    RADIOGRAPHIC STUDIES: I have personally reviewed the radiological images as listed and agreed with the findings in the report. DG Chest 2 View  Result Date: 04/04/2021 CLINICAL DATA:  Generalized weakness, fever and chills for more than 3 weeks. Black stool and copious diarrhea. EXAM: CHEST - 2 VIEW COMPARISON:  None. FINDINGS: Normal sized heart. Tortuous aorta. Clear lungs. Thoracic spine degenerative changes. IMPRESSION: No acute abnormality. Electronically Signed   By: Claudie Revering M.D.   On: 04/04/2021 17:06   CT ABDOMEN PELVIS W CONTRAST  Result Date: 04/04/2021 CLINICAL DATA:  Weakness, fever, chills for 3 weeks, dark stool, diarrhea EXAM: CT ABDOMEN AND PELVIS WITH CONTRAST TECHNIQUE: Multidetector CT imaging of the abdomen and pelvis was performed using the standard protocol following bolus administration of intravenous contrast. RADIATION DOSE REDUCTION: This exam was performed according to the departmental dose-optimization program which includes automated exposure control, adjustment of the mA and/or kV according to patient size and/or use of iterative reconstruction technique. CONTRAST:  184mL OMNIPAQUE IOHEXOL 300 MG/ML  SOLN COMPARISON:  None. FINDINGS: Lower chest: No acute pleural or parenchymal lung disease. Hepatobiliary: No focal liver abnormality is seen. No gallstones, gallbladder wall thickening, or biliary dilatation. Pancreas: Unremarkable. No pancreatic ductal dilatation or surrounding inflammatory changes. Spleen: The spleen is enlarged measuring 15.5 x 15.0 x 5.7 cm. No focal parenchymal abnormalities. Adrenals/Urinary Tract: There are 2 sub 5 mm nonobstructing calculi within the lower pole left kidney. No right-sided calculi. Small bilateral renal cortical cysts. No obstructive uropathy. Bladder is unremarkable. The adrenals are normal. Stomach/Bowel:  No bowel obstruction or ileus. Scattered diverticulosis of the sigmoid colon without evidence of diverticulitis. Mild circumferential rectal wall thickening. There is a 1.7 x 1.2 cm low-attenuation region dorsal to the anal verge reference image 100/3, which could reflect a small perirectal abscess.  Please correlate with physical exam findings. No inflammatory changes. Vascular/Lymphatic: Aortic atherosclerosis. No enlarged abdominal or pelvic lymph nodes. Numerous subcentimeter mesenteric and retroperitoneal lymph nodes are nonspecific. Reproductive: The prostate is mildly enlarged. Surgical clips are seen within the ventral aspect of the prostate. Other: No free fluid or free intraperitoneal gas. There is a small fat containing umbilical hernia. No bowel herniation. Musculoskeletal: No acute or destructive bony lesions. Reconstructed images demonstrate no additional findings. IMPRESSION: 1. Mild nonspecific circumferential rectal wall thickening, with possible small perirectal abscess dorsal to the anal verge. Please correlate with physical exam findings. 2. Distal colonic diverticulosis without diverticulitis. 3. Nonobstructing less than 5 mm left renal calculi. 4. Splenomegaly. 5. Small fat containing umbilical hernia.  No bowel herniation. 6.  Aortic Atherosclerosis (ICD10-I70.0). Electronically Signed   By: Randa Ngo M.D.   On: 04/04/2021 17:50   CT Maxillofacial W Contrast  Result Date: 04/04/2021 CLINICAL DATA:  Bilateral TMJ with limited movement. Mouth pain. Possible malignancy. Fever and chills 3 weeks. EXAM: CT MAXILLOFACIAL WITH CONTRAST TECHNIQUE: Multidetector CT imaging of the maxillofacial structures was performed with intravenous contrast. Multiplanar CT image reconstructions were also generated. RADIATION DOSE REDUCTION: This exam was performed according to the departmental dose-optimization program which includes automated exposure control, adjustment of the mA and/or kV according to  patient size and/or use of iterative reconstruction technique. CONTRAST:  176mL OMNIPAQUE IOHEXOL 300 MG/ML  SOLN COMPARISON:  None. FINDINGS: Osseous: Normal TMJ bilaterally. Normal alignment and no degenerative change. No lesion in the mandible. Mild periapical lucency around upper incisors bilaterally. Orbits: Negative for orbital mass or edema. Bilateral cataract extraction. Sinuses: Mucosal edema filling much of the left sphenoid sinus with bony thickening of the left sphenoid sinus. Remaining sinuses clear. Mastoid and middle ear clear bilaterally. Soft tissues: No soft tissue mass. No evidence of cellulitis or soft tissue abscess. Submandibular parotid glands normal bilaterally. Normal pharynx. Limited thyroid negative Limited intracranial: No acute abnormality. IMPRESSION: Normal TMJ bilaterally.  No dislocation or degenerative change. Small periapical lucency around upper incisors bilaterally compatible with dental infection. No evidence of soft tissue cellulitis or soft tissue abscess. No evidence of mass or malignancy Mucosal edema and bony thickening left sphenoid sinus. Electronically Signed   By: Franchot Gallo M.D.   On: 04/04/2021 17:59    Assessment and plan-   #Thrombocytopenia  platelet count of 10,000, no recent level to compare with. He had a level of 131,000 on 05/20/2012 and 186,000 on 05/11/2014. Check smear-discussed with Dr. Doran Heater significant blast or schistocytes. Check LDH, reticulocyte panel, immature platelet fraction, vitamin B12, folate level.  Check hepatitis and HIV,  Probably secondary to sepsis, may have a component of peripheral platelet destruction due to splenomegaly. Please transfuse if platelet count drops below 10,000 or active bleeding.  #Leukopenia, predominantly neutropenia unknown baseline.  Likely secondary to acute infection.  Further work-up in process. #Severe diarrhea, Discussed with ER physician.  Agree with obtaining CT abdomen pelvis for  evaluation. Obtain GI panel  #Elevated LFT, check hepatitis panel.  #Splenomegaly, 15.5 x 15 x 5.7 cm. Etiology unknown.  Check peripheral blood flow cytometry, EBV panel .  This may contribute to his thrombocytopenia.   #CT showed rectal wall thickening with small perirectal abscess.  Obtain blood culture  patient will be admitted to medicine for further evaluation and treatment.  Thank you for allowing me to participate in the care of this patient.   Earlie Server, MD, PhD Hematology Oncology  04/04/2021

## 2021-04-04 NOTE — ED Notes (Signed)
Notified Dr. Ellender Hose of pt's lactic of 2.4.

## 2021-04-04 NOTE — H&P (Signed)
History and Physical    Patient: Eric Joyce YQM:[id number] DOB: 1945/11/25 DOA: 04/04/2021 DOS: the patient was seen and examined on 04/05/2021 PCP: [place]  Patient coming from: Home  Chief Complaint:  Chief Complaint  Patient presents with   Weakness    HPI: Eric Joyce is a 76 y.o. male with medical history significant of Htn CAD sent by pcp for abnormal labs. Pt has been sick for 3 weeks and hpi as below.  Pt is alert, awake  and reports. He felt bad  x 3 weeks : Lack of energy, fever, chills, sweats Dr. Tamala Bari pcp. Pcp ordered labs and calls him to go hospital.  Symptoms: Pt went to [place] in [place] and back on jan 1st. [place] trip 1 week.  Jan 10th started to feel he was having a  bad cold : aches / fever/ headaches . On the 13th he felt worse : Fever / Chills / sweats /no rash or redness  so he went to Friday he took covid test and was negative. Fever would get worse in evening.  Sat / Sunday better and Monday all the symptoms come back.  Then that wednesday he went to walk in clinic [facility] in [place]. Gave him amoxicillin - took amoxicillin for two weeks but got worse. He got diarrhea - immediately after day 2 of amox. He  took amoxicillin for two weeks even with diarrhea.  stool was black,  Diarrhea was 6 times a day, pt did take pepcid complete and pepto bismal no imodium . Today he saw blood in urine.  The last time he had black stools was yesterday. Pt has never had blood transfusion.  No h/o covid.  SOB started on jan 10th.  No chest pain.  Pt lives alone. Pt reports problems with urination and polyuria but has been seeing urologist and is on meds and is helping him a lot.   Diarrhea started 2 weeks ago.  Review of Systems: As mentioned in the history of present illness. All other systems reviewed and are negative. Past Medical History:  Diagnosis Date   Diabetes mellitus without complication ([place])    Hyperlipidemia     Hypertension    Myocardial infarction [facility])    Past Surgical History:  Procedure Laterality Date   APPENDECTOMY     CARDIAC SURGERY     2 stents   EYE SURGERY     REPLACEMENT TOTAL KNEE Right    TONSILLECTOMY AND ADENOIDECTOMY     Social History:  reports that he has quit smoking. His smoking use included cigarettes. He has never used smokeless tobacco. He reports that he does not currently use alcohol. He reports that he does not use drugs.  Allergies  Allergen Reactions   Sulfamethoxazole-Trimethoprim     Family History  Problem Relation Age of Onset   Lung cancer Mother    Heart disease Father    Diabetes Paternal Uncle    Diabetes Paternal Grandmother     Prior to Admission medications   Medication Sig Start Date End Date Taking? Authorizing Provider  aspirin EC 81 MG tablet Take by mouth.    [provider]  atorvastatin (LIPITOR) 40 MG tablet  03/27/17   [provider]  Cholecalciferol 125 MCG (5000 UT) TABS Take by mouth.    [provider]  Coenzyme Q10 100 MG capsule Take by mouth.    [provider]  DOCOSAHEXAENOIC ACID PO Take by mouth.    [provider]  gabapentin (NEURONTIN) 300 MG capsule Take 300 mg by mouth 3 (three) times daily.    [provider]  lisinopril (PRINIVIL,ZESTRIL) 10 MG tablet  03/27/17   [provider]  meloxicam (MOBIC) 7.5 MG tablet Take 1 tablet (7.5 mg total) by mouth daily. 06/09/17   Gardiner Barefoot, DPM  metFORMIN (GLUCOPHAGE) 500 MG tablet  03/27/17   [provider]  metoprolol succinate (TOPROL-XL) 50 MG 24 hr tablet  03/27/17   [provider]  oxybutynin (DITROPAN-XL) 10 MG 24 hr tablet Take 1 tablet (10 mg total) by mouth daily. 02/15/21   Billey Co, MD  vitamin B-12 (CYANOCOBALAMIN) 1000 MCG tablet Take by mouth.    [provider]    Physical Exam: Vitals:   04/04/21 2030 04/04/21 2130 04/05/21 0029 04/05/21 0100  BP: (!) 109/57  111/65 113/71 (!) 110/96  Pulse: (!) 103  94 100  Resp: _0 Temp:   98 F (36.7 C)   TempSrc:   Oral   SpO2: 96%  97% 96%   Pt in ed is alert,awake and oriented.  Physical Exam Vitals reviewed.  Constitutional:      Appearance: He is obese. He is not ill-appearing.  HENT:     Head: Normocephalic and atraumatic.     Right Ear: External ear normal.     Left Ear: External ear normal.     Nose: Nose normal.     Mouth/Throat:     Mouth: Mucous membranes are moist.  Eyes:     Extraocular Movements: Extraocular movements intact.     Pupils: Pupils are equal, round, and reactive to light.  Cardiovascular:     Rate and Rhythm: Normal rate.  Pulmonary:     Effort: Pulmonary effort is normal.     Breath sounds: Normal breath sounds.  Abdominal:     General: Bowel sounds are normal. There is no distension.     Palpations: Abdomen is soft.     Tenderness: There is no abdominal tenderness. There is no guarding.  Neurological:     General: No focal deficit present.     Mental Status: He is alert and oriented to person, place, and time.     Cranial Nerves: No cranial nerve deficit.  Psychiatric:        Mood and Affect: Mood normal.        Behavior: Behavior normal.    Data Reviewed: >BMP shows mild hyponatremia with a sodium of 131 normal creatinine of 0.73, glucose of 202, calcium 8.8. >Liver function test elevated alk phos of 133 albumin 2.4 AST 128, ALT of 174 with no trends available, elevated direct bili of 0.9 and indirect bili of 1.2 total bili of 2.1, LDH of 233 troponin of 28, lactic acid of 1.7. > CBC shows a normal hemoglobin of 13.7, WBC count of 1.2 platelets of less than 10. > D-dimer of 2.60. > Phosphorus level of 2.1.   > Lactic acid of 2.4 and has gone up from 1.7. > BNP of 74.3, TNI -repeat pending.  >USG shows sludge of gall bladder.    As above.   Assessment and Plan: * Generalized weakness Attribute to combination of electrolytes  And also  underlying sepsis ,and suspect HUS. Supportive care with IVF.  Fall precaution.   SOB (shortness of breath)- (present on admission) Attribute to PE from his recent travel .  Acute pulmonary embolism ([place])- (present on admission) Acute PE. Pt also reported with SOB and recent  travel.  Currently pt off any anticoagulation .  Thrombocytopenia ([place])- (present on admission) Acute thrombocytopenia presenting  With Diarrheal illness I suspect pt may have had HUS with shiga toxin and associated with diarrhea. D/d also include hematological malignancy.  We will hold off on antiplatelet and anticoagulation as pt is at high bleeding risk.  Await for heme onc recommendation .    Other neutropenia ([place])- (present on admission) greatly appreciate hematology consult.   Fever and chills- (present on admission) Supportive care  With tylenol and MIVF.  Alcoholism ([place])- (present on admission) thiamine 100 mg.  etoh level.   CAD (coronary artery disease)- (present on admission) Stable.  No chest pain.   Diabetes mellitus type 2, uncomplicated ([place]) Cont with SSI and glycemic protocol.    Lactic acidosis- (present on admission) suspect sepsis related.  Will follow level and culture and hydrate.   Melena- (present on admission) Suspect from GIB from mobic in addition to thrombocytopenia.   Diarrhea- (present on admission) ? HUS/ TTP.  GI panel PCR for stool studies.    Hypophosphatemia- (present on admission) Replaced.  Pt is presenting with vague multisystem symptoms and labs  concerning for bleeding or viral syndrome.   Advance Care Planning:   Code Status: Full Code   Consults: Hematology- Dr. Tasia Catchings.   Family Communication: Bayard Hugger (Sister)  [phone] ([place])  Severity of Illness: The appropriate patient status for this patient is INPATIENT. Inpatient status is judged to be reasonable and necessary in order to provide the required intensity of service to ensure the  patient's safety. The patient's presenting symptoms, physical exam findings, and initial radiographic and laboratory data in the context of their chronic comorbidities is felt to place them at high risk for further clinical deterioration. Furthermore, it is not anticipated that the patient will be medically stable for discharge from the hospital within 2 midnights of admission.   * I certify that at the point of admission it is my clinical judgment that the patient will require inpatient hospital care spanning beyond 2 midnights from the point of admission due to high intensity of service, high risk for further deterioration and high frequency of surveillance required.*  Author: Para Skeans, MD 04/05/2021 1:11 AM  For on call review [email or web address].

## 2021-04-04 NOTE — ED Triage Notes (Signed)
Pt comes with c/o weakness, fever chills for over 3 weeks. Pt states black stools at times with lots of diarrhea. Pt had labs drawn today and was instructed to come to ED for possible transfusion.

## 2021-04-04 NOTE — ED Triage Notes (Signed)
First nurse note:  Arrives from home via [facility].  PCP called and instructed patient to be seen through ED for abnormal labs, possible low platelet count.  110  P 149/83  BP 96% RA CBG;  243-- hx of DM, not taking metformin as prescribed.

## 2021-04-04 NOTE — Progress Notes (Signed)
Pharmacy Antibiotic Note  Eric Joyce is a 76 y.o. male admitted on 04/04/2021 with  IAI .  Pharmacy has been consulted for zosyn dosing.  Plan: Zosyn 3.375g IV q8h (4 hour infusion).     Temp (24hrs), Avg:98 F (36.7 C), Min:98 F (36.7 C), Max:98 F (36.7 C)  Recent Labs  Lab 04/04/21 1505 04/04/21 1603  WBC 1.2*  --   CREATININE 0.73  --   LATICACIDVEN  --  1.7    CrCl cannot be calculated (Unknown ideal weight.).    No Known Allergies  Antimicrobials this admission: Zosyn 2/1   >>       >>    Dose adjustments this admission:    Microbiology results: 2/1 BCx:     UCx:      Sputum:      MRSA PCR:    Thank you for allowing pharmacy to be a part of this patients care.  Eric Joyce A 04/04/2021 6:23 PM

## 2021-04-05 ENCOUNTER — Inpatient Hospital Stay: Payer: PPO

## 2021-04-05 DIAGNOSIS — R531 Weakness: Secondary | ICD-10-CM | POA: Diagnosis not present

## 2021-04-05 DIAGNOSIS — D708 Other neutropenia: Secondary | ICD-10-CM | POA: Diagnosis not present

## 2021-04-05 DIAGNOSIS — D696 Thrombocytopenia, unspecified: Secondary | ICD-10-CM | POA: Diagnosis not present

## 2021-04-05 DIAGNOSIS — R197 Diarrhea, unspecified: Secondary | ICD-10-CM | POA: Diagnosis not present

## 2021-04-05 DIAGNOSIS — I2699 Other pulmonary embolism without acute cor pulmonale: Secondary | ICD-10-CM | POA: Diagnosis not present

## 2021-04-05 LAB — BLOOD GAS, VENOUS
Acid-Base Excess: 1.7 mmol/L (ref 0.0–2.0)
Bicarbonate: 24.2 mmol/L (ref 20.0–28.0)
O2 Saturation: 87.3 %
Patient temperature: 37
pCO2, Ven: 31 mmHg — ABNORMAL LOW (ref 44.0–60.0)
pH, Ven: 7.5 — ABNORMAL HIGH (ref 7.250–7.430)
pO2, Ven: 48 mmHg — ABNORMAL HIGH (ref 32.0–45.0)

## 2021-04-05 LAB — CBC WITH DIFFERENTIAL/PLATELET
Abs Immature Granulocytes: 0.03 10*3/uL (ref 0.00–0.07)
Abs Immature Granulocytes: 0.02 10*3/uL (ref 0.00–0.07)
Abs Immature Granulocytes: 0.03 10*3/uL (ref 0.00–0.07)
Abs Immature Granulocytes: 0.03 10*3/uL (ref 0.00–0.07)
Basophils Absolute: 0 10*3/uL (ref 0.0–0.1)
Basophils Absolute: 0 10*3/uL (ref 0.0–0.1)
Basophils Absolute: 0 10*3/uL (ref 0.0–0.1)
Basophils Absolute: 0 10*3/uL (ref 0.0–0.1)
Basophils Relative: 0 %
Basophils Relative: 1 %
Basophils Relative: 1 %
Basophils Relative: 0 %
Eosinophils Absolute: 0.2 10*3/uL (ref 0.0–0.5)
Eosinophils Absolute: 0.2 10*3/uL (ref 0.0–0.5)
Eosinophils Absolute: 0.2 10*3/uL (ref 0.0–0.5)
Eosinophils Absolute: 0.2 10*3/uL (ref 0.0–0.5)
Eosinophils Relative: 13 %
Eosinophils Relative: 14 %
Eosinophils Relative: 16 %
Eosinophils Relative: 13 %
HCT: 37.7 % — ABNORMAL LOW (ref 39.0–52.0)
HCT: 35.8 % — ABNORMAL LOW (ref 39.0–52.0)
HCT: 35.6 % — ABNORMAL LOW (ref 39.0–52.0)
HCT: 37 % — ABNORMAL LOW (ref 39.0–52.0)
Hemoglobin: 13 g/dL (ref 13.0–17.0)
Hemoglobin: 12.3 g/dL — ABNORMAL LOW (ref 13.0–17.0)
Hemoglobin: 12.3 g/dL — ABNORMAL LOW (ref 13.0–17.0)
Hemoglobin: 12.9 g/dL — ABNORMAL LOW (ref 13.0–17.0)
Immature Granulocytes: 2 %
Immature Granulocytes: 2 %
Immature Granulocytes: 2 %
Immature Granulocytes: 3 %
Lymphocytes Relative: 64 %
Lymphocytes Relative: 55 %
Lymphocytes Relative: 57 %
Lymphocytes Relative: 55 %
Lymphs Abs: 0.8 10*3/uL (ref 0.7–4.0)
Lymphs Abs: 0.7 10*3/uL (ref 0.7–4.0)
Lymphs Abs: 0.8 10*3/uL (ref 0.7–4.0)
Lymphs Abs: 0.7 10*3/uL (ref 0.7–4.0)
MCH: 30.7 pg (ref 26.0–34.0)
MCH: 30.2 pg (ref 26.0–34.0)
MCH: 30.4 pg (ref 26.0–34.0)
MCH: 30.5 pg (ref 26.0–34.0)
MCHC: 34.5 g/dL (ref 30.0–36.0)
MCHC: 34.4 g/dL (ref 30.0–36.0)
MCHC: 34.6 g/dL (ref 30.0–36.0)
MCHC: 34.9 g/dL (ref 30.0–36.0)
MCV: 88.9 fL (ref 80.0–100.0)
MCV: 88 fL (ref 80.0–100.0)
MCV: 87.9 fL (ref 80.0–100.0)
MCV: 87.5 fL (ref 80.0–100.0)
Monocytes Absolute: 0.1 10*3/uL (ref 0.1–1.0)
Monocytes Absolute: 0.1 10*3/uL (ref 0.1–1.0)
Monocytes Absolute: 0.1 10*3/uL (ref 0.1–1.0)
Monocytes Absolute: 0.1 10*3/uL (ref 0.1–1.0)
Monocytes Relative: 7 %
Monocytes Relative: 10 %
Monocytes Relative: 7 %
Monocytes Relative: 8 %
Neutro Abs: 0.2 10*3/uL — CL (ref 1.7–7.7)
Neutro Abs: 0.2 10*3/uL — CL (ref 1.7–7.7)
Neutro Abs: 0.2 10*3/uL — CL (ref 1.7–7.7)
Neutro Abs: 0.3 10*3/uL — CL (ref 1.7–7.7)
Neutrophils Relative %: 14 %
Neutrophils Relative %: 18 %
Neutrophils Relative %: 17 %
Neutrophils Relative %: 21 %
Platelets: 7 10*3/uL — CL (ref 150–400)
Platelets: <5 10*3/uL — CL (ref 150–400)
Platelets: 5 10*3/uL — CL (ref 150–400)
Platelets: <5 10*3/uL — CL (ref 150–400)
RBC: 4.24 MIL/uL (ref 4.22–5.81)
RBC: 4.07 MIL/uL — ABNORMAL LOW (ref 4.22–5.81)
RBC: 4.05 MIL/uL — ABNORMAL LOW (ref 4.22–5.81)
RBC: 4.23 MIL/uL (ref 4.22–5.81)
RDW: 13.6 % (ref 11.5–15.5)
RDW: 13.3 % (ref 11.5–15.5)
RDW: 13.6 % (ref 11.5–15.5)
RDW: 13.7 % (ref 11.5–15.5)
Smear Review: NORMAL
Smear Review: NORMAL
Smear Review: NORMAL
Smear Review: NORMAL
WBC: 1.3 10*3/uL — CL (ref 4.0–10.5)
WBC: 1.3 10*3/uL — CL (ref 4.0–10.5)
WBC: 1.3 10*3/uL — CL (ref 4.0–10.5)
WBC: 1.2 10*3/uL — CL (ref 4.0–10.5)
nRBC: 0 % (ref 0.0–0.2)
nRBC: 0 % (ref 0.0–0.2)
nRBC: 0 % (ref 0.0–0.2)
nRBC: 0 % (ref 0.0–0.2)

## 2021-04-05 LAB — VITAMIN B12: Vitamin B-12: >7500 pg/mL — ABNORMAL HIGH (ref 180–914)

## 2021-04-05 LAB — AMMONIA: Ammonia: 30 umol/L (ref 9–35)

## 2021-04-05 LAB — ABO/RH: ABO/RH(D): O NEG

## 2021-04-05 LAB — HEPATIC FUNCTION PANEL
ALT: 150 U/L — ABNORMAL HIGH (ref 0–44)
AST: 108 U/L — ABNORMAL HIGH (ref 15–41)
Albumin: 2.5 g/dL — ABNORMAL LOW (ref 3.5–5.0)
Alkaline Phosphatase: 131 U/L — ABNORMAL HIGH (ref 38–126)
Bilirubin, Direct: 0.9 mg/dL — ABNORMAL HIGH (ref 0.0–0.2)
Indirect Bilirubin: 1.2 mg/dL — ABNORMAL HIGH (ref 0.3–0.9)
Total Bilirubin: 2.1 mg/dL — ABNORMAL HIGH (ref 0.3–1.2)
Total Protein: 6.2 g/dL — ABNORMAL LOW (ref 6.5–8.1)

## 2021-04-05 LAB — HEPATITIS PANEL, ACUTE
HCV Ab: NONREACTIVE
Hep A IgM: NONREACTIVE
Hep B C IgM: NONREACTIVE
Hepatitis B Surface Ag: NONREACTIVE

## 2021-04-05 LAB — URINALYSIS, COMPLETE (UACMP) WITH MICROSCOPIC
Bacteria, UA: NONE SEEN
Glucose, UA: NEGATIVE mg/dL
Leukocytes,Ua: NEGATIVE
Nitrite: NEGATIVE
Protein, ur: 30 mg/dL — AB
Specific Gravity, Urine: 1.015 (ref 1.005–1.030)
Squamous Epithelial / HPF: NONE SEEN (ref 0–5)
pH: 5.5 (ref 5.0–8.0)

## 2021-04-05 LAB — BRAIN NATRIURETIC PEPTIDE: B Natriuretic Peptide: 74.3 pg/mL (ref 0.0–100.0)

## 2021-04-05 LAB — GAMMA GT: GGT: 77 U/L — ABNORMAL HIGH (ref 7–50)

## 2021-04-05 LAB — IMMATURE PLATELET FRACTION: Immature Platelet Fraction: 15.5 % — ABNORMAL HIGH (ref 1.2–8.6)

## 2021-04-05 LAB — GLUCOSE, CAPILLARY
Glucose-Capillary: 159 mg/dL — ABNORMAL HIGH (ref 70–99)
Glucose-Capillary: 166 mg/dL — ABNORMAL HIGH (ref 70–99)
Glucose-Capillary: 204 mg/dL — ABNORMAL HIGH (ref 70–99)

## 2021-04-05 LAB — C-REACTIVE PROTEIN: CRP: 21.9 mg/dL — ABNORMAL HIGH (ref <1.0)

## 2021-04-05 LAB — C DIFFICILE QUICK SCREEN W PCR REFLEX
C Diff antigen: NEGATIVE
C Diff interpretation: NOT DETECTED
C Diff toxin: NEGATIVE

## 2021-04-05 LAB — ETHANOL: Alcohol, Ethyl (B): <10 mg/dL (ref <10)

## 2021-04-05 LAB — SEDIMENTATION RATE: Sed Rate: 21 mm/hr — ABNORMAL HIGH (ref 0–20)

## 2021-04-05 LAB — HIV ANTIBODY (ROUTINE TESTING W REFLEX): HIV Screen 4th Generation wRfx: NONREACTIVE

## 2021-04-05 LAB — CK: Total CK: 20 U/L — ABNORMAL LOW (ref 49–397)

## 2021-04-05 LAB — TROPONIN I (HIGH SENSITIVITY)
Troponin I (High Sensitivity): 25 ng/L — ABNORMAL HIGH (ref <18)
Troponin I (High Sensitivity): 22 ng/L — ABNORMAL HIGH (ref <18)

## 2021-04-05 LAB — CORTISOL: Cortisol, Plasma: 35.6 ug/dL

## 2021-04-05 MED ORDER — SODIUM CHLORIDE 0.9% IV SOLUTION: Freq: Once | INTRAVENOUS | Status: DC

## 2021-04-05 MED ORDER — GABAPENTIN 400 MG PO CAPS
400.0000 mg | ORAL_CAPSULE | Freq: Three times a day (TID) | ORAL | Status: DC
  Administered 2021-04-05 – 2021-04-12 (×21): 400 mg via ORAL
  Filled 2021-04-05 (×21): qty 1

## 2021-04-05 MED ORDER — OXYCODONE HCL 5 MG PO TABS
5.0000 mg | ORAL_TABLET | Freq: Four times a day (QID) | ORAL | Status: DC | PRN
  Administered 2021-04-07: 5 mg via ORAL
  Filled 2021-04-05: qty 1

## 2021-04-05 MED ORDER — LIDOCAINE-EPINEPHRINE 1 %-1:100000 IJ SOLN
20.0000 mL | Freq: Once | INTRAMUSCULAR | Status: AC
  Administered 2021-04-05: 20 mL via INTRADERMAL
  Filled 2021-04-05: qty 20

## 2021-04-05 MED ORDER — CYCLOBENZAPRINE HCL 10 MG PO TABS
5.0000 mg | ORAL_TABLET | Freq: Three times a day (TID) | ORAL | Status: DC | PRN
  Administered 2021-04-05 – 2021-04-07 (×4): 5 mg via ORAL
  Filled 2021-04-05 (×4): qty 1

## 2021-04-05 MED ORDER — METOPROLOL SUCCINATE ER 50 MG PO TB24
50.0000 mg | ORAL_TABLET | Freq: Every day | ORAL | Status: DC
  Administered 2021-04-05 – 2021-04-12 (×8): 50 mg via ORAL
  Filled 2021-04-05 (×8): qty 1

## 2021-04-05 MED ORDER — ADULT MULTIVITAMIN W/MINERALS CH
1.0000 | ORAL_TABLET | Freq: Every day | ORAL | Status: DC
  Administered 2021-04-05 – 2021-04-12 (×8): 1 via ORAL
  Filled 2021-04-05 (×8): qty 1

## 2021-04-05 MED ORDER — DEXAMETHASONE 6 MG PO TABS
40.0000 mg | ORAL_TABLET | Freq: Every day | ORAL | Status: DC
  Administered 2021-04-05: 40 mg via ORAL
  Filled 2021-04-05: qty 1

## 2021-04-05 MED ORDER — ENSURE MAX PROTEIN PO LIQD
11.0000 [oz_av] | Freq: Two times a day (BID) | ORAL | Status: DC
  Administered 2021-04-05 – 2021-04-12 (×13): 11 [oz_av] via ORAL
  Filled 2021-04-05: qty 330

## 2021-04-05 MED ORDER — DEXAMETHASONE SODIUM PHOSPHATE 10 MG/ML IJ SOLN
40.0000 mg | Freq: Every day | INTRAMUSCULAR | Status: AC
  Administered 2021-04-06 – 2021-04-08 (×3): 40 mg via INTRAVENOUS
  Filled 2021-04-05 (×3): qty 4

## 2021-04-05 MED ORDER — OXYCODONE HCL 5 MG PO TABS
5.0000 mg | ORAL_TABLET | ORAL | Status: AC
  Administered 2021-04-05: 5 mg via ORAL
  Filled 2021-04-05: qty 1

## 2021-04-05 MED ORDER — INSULIN ASPART 100 UNIT/ML IJ SOLN
0.0000 [IU] | Freq: Three times a day (TID) | INTRAMUSCULAR | Status: DC
  Administered 2021-04-06: 3 [IU] via SUBCUTANEOUS
  Filled 2021-04-05: qty 1

## 2021-04-05 NOTE — Assessment & Plan Note (Signed)
Supportive care  With tylenol and MIVF.

## 2021-04-05 NOTE — Assessment & Plan Note (Signed)
Stable. No chest pain.

## 2021-04-05 NOTE — Assessment & Plan Note (Signed)
Replaced. °

## 2021-04-05 NOTE — Assessment & Plan Note (Addendum)
suspect sepsis related.  Will follow level and culture and hydrate.

## 2021-04-05 NOTE — Progress Notes (Signed)
Initial Nutrition Assessment  DOCUMENTATION CODES:  Not applicable  INTERVENTION:  Add Ensure Max po BID, each supplement provides 150 kcal and 30 grams of protein.    Add MVI with minerals daily.  Encourage PO and supplement intake.  NUTRITION DIAGNOSIS:  Inadequate oral intake related to decreased appetite, diarrhea as evidenced by per patient/family report.  GOAL:  Patient will meet greater than or equal to 90% of their needs  MONITOR:  PO intake, Supplement acceptance, Labs, Weight trends, I & O's  REASON FOR ASSESSMENT:  Malnutrition Screening Tool    ASSESSMENT:  76 yo male with a PMH of T2DM, HTN, previous MI, and HLD who is admitted with generalized weakness.  Spoke with pt at bedside. Pt reports that up until about 3 weeks ago when his symptoms of diarrhea and poor appetite started, he was eating well with an excellent appetite.   He has to make himself eat now, but he ate a little bit more today.  Per Care Everywhere, in 01/2021, pt weighed 235 lbs. Pt reports this is UBW. At current, pt weighs around 216 lbs.   This is a 19 lb (8%) loss in 3 months, which is significant and severe for the time frame.  Pt at risk for malnutrition given significant weight loss.  Pt with hiccups. RD suggested a spoonful of peanut butter to relieve them. Pt requested peanut butter and a spoon - RD supplied these from the nourishment room.  Pt requested Ensure Max. RD to supply these BID, as well as MVI with minerals.  Medications: reviewed; SSI, NaCl @ 75 ml/hr, Zosyn per IV TID  Labs: reviewed; CBG 159 (H)  NUTRITION - FOCUSED PHYSICAL EXAM: Flowsheet Row Most Recent Value  Orbital Region No depletion  Upper Arm Region No depletion  Thoracic and Lumbar Region No depletion  Buccal Region No depletion  Temple Region No depletion  Clavicle Bone Region No depletion  Clavicle and Acromion Bone Region No depletion  Scapular Bone Region No depletion  Dorsal Hand No depletion   Patellar Region No depletion  Anterior Thigh Region No depletion  Posterior Calf Region No depletion  Edema (RD Assessment) None  Hair Reviewed  Eyes Reviewed  Mouth Reviewed  Skin Reviewed  Nails Reviewed   Diet Order:   Diet Order             Diet heart healthy/carb modified Room service appropriate? Yes; Fluid consistency: Thin  Diet effective now                  EDUCATION NEEDS:  Education needs have been addressed  Skin:  Skin Assessment: Reviewed RN Assessment  Last BM:  04/05/21  Height:  Ht Readings from Last 1 Encounters:  04/05/21 6\' 1"  (1.854 m)   Weight:  Wt Readings from Last 1 Encounters:  04/05/21 98.3 kg   BMI:  Body mass index is 28.59 kg/m.  Estimated Nutritional Needs:  Kcal:  2100-2300 Protein:  110-125 grams Fluid:  >2.1 L  Derrel Nip, RD, LDN (she/her/hers) Clinical Inpatient Dietitian RD Pager/After-Hours/Weekend Pager # in [place]

## 2021-04-05 NOTE — Assessment & Plan Note (Signed)
greatly appreciate hematology consult.

## 2021-04-05 NOTE — Progress Notes (Signed)
Cross Cover Platelet transfusion ordered for platelet count <5,000. Previously result was 7000.  Recommendations per hematology to transfuse <10,000 or active bleeding.

## 2021-04-05 NOTE — Assessment & Plan Note (Signed)
Cont with SSI and glycemic protocol.

## 2021-04-05 NOTE — Assessment & Plan Note (Signed)
Attribute to PE from his recent travel .

## 2021-04-05 NOTE — Assessment & Plan Note (Signed)
Suspect from GIB from mobic in addition to thrombocytopenia.

## 2021-04-05 NOTE — Progress Notes (Signed)
PROGRESS NOTE    Eric Joyce  YNW:[id number] DOB: 27-Feb-1946 DOA: 04/04/2021 PCP: [facility]  Outpatient Specialists: cardiology    Brief Narrative:  From admission h and p Eric Joyce is a 76 y.o. male with medical history significant of Htn CAD sent by pcp for abnormal labs. Pt has been sick for 3 weeks and hpi as below.  Pt is alert, awake  and reports. He felt bad  x 3 weeks : Lack of energy, fever, chills, sweats Dr. Tamala Bari pcp. Pcp ordered labs and calls him to go hospital.  Symptoms: Pt went to [place] in [place] and back on jan 1st. [place] trip 1 week.  Jan 10th started to feel he was having a  bad cold : aches / fever/ headaches . On the 13th he felt worse : Fever / Chills / sweats /no rash or redness  so he went to Friday he took covid test and was negative. Fever would get worse in evening.  Sat / Sunday better and Monday all the symptoms come back.  Then that wednesday he went to walk in clinic [facility] in [place]. Gave him amoxicillin - took amoxicillin for two weeks but got worse. He got diarrhea - immediately after day 2 of amox. He  took amoxicillin for two weeks even with diarrhea.  stool was black,  Diarrhea was 6 times a day, pt did take pepcid complete and pepto bismal no imodium . Today he saw blood in urine.  The last time he had black stools was yesterday. Pt has never had blood transfusion.  No h/o covid.  SOB started on jan 10th.  No chest pain.   Assessment & Plan:   Principal Problem:   Generalized weakness Active Problems:   CAD (coronary artery disease)   Diabetes mellitus type 2, uncomplicated (HCC)   Alcoholism (HCC)   Other neutropenia (HCC)   Thrombocytopenia (HCC)   Abscess of anal and rectal regions   Fever and chills   Diarrhea   Hypophosphatemia   Acute pulmonary embolism (HCC)   Lactic acidosis   SOB (shortness of breath)   Melena  # Thrombocytopenia # Leukopenia # Splenomegaly Severe. In setting of  3 weeks constitutional symptoms, diarrhea, ibuprofen use. Splenomegaly on CT. No signs cirrhosis on imaging. Renal function appears normal. Immature platelet fraction elevated indicating at least partial bone marrow response. With splenomegaly and lack of response to platelet transfusion peripheral destruction appears to be present. No clear infectious etiology. Heme and ID are involved. This may be some form of ITP. Hepatitis panel neg. HIV neg. C diff neg - treating with steroids, if responds should see that in the next 1-2 days - cont zosyn pending gi pathogen panel results - f/u EBV - f/u lyme - f/u ana - f/u GI pathogen panel - f/u CMV - f/u RPR - f/u varicella - f/u erlichia - f/u bartonella - f/u rickettsia  # Rectal wall thickening, possible abscess - gen surg to eval  # Jaw pain CT of face unremarkable  # Pulmonary embolus Pro-thrombotic state? Underlying malignancy. Anticoagulation contraindicated given thrombocytopenia - check PVL of lower extremities  # Hiccups - trial flexeril  # CAD S/p multiple stents. Asymptomatic - cont home metop - hold statin given mild liver dysfunction here - hold aspirin  # HTN Here bnp wnl - holding lisinopril  # T2DM Glucose not elevated - SSI; hold home metformin   DVT prophylaxis: SCDs Code Status: full Family Communication: none @ bedside  Level of care: Progressive Status is: Inpatient Remains inpatient appropriate because: severity of illnes            Consultants:  ID, heme  Procedures: none  Antimicrobials:  zosyn    Subjective: Diarrhea improved. Feels weak. Complains of hiccups and jaw pain.  Objective: Vitals:   04/05/21 0459 04/05/21 0522 04/05/21 0523 04/05/21 0630  BP: 97/72 (!) 92/50 (!) 93/52 108/70  Pulse: (!) 107 98 (!) 106 (!) 104  Resp: 16 15  17   Temp: 98.2 F (36.8 C) 97.9 F (36.6 C) 97.7 F (36.5 C) 98.2 F (36.8 C)  TempSrc: Oral Oral Oral Oral  SpO2: 98% 97% 98% 96%   Weight:      Height:        Intake/Output Summary (Last 24 hours) at 04/05/2021 0901 Last data filed at 04/05/2021 0700 Gross per 24 hour  Intake 1307.94 ml  Output --  Net 1307.94 ml   Filed Weights   04/05/21 0223  Weight: 98.3 kg    Examination:  General exam: Appears calm and comfortable  Respiratory system: Clear to auscultation. Respiratory effort normal. Cardiovascular system: S1 & S2 heard, RRR. No JVD, murmurs, rubs, gallops or clicks. No pedal edema. Gastrointestinal system: Abdomen is obese, soft and nontender. No organomegaly or masses felt. Normal bowel sounds heard. Central nervous system: Alert and oriented. No focal neurological deficits. Extremities: Symmetric 5 x 5 power. Skin: No rashes, lesions or ulcers. No bruising Psychiatry: Judgement and insight appear normal. Mood & affect appropriate.     Data Reviewed: I have personally reviewed following labs and imaging studies  CBC: Recent Labs  Lab 04/04/21 1505 04/05/21 0008 04/05/21 0237  WBC 1.2* 1.3* 1.3*  NEUTROABS 0.2* 0.2* 0.2*  HGB 13.7 13.0 12.3*  HCT 39.3 37.7* 35.8*  MCV 88.5 88.9 88.0  PLT 10* 7* <5*   Basic Metabolic Panel: Recent Labs  Lab 04/04/21 1505 04/04/21 1843  NA 131*  --   K 3.7  --   CL 97*  --   CO2 24  --   GLUCOSE 202*  --   BUN 22  --   CREATININE 0.73  --   CALCIUM 8.8*  --   MG  --  1.7  PHOS  --  2.1*   GFR: Estimated Creatinine Clearance: 100 mL/min (by C-G formula based on SCr of 0.73 mg/dL). Liver Function Tests: Recent Labs  Lab 04/04/21 1603  AST 128*  ALT 174*  ALKPHOS 133*  BILITOT 2.1*  PROT 5.9*  ALBUMIN 2.4*   Recent Labs  Lab 04/04/21 1843  LIPASE 30   Recent Labs  Lab 04/05/21 0237  AMMONIA 30   Coagulation Profile: No results for input(s): INR, PROTIME in the last 168 hours. Cardiac Enzymes: Recent Labs  Lab 04/05/21 0237  CKTOTAL 20*   BNP (last 3 results) No results for input(s): PROBNP in the last 8760  hours. HbA1C: No results for input(s): HGBA1C in the last 72 hours. CBG: No results for input(s): GLUCAP in the last 168 hours. Lipid Profile: No results for input(s): CHOL, HDL, LDLCALC, TRIG, CHOLHDL, LDLDIRECT in the last 72 hours. Thyroid Function Tests: Recent Labs    04/04/21 1843  TSH 0.952  FREET4 1.29*   Anemia Panel: Recent Labs    04/04/21 1843  VITAMINB12 >7,500*  FOLATE 17.4  RETICCTPCT 2.0   Urine analysis:    Component Value Date/Time   COLORURINE Yellow 04/27/2012 0853   APPEARANCEUR Clear 02/15/2021 0939   LABSPEC  1.017 04/27/2012 0853   PHURINE 5.0 04/27/2012 0853   GLUCOSEU Negative 02/15/2021 0939   GLUCOSEU Negative 04/27/2012 0853   HGBUR 1+ 04/27/2012 0853   BILIRUBINUR Negative 02/15/2021 0939   BILIRUBINUR Negative 04/27/2012 [place] Negative 04/27/2012 0853   PROTEINUR Negative 02/15/2021 0939   PROTEINUR Negative 04/27/2012 0853   NITRITE Negative 02/15/2021 0939   NITRITE Negative 04/27/2012 0853   LEUKOCYTESUR Negative 02/15/2021 0939   LEUKOCYTESUR Negative 04/27/2012 0853   Sepsis Labs: @LABRCNTIP (procalcitonin:4,lacticidven:4)  ) Recent Results (from the past 240 hour(s))  Resp Panel by RT-PCR (Flu A&B, Covid) Nasopharyngeal Swab     Status: None   Collection Time: 04/04/21  4:03 PM   Specimen: Nasopharyngeal Swab; Nasopharyngeal(NP) swabs in vial transport medium  Result Value Ref Range Status   SARS Coronavirus 2 by RT PCR NEGATIVE NEGATIVE Final    Comment: (NOTE) SARS-CoV-2 target nucleic acids are NOT DETECTED.  The SARS-CoV-2 RNA is generally detectable in upper respiratory specimens during the acute phase of infection. The lowest concentration of SARS-CoV-2 viral copies this assay can detect is 138 copies/mL. A negative result does not preclude SARS-Cov-2 infection and should not be used as the sole basis for treatment or other patient management decisions. A negative result may occur with  improper specimen  collection/handling, submission of specimen other than nasopharyngeal swab, presence of viral mutation(s) within the areas targeted by this assay, and inadequate number of viral copies(<138 copies/mL). A negative result must be combined with clinical observations, patient history, and epidemiological information. The expected result is Negative.  Fact Sheet for Patients:  EntrepreneurPulse.com.au  Fact Sheet for Healthcare Providers:  IncredibleEmployment.be  This test is no t yet approved or cleared by the [place] FDA and  has been authorized for detection and/or diagnosis of SARS-CoV-2 by FDA under an Emergency Use Authorization (EUA). This EUA will remain  in effect (meaning this test can be used) for the duration of the COVID-19 declaration under Section 564(b)(1) of the Act, 21 U.S.C.section 360bbb-3(b)(1), unless the authorization is terminated  or revoked sooner.       Influenza A by PCR NEGATIVE NEGATIVE Final   Influenza B by PCR NEGATIVE NEGATIVE Final    Comment: (NOTE) The Xpert Xpress SARS-CoV-2/FLU/RSV plus assay is intended as an aid in the diagnosis of influenza from Nasopharyngeal swab specimens and should not be used as a sole basis for treatment. Nasal washings and aspirates are unacceptable for Xpert Xpress SARS-CoV-2/FLU/RSV testing.  Fact Sheet for Patients: EntrepreneurPulse.com.au  Fact Sheet for Healthcare Providers: IncredibleEmployment.be  This test is not yet approved or cleared by the [place] FDA and has been authorized for detection and/or diagnosis of SARS-CoV-2 by FDA under an Emergency Use Authorization (EUA). This EUA will remain in effect (meaning this test can be used) for the duration of the COVID-19 declaration under Section 564(b)(1) of the Act, 21 U.S.C. section 360bbb-3(b)(1), unless the authorization is terminated or revoked.  Performed at [facility], [address], [place], [place] [address]          Radiology Studies: DG Chest 2 View  Result Date: 04/04/2021 CLINICAL DATA:  Generalized weakness, fever and chills for more than 3 weeks. Black stool and copious diarrhea. EXAM: CHEST - 2 VIEW COMPARISON:  None. FINDINGS: Normal sized heart. Tortuous aorta. Clear lungs. Thoracic spine degenerative changes. IMPRESSION: No acute abnormality. Electronically Signed   By: Claudie Revering M.D.   On: 04/04/2021 17:06   CT Angio Chest  Pulmonary Embolism (PE) W or WO Contrast  Result Date: 04/04/2021 CLINICAL DATA:  Weakness and fever for several weeks, initial encounter EXAM: CT ANGIOGRAPHY CHEST WITH CONTRAST TECHNIQUE: Multidetector CT imaging of the chest was performed using the standard protocol during bolus administration of intravenous contrast. Multiplanar CT image reconstructions and MIPs were obtained to evaluate the vascular anatomy. RADIATION DOSE REDUCTION: This exam was performed according to the departmental dose-optimization program which includes automated exposure control, adjustment of the mA and/or kV according to patient size and/or use of iterative reconstruction technique. CONTRAST:  25mL OMNIPAQUE IOHEXOL 350 MG/ML SOLN COMPARISON:  None. FINDINGS: Cardiovascular: Thoracic aorta shows a normal branching pattern. No aneurysmal dilatation or dissection is noted. No cardiac enlargement is seen. Coronary calcifications are noted. The pulmonary artery shows a normal branching pattern bilaterally. Mild right lower lobe pulmonary emboli are seen posteriorly. No right heart strain is noted. Mediastinum/Nodes: Thoracic inlet is within normal limits. No sizable hilar or mediastinal adenopathy is noted. The esophagus is within normal limits. Lungs/Pleura: Lungs demonstrate mild dependent atelectatic changes. No sizable parenchymal nodules are noted. Upper Abdomen: Visualized upper abdomen is within normal limits. Musculoskeletal:  Degenerative changes of the thoracic spine are noted. No acute rib abnormality is noted. Review of the MIP images confirms the above findings. IMPRESSION: Changes consistent with right lower lobe pulmonary emboli. No right heart strain is noted. Mild dependent atelectatic changes. Critical Value/emergent results were communicated the Amion at the time of interpretation on 04/04/2021 at 10:28 Pm to Dr. Florina Ou , who verbally acknowledged these results. Electronically Signed   By: Inez Catalina M.D.   On: 04/04/2021 22:32   CT ABDOMEN PELVIS W CONTRAST  Result Date: 04/04/2021 CLINICAL DATA:  Weakness, fever, chills for 3 weeks, dark stool, diarrhea EXAM: CT ABDOMEN AND PELVIS WITH CONTRAST TECHNIQUE: Multidetector CT imaging of the abdomen and pelvis was performed using the standard protocol following bolus administration of intravenous contrast. RADIATION DOSE REDUCTION: This exam was performed according to the departmental dose-optimization program which includes automated exposure control, adjustment of the mA and/or kV according to patient size and/or use of iterative reconstruction technique. CONTRAST:  153mL OMNIPAQUE IOHEXOL 300 MG/ML  SOLN COMPARISON:  None. FINDINGS: Lower chest: No acute pleural or parenchymal lung disease. Hepatobiliary: No focal liver abnormality is seen. No gallstones, gallbladder wall thickening, or biliary dilatation. Pancreas: Unremarkable. No pancreatic ductal dilatation or surrounding inflammatory changes. Spleen: The spleen is enlarged measuring 15.5 x 15.0 x 5.7 cm. No focal parenchymal abnormalities. Adrenals/Urinary Tract: There are 2 sub 5 mm nonobstructing calculi within the lower pole left kidney. No right-sided calculi. Small bilateral renal cortical cysts. No obstructive uropathy. Bladder is unremarkable. The adrenals are normal. Stomach/Bowel: No bowel obstruction or ileus. Scattered diverticulosis of the sigmoid colon without evidence of diverticulitis. Mild  circumferential rectal wall thickening. There is a 1.7 x 1.2 cm low-attenuation region dorsal to the anal verge reference image 100/3, which could reflect a small perirectal abscess. Please correlate with physical exam findings. No inflammatory changes. Vascular/Lymphatic: Aortic atherosclerosis. No enlarged abdominal or pelvic lymph nodes. Numerous subcentimeter mesenteric and retroperitoneal lymph nodes are nonspecific. Reproductive: The prostate is mildly enlarged. Surgical clips are seen within the ventral aspect of the prostate. Other: No free fluid or free intraperitoneal gas. There is a small fat containing umbilical hernia. No bowel herniation. Musculoskeletal: No acute or destructive bony lesions. Reconstructed images demonstrate no additional findings. IMPRESSION: 1. Mild nonspecific circumferential rectal wall thickening, with possible small perirectal abscess  dorsal to the anal verge. Please correlate with physical exam findings. 2. Distal colonic diverticulosis without diverticulitis. 3. Nonobstructing less than 5 mm left renal calculi. 4. Splenomegaly. 5. Small fat containing umbilical hernia.  No bowel herniation. 6.  Aortic Atherosclerosis (ICD10-I70.0). Electronically Signed   By: Randa Ngo M.D.   On: 04/04/2021 17:50   US ARTERIAL ABI (SCREENING LOWER EXTREMITY)  Result Date: 04/05/2021 CLINICAL DATA:  Decreased pedal pulses. EXAM: NONINVASIVE PHYSIOLOGIC VASCULAR STUDY OF BILATERAL LOWER EXTREMITIES TECHNIQUE: Evaluation of both lower extremities were performed at rest, including calculation of ankle-brachial indices with single level Doppler, pressure and pulse volume recording. COMPARISON:  None. FINDINGS: Right ABI:  1.15 Left ABI:  1.26 Right Lower Extremity:  Irregular waveforms. Left Lower Extremity:  Normal arterial waveforms at the ankle. 1.0-1.4 Normal IMPRESSION: Normal resting ankle-brachial indices bilaterally. Electronically Signed   By: Markus Daft M.D.   On: 04/05/2021 08:44    CT Maxillofacial W Contrast  Result Date: 04/04/2021 CLINICAL DATA:  Bilateral TMJ with limited movement. Mouth pain. Possible malignancy. Fever and chills 3 weeks. EXAM: CT MAXILLOFACIAL WITH CONTRAST TECHNIQUE: Multidetector CT imaging of the maxillofacial structures was performed with intravenous contrast. Multiplanar CT image reconstructions were also generated. RADIATION DOSE REDUCTION: This exam was performed according to the departmental dose-optimization program which includes automated exposure control, adjustment of the mA and/or kV according to patient size and/or use of iterative reconstruction technique. CONTRAST:  164mL OMNIPAQUE IOHEXOL 300 MG/ML  SOLN COMPARISON:  None. FINDINGS: Osseous: Normal TMJ bilaterally. Normal alignment and no degenerative change. No lesion in the mandible. Mild periapical lucency around upper incisors bilaterally. Orbits: Negative for orbital mass or edema. Bilateral cataract extraction. Sinuses: Mucosal edema filling much of the left sphenoid sinus with bony thickening of the left sphenoid sinus. Remaining sinuses clear. Mastoid and middle ear clear bilaterally. Soft tissues: No soft tissue mass. No evidence of cellulitis or soft tissue abscess. Submandibular parotid glands normal bilaterally. Normal pharynx. Limited thyroid negative Limited intracranial: No acute abnormality. IMPRESSION: Normal TMJ bilaterally.  No dislocation or degenerative change. Small periapical lucency around upper incisors bilaterally compatible with dental infection. No evidence of soft tissue cellulitis or soft tissue abscess. No evidence of mass or malignancy Mucosal edema and bony thickening left sphenoid sinus. Electronically Signed   By: Franchot Gallo M.D.   On: 04/04/2021 17:59   US Abdomen Limited RUQ (LIVER/GB)  Result Date: 04/04/2021 CLINICAL DATA:  Elevated LFTs EXAM: ULTRASOUND ABDOMEN LIMITED RIGHT UPPER QUADRANT COMPARISON:  None. FINDINGS: Gallbladder: Moderate to large  amount of echogenic sludge. No shadowing calculi visualized. No significant wall thickening or pericholecystic edema. Negative sonographic Murphy's sign. Common bile duct: Diameter: 3 mm Liver: No focal lesion identified. Within normal limits in parenchymal echogenicity. Portal vein is patent on color Doppler imaging with normal direction of blood flow towards the liver. Other: None. IMPRESSION: Gallbladder sludge. Electronically Signed   By: Ofilia Neas M.D.   On: 04/04/2021 20:51        Scheduled Meds:  sodium chloride   Intravenous Once   insulin aspart  0-9 Units Subcutaneous TID WC   Continuous Infusions:  sodium chloride 75 mL/hr at 04/05/21 0027   piperacillin-tazobactam (ZOSYN)  IV 3.375 g (04/05/21 0639)     LOS: 1 day    Time spent: 61 min    Desma Maxim, MD [facility]   If 7PM-7AM, please contact night-coverage [email or web address] Password [email or web address] 04/05/2021, 9:01 AM

## 2021-04-05 NOTE — Assessment & Plan Note (Signed)
Acute PE. Pt also reported with SOB and recent travel.  Currently pt off any anticoagulation .

## 2021-04-05 NOTE — Assessment & Plan Note (Signed)
Attribute to combination of electrolytes  And also underlying sepsis ,and suspect HUS. Supportive care with IVF.  Fall precaution.

## 2021-04-05 NOTE — Consult Note (Signed)
Patient ID: Vinayak Bobier Haseley, male   DOB: [birth year], 76 y.o.   MRN: [id number]  HPI Ward Boissonneault [place] is a 76 y.o. male seen in consultation at the request of Dr.Wouk, case discussed with him in detail.  He presents to the ER yesterday with failure to thrive , generalized weakness and pancytopenia.  He reports that he has been feeling ill for the last 3 weeks.  Specifically he now endorses anorectal pain that is severe that it started for the last few days.  He also endorses diarrhea  He underwent a CT scan of the abdomen pelvis that I have personally reviewed showing evidence of a perirectal abscess. Splenomegaly.  No evidence of necrotizing infection. At presentation, CBC showed WBC of 1.2, platelet count 10,000, hemoglobin of 13.7.  Predominantly neutropenia with ANC 0.2. He has normal kidney function of 0.73, AST 122, ALT 174, alkaline phosphatase 133.  Total bilirubin is 2.1, indirect bilirubin 1.2, bilirubin direct 0.9.  Fibrinogen 462, lactic acid 1.7, LDH was elevated 233,  He has given a blood transfusion but did not respond.  Hematology thinks that he is eating up the platelets and was started on steroids  HPI  Past Medical History:  Diagnosis Date   Diabetes mellitus without complication ([place])    Hyperlipidemia    Hypertension    Myocardial infarction [facility])     Past Surgical History:  Procedure Laterality Date   APPENDECTOMY     CARDIAC SURGERY     2 stents   EYE SURGERY     REPLACEMENT TOTAL KNEE Right    TONSILLECTOMY AND ADENOIDECTOMY      Family History  Problem Relation Age of Onset   Lung cancer Mother    Heart disease Father    Diabetes Paternal Uncle    Diabetes Paternal Grandmother     Social History Social History   Tobacco Use   Smoking status: Former    Types: Cigarettes   Smokeless tobacco: Never  Substance Use Topics   Alcohol use: Not Currently   Drug use: Never    Allergies  Allergen Reactions   Sulfamethoxazole-Trimethoprim     Current  Facility-Administered Medications  Medication Dose Route Frequency Provider Last Rate Last Admin   0.9 %  sodium chloride infusion (Manually program via Guardrails IV Fluids)   Intravenous Once Sharion Settler, NP   Held at 04/05/21 0647   0.9 %  sodium chloride infusion   Intravenous Continuous Para Skeans, MD 75 mL/hr at 04/05/21 1514 New Bag at 04/05/21 1514   cyclobenzaprine (FLEXERIL) tablet 5 mg  5 mg Oral TID PRN Gwynne Edinger, MD   5 mg at 04/05/21 1623   dexamethasone (DECADRON) tablet 40 mg  40 mg Oral Daily Earlie Server, MD   40 mg at 04/05/21 1511   gabapentin (NEURONTIN) capsule 400 mg  400 mg Oral TID Gwynne Edinger, MD       hydrALAZINE (APRESOLINE) injection 10 mg  10 mg Intravenous Q6H PRN Para Skeans, MD       insulin aspart (novoLOG) injection 0-9 Units  0-9 Units Subcutaneous TID WC Para Skeans, MD       lidocaine-EPINEPHrine (XYLOCAINE W/EPI) 1 %-1:100000 (with pres) injection 20 mL  20 mL Intradermal Once Ellice Boultinghouse F, MD       metoprolol succinate (TOPROL-XL) 24 hr tablet 50 mg  50 mg Oral Daily Wouk, Ailene Rud, MD       multivitamin with minerals tablet 1 tablet  1 tablet Oral Daily Gwynne Edinger, MD   1 tablet at 04/05/21 1334   oxyCODONE (Oxy IR/ROXICODONE) immediate release tablet 5 mg  5 mg Oral NOW Darrnell Mangiaracina F, MD       piperacillin-tazobactam (ZOSYN) IVPB 3.375 g  3.375 g Intravenous Q8H Florina Ou V, MD 12.5 mL/hr at 04/05/21 1335 3.375 g at 04/05/21 1335   protein supplement (ENSURE MAX) liquid  11 oz Oral BID Gwynne Edinger, MD   11 oz at 04/05/21 1334     Review of Systems Full ROS  was asked and was negative except for the information on the HPI  Physical Exam Blood pressure 120/81, pulse (!) 103, temperature 98.3 F (36.8 C), resp. rate 20, height 6\' 1"  (1.854 m), weight 98.3 kg, SpO2 97 %. CONSTITUTIONAL: NAD. EYES: Pupils are equal, round, aSclera are non-icteric. EARS, NOSE, MOUTH AND THROAT: Hearing is intact to  voice. LYMPH NODES:  Lymph nodes in the neck are normal. RESPIRATORY:  Lungs are clear. There is normal respiratory effort, with equal breath sounds bilaterally, and without pathologic use of accessory muscles. CARDIOVASCULAR: Heart is regular without murmurs, gallops, or rubs. GI: The abdomen is  soft, nontender, and nondistended. There are no palpable masses. There is no hepatosplenomegaly. There are normal bowel sounds in all quadrants. RectaL: there is evidence of a moderate sized left posterior lateral rectal abscess.  There is no evidence of necrotizing infection MUSCULOSKELETAL: Normal muscle strength and tone. No cyanosis or edema.   SKIN: Turgor is good and there are no pathologic skin lesions or ulcers. NEUROLOGIC: Motor and sensation is grossly normal. Cranial nerves are grossly intact. PSYCH:  Oriented to person, place and time. Affect is normal.  Data Reviewed  I have personally reviewed the patient's imaging, laboratory findings and medical records.    Assessment/Plan 76 year old male critically ill with severe thrombocytopenia and pancytopenia with decent sized perirectal abscess.  Difficult situation as he needs source control on the other hand he has no platelets for hemostasis.  He seems not to respond to platelet transfusion.  We will attempt a quick I&D at the bedside without being aggressive.  Recommend continuation of antibiotics.  Discussed with the patient in detail Although he is at high risk of bleeding he will need source control, he in agreement. He will need to continue broad spectrum antibitics   Procedure Note Incision and drainage of Left posterolateral perirectal abscess   Findings: small 2 cc purulence left posterolateral Unlikely source of severe sepsis  Anesthesia: lidocaine 1% w epi  Complications: None  EBL: minimal  After consent obtained pt placed lateral decubitus position, prep and draped in the standard fashion. Point of fluctuance  identified and using 11 blade knife abscess drained and cultures. Only 2 cc at most came out. I was unimpressed. Pressure placed and dressing applied, no complications.    Caroleen Hamman, MD FACS General Surgeon 04/05/2021, 4:52 PM

## 2021-04-05 NOTE — Progress Notes (Signed)
°  Transition of Care [facility]) Screening Note   Patient Details  Name: Eric Joyce Date of Birth: 03-31-1945   Transition of Care [facility]) CM/SW Contact:    Alberteen Sam, LCSW Phone Number: 04/05/2021, 9:15 AM    Transition of Care [facility]) has reviewed patient and no TOC needs have been identified at this time. We will continue to monitor patient advancement through interdisciplinary progression rounds. If new patient transition needs arise, please place a TOC consult.  [place], [place]

## 2021-04-05 NOTE — Assessment & Plan Note (Signed)
thiamine 100 mg.  etoh level.

## 2021-04-05 NOTE — Consult Note (Signed)
NAME: Eric Joyce  DOB: 03/27/1945  MRN: [id number]  Date/Time: 04/05/2021 1:54 PM  REQUESTING PROVIDER: Dr. Si Raider Subjective:  REASON FOR CONSULT: Thrombocytopenia, leukopenia ? Eric Joyce is a 76 y.o. male with a history of right TKA hyperlipidemia, diabetes mellitus, hypertension, CAD presents with 3-week history of feeling ill. Patient during Christmas drove to [place] to see his daughter who lives in [place] .  Because of the snowball and [place] he got held up in the [place] for 5 days and then [place] and drove back to [place] on 1 January. He was doing well until 9 January when he started having subjective fevers chills lack of energy and body aches.  He took a COVID test which was negative.  Patient never measured his temperature.  He took Tylenol and ibuprofen four each/ day.  He went to urgent care and was given amoxicillin.  On that day started having diarrhea.  Black in color.  More than 5-6 times a day.  He has also had hiccups for the past 3 days.  Continuous and convulsive.  He also has pain in his jaws since yesterday and inability to open his mouth completely.  His appetite was poor.  He lost about 20 pounds.  He went to see his PCP on 04/04/2021.  Lab work was sent and as he had a WBC of 1.3 and platelet of 10 he was asked to go to the ED. In the ED vitals temperature 98.6, BP 111/65, pulse 103, respiratory 15.  WBC 1.2, Hb 13.7 and platelet 10.  Creatinine was 0.73.  ALT was 1, AST 128, total bilirubin 2.1. CT abdomen pelvis revealed mild nonspecific circumferential rectal wall thickening with possible small perirectal abscess.  There was distal colonic diverticulosis without diverticulitis.  And splenomegaly.  Nonobstructing less than 5 mm left renal calculi. He was started on Zosyn He was seen by oncologist He also received platelet transfusion Am asked to see the patient for ruling out infection No history of sick contacts no history of contact with animals No history  of insect bites or tick bites No history of hiking or hunting or swimming  Past Medical History:  Diagnosis Date   Diabetes mellitus without complication ([place])    Hyperlipidemia    Hypertension    Myocardial infarction [facility])     Past Surgical History:  Procedure Laterality Date   APPENDECTOMY     CARDIAC SURGERY     2 stents   EYE SURGERY     REPLACEMENT TOTAL KNEE Right    TONSILLECTOMY AND ADENOIDECTOMY      Social History   Socioeconomic History   Marital status: Single    Spouse name: Not on file   Number of children: Not on file   Years of education: Not on file   Highest education level: Not on file  Occupational History   Not on file  Tobacco Use   Smoking status: Former    Types: Cigarettes   Smokeless tobacco: Never  Substance and Sexual Activity   Alcohol use: Not Currently   Drug use: Never   Sexual activity: Not on file  Other Topics Concern   Not on file  Social History Narrative   Not on file   Social Determinants of Health   Financial Resource Strain: Not on file  Food Insecurity: Not on file  Transportation Needs: Not on file  Physical Activity: Not on file  Stress: Not on file  Social Connections: Not on file  Intimate  Partner Violence: Not on file    Family History  Problem Relation Age of Onset   Lung cancer Mother    Heart disease Father    Diabetes Paternal Uncle    Diabetes Paternal Grandmother    Allergies  Allergen Reactions   Sulfamethoxazole-Trimethoprim    I? Current Facility-Administered Medications  Medication Dose Route Frequency Provider Last Rate Last Admin   0.9 %  sodium chloride infusion (Manually program via Guardrails IV Fluids)   Intravenous Once Sharion Settler, NP   Held at 04/05/21 0647   0.9 %  sodium chloride infusion   Intravenous Continuous Para Skeans, MD 75 mL/hr at 04/05/21 0027 New Bag at 04/05/21 0027   hydrALAZINE (APRESOLINE) injection 10 mg  10 mg Intravenous Q6H PRN Para Skeans, MD        insulin aspart (novoLOG) injection 0-9 Units  0-9 Units Subcutaneous TID WC Para Skeans, MD       multivitamin with minerals tablet 1 tablet  1 tablet Oral Daily Wouk, Ailene Rud, MD   1 tablet at 04/05/21 1334   piperacillin-tazobactam (ZOSYN) IVPB 3.375 g  3.375 g Intravenous Q8H Florina Ou V, MD 12.5 mL/hr at 04/05/21 1335 3.375 g at 04/05/21 1335   protein supplement (ENSURE MAX) liquid  11 oz Oral BID Gwynne Edinger, MD   11 oz at 04/05/21 1334     Abtx:  Anti-infectives (From admission, onward)    Start     Dose/Rate Route Frequency Ordered Stop   04/04/21 1830  piperacillin-tazobactam (ZOSYN) IVPB 3.375 g        3.375 g 12.5 mL/hr over 240 Minutes Intravenous Every 8 hours 04/04/21 1823         REVIEW OF SYSTEMS:  Const: Subjective fever,  chills,  weight loss Eyes: negative diplopia or visual changes, negative eye pain ENT: negative coryza, negative sore throat Resp: negative cough, hemoptysis, dyspnea Cards: negative for chest pain, palpitations, lower extremity edema GU: negative for frequency, dysuria and hematuria GI: Negative for abdominal pain, as diarrhea, no bleeding, constipation Skin: negative for rash and pruritus Heme: negative for easy bruising and gum/nose bleeding MS: Generalized weakness Neurolo:negative for headaches, dizziness, vertigo, memory problems  Psych: negative for feelings of anxiety, depression  Endocrine:  diabetes Allergy/Immunology-as above: Objective:  VITALS:  BP 120/81 (BP Location: Left Arm)    Pulse (!) 103    Temp 98.3 F (36.8 C)    Resp 20    Ht 6\' 1"  (1.854 m)    Wt 98.3 kg    SpO2 97%    BMI 28.59 kg/m  PHYSICAL EXAM:  General: Alert, cooperative, no distress, appears stated age.  Head: Normocephalic, without obvious abnormality, atraumatic. Eyes: Conjunctivae clear, anicteric sclerae. Pupils are equal ENT Nares normal. No drainage or sinus tenderness. Lips, mucosa, and tongue normal. No Thrush Neck: Supple,  symmetrical, no adenopathy, thyroid: non tender no carotid bruit and no JVD. Back: No CVA tenderness. Lungs: Clear to auscultation bilaterally. No Wheezing or Rhonchi. No rales. Heart: Regular rate and rhythm, no murmur, rub or gallop. Abdomen: Soft, non-tender,not distended. Bowel sounds normal. No masses Extremities: atraumatic, no cyanosis. No edema. No clubbing Skin: No rashes or lesions. Or bruising Lymph: Cervical, supraclavicular normal. Neurologic: Grossly non-focal Pertinent Labs Lab Results CBC    Component Value Date/Time   WBC 1.3 (LL) 04/05/2021 0936   RBC 4.05 (L) 04/05/2021 0936   HGB 12.3 (L) 04/05/2021 0936   HGB 11.3 (L) 05/20/2012 6301  HCT 35.6 (L) 04/05/2021 0936   HCT 45.9 04/27/2012 0853   PLT 5 (LL) 04/05/2021 0936   PLT 131 (L) 05/20/2012 0433   MCV 87.9 04/05/2021 0936   MCV 93 04/27/2012 0853   MCH 30.4 04/05/2021 0936   MCHC 34.6 04/05/2021 0936   RDW 13.6 04/05/2021 0936   RDW 12.7 04/27/2012 0853   LYMPHSABS 0.8 04/05/2021 0936   MONOABS 0.1 04/05/2021 0936   EOSABS 0.2 04/05/2021 0936   BASOSABS 0.0 04/05/2021 0936    CMP Latest Ref Rng & Units 04/04/2021 03/25/2013 05/20/2012  Glucose 70 - 99 mg/dL 202(H) 123(H) 110(H)  BUN 8 - 23 mg/dL 22 9 10   Creatinine 0.61 - 1.24 mg/dL 0.73 0.73 0.83  Sodium 135 - 145 mmol/L 131(L) 139 136  Potassium 3.5 - 5.1 mmol/L 3.7 3.7 3.8  Chloride 98 - 111 mmol/L 97(L) 107 104  CO2 22 - 32 mmol/L 24 25 25   Calcium 8.9 - 10.3 mg/dL 8.8(L) 8.7 7.8(L)  Total Protein 6.5 - 8.1 g/dL 5.9(L) - -  Total Bilirubin 0.3 - 1.2 mg/dL 2.1(H) - -  Alkaline Phos 38 - 126 U/L 133(H) - -  AST 15 - 41 U/L 128(H) - -  ALT 0 - 44 U/L 174(H) - -      Microbiology: Recent Results (from the past 240 hour(s))  Resp Panel by RT-PCR (Flu A&B, Covid) Nasopharyngeal Swab     Status: None   Collection Time: 04/04/21  4:03 PM   Specimen: Nasopharyngeal Swab; Nasopharyngeal(NP) swabs in vial transport medium  Result Value Ref Range  Status   SARS Coronavirus 2 by RT PCR NEGATIVE NEGATIVE Final    Comment: (NOTE) SARS-CoV-2 target nucleic acids are NOT DETECTED.  The SARS-CoV-2 RNA is generally detectable in upper respiratory specimens during the acute phase of infection. The lowest concentration of SARS-CoV-2 viral copies this assay can detect is 138 copies/mL. A negative result does not preclude SARS-Cov-2 infection and should not be used as the sole basis for treatment or other patient management decisions. A negative result may occur with  improper specimen collection/handling, submission of specimen other than nasopharyngeal swab, presence of viral mutation(s) within the areas targeted by this assay, and inadequate number of viral copies(<138 copies/mL). A negative result must be combined with clinical observations, patient history, and epidemiological information. The expected result is Negative.  Fact Sheet for Patients:  EntrepreneurPulse.com.au  Fact Sheet for Healthcare Providers:  IncredibleEmployment.be  This test is no t yet approved or cleared by the [place] FDA and  has been authorized for detection and/or diagnosis of SARS-CoV-2 by FDA under an Emergency Use Authorization (EUA). This EUA will remain  in effect (meaning this test can be used) for the duration of the COVID-19 declaration under Section 564(b)(1) of the Act, 21 U.S.C.section 360bbb-3(b)(1), unless the authorization is terminated  or revoked sooner.       Influenza A by PCR NEGATIVE NEGATIVE Final   Influenza B by PCR NEGATIVE NEGATIVE Final    Comment: (NOTE) The Xpert Xpress SARS-CoV-2/FLU/RSV plus assay is intended as an aid in the diagnosis of influenza from Nasopharyngeal swab specimens and should not be used as a sole basis for treatment. Nasal washings and aspirates are unacceptable for Xpert Xpress SARS-CoV-2/FLU/RSV testing.  Fact Sheet for  Patients: EntrepreneurPulse.com.au  Fact Sheet for Healthcare Providers: IncredibleEmployment.be  This test is not yet approved or cleared by the [place] FDA and has been authorized for detection and/or diagnosis of SARS-CoV-2 by FDA  under an Emergency Use Authorization (EUA). This EUA will remain in effect (meaning this test can be used) for the duration of the COVID-19 declaration under Section 564(b)(1) of the Act, 21 U.S.C. section 360bbb-3(b)(1), unless the authorization is terminated or revoked.  Performed at [facility], [address] [place], [place] [address]     IMAGING RESULTS: CT abdomen pelvis revealed mild nonspecific circumferential rectal wall thickening with possible small perirectal abscess.  There was distal colonic diverticulosis without diverticulitis.  And splenomegaly I have personally reviewed the films ? Impression/Recommendation 76 year old male presenting with subjective fever, fatigue, weight loss, diarrhea, and found to have severe thrombocytopenia and leukopenia and transaminitis.  He also has continuous hiccups and bilateral jaw pain.  Has splenomegaly.  Also found to have pulmonary embolism.  Wonder whether these all have an overarching diagnosis?. Malignancy versus infection From infectious perspective I will do the following work-up EBV DNA CMV DNA Bartonella antibodies RPR Ehrlichia antibody Q fever  Rule out ibuprofen induced  cytopenia and leukopenia/bone marrow suppression  There is diarrhea and because of continuous cleaning he has got soreness around the rectal area.  CT abdomen pelvis showed possible small abscess.  Continue Zosyn  We do not know his baseline lab work. Rule out cirrhosis but CT abdomen shows normal liver ___________________________________________________ Discussed with patient, and care team.  Note:  This document was prepared using Dragon voice recognition  software and may include unintentional dictation errors.

## 2021-04-05 NOTE — Progress Notes (Addendum)
Hematology/Oncology Progress note [phone] [phone]     Patient Care Team: Stuart as PCP - General (General Practice)   Name of the patient: Eric Joyce  [id number]  1945-04-24  Date of visit: 04/05/21   INTERVAL HISTORY-   04/04/2021, CT angio chest pulmonary embolism showed right lower lobe pulmonary emboli.  No right heart strain Anticoagulation was not started due to severe thrombocytopenia. Platelet dropped from [id number] this AM.  Patient was transfused with platelet transfusion however platelet count is refractory, posttransfusion platelet less than 5000. C diff is negative. GI panel is pending.     Patient reports that her diarrhea has slowed down.  No nausea vomiting.+ Hiccups Since admission, he is afebrile.  Allergies  Allergen Reactions   Sulfamethoxazole-Trimethoprim     Patient Active Problem List   Diagnosis Date Noted   Generalized weakness 04/04/2021   Hypophosphatemia 04/04/2021   Acute pulmonary embolism ([place]) 04/04/2021   Lactic acidosis 04/04/2021   SOB (shortness of breath) 04/04/2021   Melena 04/04/2021   Other neutropenia (HCC)    Thrombocytopenia (HCC)    Abscess of anal and rectal regions    Splenomegaly    Fever and chills    Diarrhea    Abdominal distension (gaseous) 01/18/2021   Actinic keratosis 01/18/2021   Alcoholism ([place]) 01/18/2021   Basal cell carcinoma 01/18/2021   Chronic post-traumatic stress disorder 01/18/2021   COVID-19 01/18/2021   Depression 01/18/2021   Diabetes mellitus with neuropathy ([place]) 01/18/2021   Encounter for immunization 01/18/2021   History of other malignant neoplasm of skin 79/89/2119   Metabolic syndrome 41/74/0814   Nephrolithiasis 01/18/2021   Nocturia 01/18/2021   Osteoarthrosis 01/18/2021   Other urticaria 01/18/2021   Presence of intraocular lens 01/18/2021   Rosacea 01/18/2021   Seborrheic dermatitis 01/18/2021   Viral warts 01/18/2021   Asymptomatic  varicose veins 10/19/2020   CAD (coronary artery disease) 10/19/2020   Diabetes mellitus type 2, uncomplicated ([place]) 48/18/5631   Hypertensive disorder 10/19/2020   Class 1 obesity due to excess calories without serious comorbidity with body mass index (BMI) of 30.0 to 30.9 in adult 10/04/2020   Diabetic neuropathy ([place]) 04/13/2020   Hav (hallux abducto valgus), unspecified laterality 04/13/2020   Other specified postprocedural states 01/18/2014   Myocardial infarction ([place]) 06/29/2002   Hyperlipidemia 03/04/2002   Insomnia 03/04/1968     Past Medical History:  Diagnosis Date   Diabetes mellitus without complication ([place])    Hyperlipidemia    Hypertension    Myocardial infarction [facility])      Past Surgical History:  Procedure Laterality Date   APPENDECTOMY     CARDIAC SURGERY     2 stents   EYE SURGERY     REPLACEMENT TOTAL KNEE Right    TONSILLECTOMY AND ADENOIDECTOMY      Current Facility-Administered Medications:    0.9 %  sodium chloride infusion (Manually program via Guardrails IV Fluids), , Intravenous, Once, Sharion Settler, NP, Held at 04/05/21 0647   0.9 %  sodium chloride infusion, , Intravenous, Continuous, Para Skeans, MD, Last Rate: 75 mL/hr at 04/05/21 0027, New Bag at 04/05/21 0027   dexamethasone (DECADRON) tablet 40 mg, 40 mg, Oral, Daily, Earlie Server, MD   hydrALAZINE (APRESOLINE) injection 10 mg, 10 mg, Intravenous, Q6H PRN, Para Skeans, MD   insulin aspart (novoLOG) injection 0-9 Units, 0-9 Units, Subcutaneous, TID WC, Para Skeans, MD   multivitamin with minerals tablet 1 tablet, 1 tablet, Oral, Daily, Wouk,  Ailene Rud, MD, 1 tablet at 04/05/21 1334   piperacillin-tazobactam (ZOSYN) IVPB 3.375 g, 3.375 g, Intravenous, Q8H, Para Skeans, MD, Last Rate: 12.5 mL/hr at 04/05/21 1335, 3.375 g at 04/05/21 1335   protein supplement (ENSURE MAX) liquid, 11 oz, Oral, BID, Wouk, Ailene Rud, MD, 11 oz at 04/05/21 1334   Physical exam:  Vitals:   04/05/21  0522 04/05/21 0523 04/05/21 0630 04/05/21 1142  BP: (!) 92/50 (!) 93/52 108/70 120/81  Pulse: 98 (!) 106 (!) 104 (!) 103  Resp: 15  17 20   Temp: 97.9 F (36.6 C) 97.7 F (36.5 C) 98.2 F (36.8 C) 98.3 F (36.8 C)  TempSrc: Oral Oral Oral   SpO2: 97% 98% 96% 97%  Weight:      Height:       Physical Exam Constitutional:      Appearance: He is not diaphoretic.  HENT:     Head: Normocephalic and atraumatic.     Nose: Nose normal.     Mouth/Throat:     Pharynx: No oropharyngeal exudate.  Eyes:     General: No scleral icterus.    Pupils: Pupils are equal, round, and reactive to light.  Cardiovascular:     Rate and Rhythm: Regular rhythm. Tachycardia present.     Heart sounds: No murmur heard. Pulmonary:     Effort: Pulmonary effort is normal. No respiratory distress.  Abdominal:     General: There is no distension.     Palpations: Abdomen is soft.     Tenderness: There is no abdominal tenderness.     Comments: + hiccup  Musculoskeletal:        General: Normal range of motion.     Cervical back: Normal range of motion and neck supple.  Skin:    General: Skin is warm and dry.     Findings: No erythema.  Neurological:     Mental Status: He is alert and oriented to person, place, and time.     Cranial Nerves: No cranial nerve deficit.     Motor: No abnormal muscle tone.     Coordination: Coordination normal.  Psychiatric:        Mood and Affect: Affect normal.       CMP Latest Ref Rng & Units 04/05/2021  Glucose 70 - 99 mg/dL -  BUN 8 - 23 mg/dL -  Creatinine 0.61 - 1.24 mg/dL -  Sodium 135 - 145 mmol/L -  Potassium 3.5 - 5.1 mmol/L -  Chloride 98 - 111 mmol/L -  CO2 22 - 32 mmol/L -  Calcium 8.9 - 10.3 mg/dL -  Total Protein 6.5 - 8.1 g/dL 6.2(L)  Total Bilirubin 0.3 - 1.2 mg/dL 2.1(H)  Alkaline Phos 38 - 126 U/L 131(H)  AST 15 - 41 U/L 108(H)  ALT 0 - 44 U/L 150(H)   CBC Latest Ref Rng & Units 04/05/2021  WBC 4.0 - 10.5 K/uL 1.2(LL)  Hemoglobin 13.0 - 17.0  g/dL 12.9(L)  Hematocrit 39.0 - 52.0 % 37.0(L)  Platelets 150 - 400 K/uL <5(LL)    RADIOGRAPHIC STUDIES: I have personally reviewed the radiological images as listed and agreed with the findings in the report. DG Chest 2 View  Result Date: 04/04/2021 CLINICAL DATA:  Generalized weakness, fever and chills for more than 3 weeks. Black stool and copious diarrhea. EXAM: CHEST - 2 VIEW COMPARISON:  None. FINDINGS: Normal sized heart. Tortuous aorta. Clear lungs. Thoracic spine degenerative changes. IMPRESSION: No acute abnormality. Electronically Signed   By:  Claudie Revering M.D.   On: 04/04/2021 17:06   CT Angio Chest Pulmonary Embolism (PE) W or WO Contrast  Result Date: 04/04/2021 CLINICAL DATA:  Weakness and fever for several weeks, initial encounter EXAM: CT ANGIOGRAPHY CHEST WITH CONTRAST TECHNIQUE: Multidetector CT imaging of the chest was performed using the standard protocol during bolus administration of intravenous contrast. Multiplanar CT image reconstructions and MIPs were obtained to evaluate the vascular anatomy. RADIATION DOSE REDUCTION: This exam was performed according to the departmental dose-optimization program which includes automated exposure control, adjustment of the mA and/or kV according to patient size and/or use of iterative reconstruction technique. CONTRAST:  34mL OMNIPAQUE IOHEXOL 350 MG/ML SOLN COMPARISON:  None. FINDINGS: Cardiovascular: Thoracic aorta shows a normal branching pattern. No aneurysmal dilatation or dissection is noted. No cardiac enlargement is seen. Coronary calcifications are noted. The pulmonary artery shows a normal branching pattern bilaterally. Mild right lower lobe pulmonary emboli are seen posteriorly. No right heart strain is noted. Mediastinum/Nodes: Thoracic inlet is within normal limits. No sizable hilar or mediastinal adenopathy is noted. The esophagus is within normal limits. Lungs/Pleura: Lungs demonstrate mild dependent atelectatic changes. No  sizable parenchymal nodules are noted. Upper Abdomen: Visualized upper abdomen is within normal limits. Musculoskeletal: Degenerative changes of the thoracic spine are noted. No acute rib abnormality is noted. Review of the MIP images confirms the above findings. IMPRESSION: Changes consistent with right lower lobe pulmonary emboli. No right heart strain is noted. Mild dependent atelectatic changes. Critical Value/emergent results were communicated the Amion at the time of interpretation on 04/04/2021 at 10:28 Pm to Dr. Florina Ou , who verbally acknowledged these results. Electronically Signed   By: Inez Catalina M.D.   On: 04/04/2021 22:32   CT ABDOMEN PELVIS W CONTRAST  Result Date: 04/04/2021 CLINICAL DATA:  Weakness, fever, chills for 3 weeks, dark stool, diarrhea EXAM: CT ABDOMEN AND PELVIS WITH CONTRAST TECHNIQUE: Multidetector CT imaging of the abdomen and pelvis was performed using the standard protocol following bolus administration of intravenous contrast. RADIATION DOSE REDUCTION: This exam was performed according to the departmental dose-optimization program which includes automated exposure control, adjustment of the mA and/or kV according to patient size and/or use of iterative reconstruction technique. CONTRAST:  121mL OMNIPAQUE IOHEXOL 300 MG/ML  SOLN COMPARISON:  None. FINDINGS: Lower chest: No acute pleural or parenchymal lung disease. Hepatobiliary: No focal liver abnormality is seen. No gallstones, gallbladder wall thickening, or biliary dilatation. Pancreas: Unremarkable. No pancreatic ductal dilatation or surrounding inflammatory changes. Spleen: The spleen is enlarged measuring 15.5 x 15.0 x 5.7 cm. No focal parenchymal abnormalities. Adrenals/Urinary Tract: There are 2 sub 5 mm nonobstructing calculi within the lower pole left kidney. No right-sided calculi. Small bilateral renal cortical cysts. No obstructive uropathy. Bladder is unremarkable. The adrenals are normal. Stomach/Bowel: No  bowel obstruction or ileus. Scattered diverticulosis of the sigmoid colon without evidence of diverticulitis. Mild circumferential rectal wall thickening. There is a 1.7 x 1.2 cm low-attenuation region dorsal to the anal verge reference image 100/3, which could reflect a small perirectal abscess. Please correlate with physical exam findings. No inflammatory changes. Vascular/Lymphatic: Aortic atherosclerosis. No enlarged abdominal or pelvic lymph nodes. Numerous subcentimeter mesenteric and retroperitoneal lymph nodes are nonspecific. Reproductive: The prostate is mildly enlarged. Surgical clips are seen within the ventral aspect of the prostate. Other: No free fluid or free intraperitoneal gas. There is a small fat containing umbilical hernia. No bowel herniation. Musculoskeletal: No acute or destructive bony lesions. Reconstructed images demonstrate no additional  findings. IMPRESSION: 1. Mild nonspecific circumferential rectal wall thickening, with possible small perirectal abscess dorsal to the anal verge. Please correlate with physical exam findings. 2. Distal colonic diverticulosis without diverticulitis. 3. Nonobstructing less than 5 mm left renal calculi. 4. Splenomegaly. 5. Small fat containing umbilical hernia.  No bowel herniation. 6.  Aortic Atherosclerosis (ICD10-I70.0). Electronically Signed   By: Randa Ngo M.D.   On: 04/04/2021 17:50   US ARTERIAL ABI (SCREENING LOWER EXTREMITY)  Result Date: 04/05/2021 CLINICAL DATA:  Decreased pedal pulses. EXAM: NONINVASIVE PHYSIOLOGIC VASCULAR STUDY OF BILATERAL LOWER EXTREMITIES TECHNIQUE: Evaluation of both lower extremities were performed at rest, including calculation of ankle-brachial indices with single level Doppler, pressure and pulse volume recording. COMPARISON:  None. FINDINGS: Right ABI:  1.15 Left ABI:  1.26 Right Lower Extremity:  Irregular waveforms. Left Lower Extremity:  Normal arterial waveforms at the ankle. 1.0-1.4 Normal IMPRESSION:  Normal resting ankle-brachial indices bilaterally. Electronically Signed   By: Markus Daft M.D.   On: 04/05/2021 08:44   CT Maxillofacial W Contrast  Result Date: 04/04/2021 CLINICAL DATA:  Bilateral TMJ with limited movement. Mouth pain. Possible malignancy. Fever and chills 3 weeks. EXAM: CT MAXILLOFACIAL WITH CONTRAST TECHNIQUE: Multidetector CT imaging of the maxillofacial structures was performed with intravenous contrast. Multiplanar CT image reconstructions were also generated. RADIATION DOSE REDUCTION: This exam was performed according to the departmental dose-optimization program which includes automated exposure control, adjustment of the mA and/or kV according to patient size and/or use of iterative reconstruction technique. CONTRAST:  11mL OMNIPAQUE IOHEXOL 300 MG/ML  SOLN COMPARISON:  None. FINDINGS: Osseous: Normal TMJ bilaterally. Normal alignment and no degenerative change. No lesion in the mandible. Mild periapical lucency around upper incisors bilaterally. Orbits: Negative for orbital mass or edema. Bilateral cataract extraction. Sinuses: Mucosal edema filling much of the left sphenoid sinus with bony thickening of the left sphenoid sinus. Remaining sinuses clear. Mastoid and middle ear clear bilaterally. Soft tissues: No soft tissue mass. No evidence of cellulitis or soft tissue abscess. Submandibular parotid glands normal bilaterally. Normal pharynx. Limited thyroid negative Limited intracranial: No acute abnormality. IMPRESSION: Normal TMJ bilaterally.  No dislocation or degenerative change. Small periapical lucency around upper incisors bilaterally compatible with dental infection. No evidence of soft tissue cellulitis or soft tissue abscess. No evidence of mass or malignancy Mucosal edema and bony thickening left sphenoid sinus. Electronically Signed   By: Franchot Gallo M.D.   On: 04/04/2021 17:59   US Abdomen Limited RUQ (LIVER/GB)  Result Date: 04/04/2021 CLINICAL DATA:  Elevated LFTs  EXAM: ULTRASOUND ABDOMEN LIMITED RIGHT UPPER QUADRANT COMPARISON:  None. FINDINGS: Gallbladder: Moderate to large amount of echogenic sludge. No shadowing calculi visualized. No significant wall thickening or pericholecystic edema. Negative sonographic Murphy's sign. Common bile duct: Diameter: 3 mm Liver: No focal lesion identified. Within normal limits in parenchymal echogenicity. Portal vein is patent on color Doppler imaging with normal direction of blood flow towards the liver. Other: None. IMPRESSION: Gallbladder sludge. Electronically Signed   By: Ofilia Neas M.D.   On: 04/04/2021 20:51    Assessment and plan-   #Thrombocytopenia, which is refractory to platelet transfusion, splenomegaly. Increased immature platelet fraction, indicating appropriate bone marrow response, possible peripheral blood destruction, secondary to splenomegaly and/or autoimmune or drug-induced ITP. Etiology of splenomegaly is unknown.  Peripheral flow cytometry is pending.  CRP is elevated.  Adequate vitamin B12 and folate. ANA is pending.  Normal fibrinogen.  Negative hepatitis and HIV.  Smear showed no schistocytes.  No significant  reticulocytosis, indirect bilirubin less than 2, [id number] was sent already. Plasmic score does not apply due to lack of schistocytes. I will repeat a blood smear.  ID has ordered additional infectious work-up which is in process. I recommend to transfuse platelet if count is less than 10,000. Recommend empiric steroid with dexamethasone 40 mg daily.  Started today.   #Leukopenia, predominantly neutropenia unknown baseline.   ANC 0.3, slightly improved.  Possible due to acute infection or due to underlying malignancy.  Work-up in progress.   #Elevated LFT, negative hepatitis panel. Ultrasound abdomen showed sludge gallbladder.  Parenchymal echogenicity within normal limits.  Patent portal vein.   #Pulmonary embolism, anticoagulation is contraindicated due to severe thrombocytopenia.   No signs of hypoxia.  Continue close monitor.  Check lower extremity ultrasound to rule out DVT.  #Rectal wall thickening/rectal abscess.-Surgery evaluation.  Continue antibiotics.  On piperacillin. #Hiccups, patient has been started on Flexeril.   Thank you for allowing me to participate in the care of this patient.  Plan was discussed with Dr. Si Raider and Dr. Delaine Lame.   Earlie Server, MD, PhD Hematology Oncology  04/05/2021

## 2021-04-05 NOTE — Assessment & Plan Note (Signed)
Acute thrombocytopenia presenting  With Diarrheal illness I suspect pt may have had HUS with shiga toxin and associated with diarrhea. D/d also include hematological malignancy.  We will hold off on antiplatelet and anticoagulation as pt is at high bleeding risk.  Await for heme onc recommendation .

## 2021-04-05 NOTE — Assessment & Plan Note (Signed)
?   HUS/ TTP.  GI panel PCR for stool studies.

## 2021-04-06 ENCOUNTER — Inpatient Hospital Stay: Payer: PPO

## 2021-04-06 DIAGNOSIS — D708 Other neutropenia: Secondary | ICD-10-CM | POA: Diagnosis not present

## 2021-04-06 DIAGNOSIS — R531 Weakness: Secondary | ICD-10-CM | POA: Diagnosis not present

## 2021-04-06 DIAGNOSIS — K611 Rectal abscess: Secondary | ICD-10-CM

## 2021-04-06 DIAGNOSIS — I2699 Other pulmonary embolism without acute cor pulmonale: Secondary | ICD-10-CM | POA: Diagnosis not present

## 2021-04-06 DIAGNOSIS — K612 Anorectal abscess: Secondary | ICD-10-CM | POA: Diagnosis not present

## 2021-04-06 DIAGNOSIS — D696 Thrombocytopenia, unspecified: Secondary | ICD-10-CM | POA: Diagnosis not present

## 2021-04-06 LAB — BASIC METABOLIC PANEL
Anion gap: 8 (ref 5–15)
BUN: 19 mg/dL (ref 8–23)
CO2: 22 mmol/L (ref 22–32)
Calcium: 7.9 mg/dL — ABNORMAL LOW (ref 8.9–10.3)
Chloride: 100 mmol/L (ref 98–111)
Creatinine, Ser: 0.64 mg/dL (ref 0.61–1.24)
GFR, Estimated: >60 mL/min (ref >60)
Glucose, Bld: 255 mg/dL — ABNORMAL HIGH (ref 70–99)
Potassium: 4 mmol/L (ref 3.5–5.1)
Sodium: 130 mmol/L — ABNORMAL LOW (ref 135–145)

## 2021-04-06 LAB — CBC
HCT: 34.5 % — ABNORMAL LOW (ref 39.0–52.0)
Hemoglobin: 12.2 g/dL — ABNORMAL LOW (ref 13.0–17.0)
MCH: 31.4 pg (ref 26.0–34.0)
MCHC: 35.4 g/dL (ref 30.0–36.0)
MCV: 88.7 fL (ref 80.0–100.0)
Platelets: 6 10*3/uL — CL (ref 150–400)
RBC: 3.89 MIL/uL — ABNORMAL LOW (ref 4.22–5.81)
RDW: 13.9 % (ref 11.5–15.5)
WBC: 1.4 10*3/uL — CL (ref 4.0–10.5)
nRBC: 0 % (ref 0.0–0.2)

## 2021-04-06 LAB — BPAM PLATELET PHERESIS
Blood Product Expiration Date: 202302032359
ISSUE DATE / TIME: 202302020443
Unit Type and Rh: 6200

## 2021-04-06 LAB — CBC WITH DIFFERENTIAL/PLATELET
Abs Immature Granulocytes: 0.05 10*3/uL (ref 0.00–0.07)
Basophils Absolute: 0 10*3/uL (ref 0.0–0.1)
Basophils Relative: 1 %
Eosinophils Absolute: 0 10*3/uL (ref 0.0–0.5)
Eosinophils Relative: 1 %
HCT: 35 % — ABNORMAL LOW (ref 39.0–52.0)
Hemoglobin: 12.2 g/dL — ABNORMAL LOW (ref 13.0–17.0)
Immature Granulocytes: 5 %
Lymphocytes Relative: 56 %
Lymphs Abs: 0.6 10*3/uL — ABNORMAL LOW (ref 0.7–4.0)
MCH: 30.2 pg (ref 26.0–34.0)
MCHC: 34.9 g/dL (ref 30.0–36.0)
MCV: 86.6 fL (ref 80.0–100.0)
Monocytes Absolute: 0.1 10*3/uL (ref 0.1–1.0)
Monocytes Relative: 8 %
Neutro Abs: 0.3 10*3/uL — CL (ref 1.7–7.7)
Neutrophils Relative %: 29 %
Platelets: 5 10*3/uL — CL (ref 150–400)
RBC: 4.04 MIL/uL — ABNORMAL LOW (ref 4.22–5.81)
RDW: 13.7 % (ref 11.5–15.5)
Smear Review: NORMAL
WBC: 1.1 10*3/uL — CL (ref 4.0–10.5)
nRBC: 0 % (ref 0.0–0.2)

## 2021-04-06 LAB — GLUCOSE, CAPILLARY
Glucose-Capillary: 241 mg/dL — ABNORMAL HIGH (ref 70–99)
Glucose-Capillary: 238 mg/dL — ABNORMAL HIGH (ref 70–99)
Glucose-Capillary: 230 mg/dL — ABNORMAL HIGH (ref 70–99)
Glucose-Capillary: 198 mg/dL — ABNORMAL HIGH (ref 70–99)

## 2021-04-06 LAB — GASTROINTESTINAL PANEL BY PCR, STOOL (REPLACES STOOL CULTURE)

## 2021-04-06 LAB — ADAMTS13 ACTIVITY: Adamts 13 Activity: 37.1 % — ABNORMAL LOW (ref >66.8)

## 2021-04-06 LAB — EPSTEIN-BARR VIRUS (EBV) ANTIBODY PROFILE
EBV NA IgG: >600 U/mL — ABNORMAL HIGH (ref 0.0–17.9)
EBV VCA IgG: >600 U/mL — ABNORMAL HIGH (ref 0.0–17.9)
EBV VCA IgM: <36 U/mL (ref 0.0–35.9)

## 2021-04-06 LAB — RPR: RPR Ser Ql: NONREACTIVE

## 2021-04-06 LAB — PREPARE PLATELET PHERESIS: Unit division: 0

## 2021-04-06 LAB — PATHOLOGIST SMEAR REVIEW

## 2021-04-06 LAB — ANA W/REFLEX: Anti Nuclear Antibody (ANA): NEGATIVE

## 2021-04-06 LAB — HEMOGLOBIN A1C
Hgb A1c MFr Bld: 6.6 % — ABNORMAL HIGH (ref 4.8–5.6)
Mean Plasma Glucose: 143 mg/dL

## 2021-04-06 LAB — ADAMTS13 ACTIVITY REFLEX

## 2021-04-06 LAB — LYME DISEASE SEROLOGY W/REFLEX: Lyme Total Antibody EIA: NEGATIVE

## 2021-04-06 LAB — HAPTOGLOBIN: Haptoglobin: 114 mg/dL (ref 34–355)

## 2021-04-06 MED ORDER — INSULIN ASPART 100 UNIT/ML IJ SOLN
0.0000 [IU] | Freq: Every day | INTRAMUSCULAR | Status: DC
  Administered 2021-04-07 – 2021-04-08 (×2): 2 [IU] via SUBCUTANEOUS
  Filled 2021-04-06 (×2): qty 1

## 2021-04-06 MED ORDER — ONDANSETRON HCL 4 MG/2ML IJ SOLN
4.0000 mg | INTRAMUSCULAR | Status: DC | PRN
  Administered 2021-04-06 (×3): 4 mg via INTRAVENOUS
  Filled 2021-04-06 (×3): qty 2

## 2021-04-06 MED ORDER — PANTOPRAZOLE SODIUM 20 MG PO TBEC
20.0000 mg | DELAYED_RELEASE_TABLET | Freq: Every day | ORAL | Status: DC
  Administered 2021-04-06 – 2021-04-07 (×2): 20 mg via ORAL
  Filled 2021-04-06 (×2): qty 1

## 2021-04-06 MED ORDER — INSULIN ASPART 100 UNIT/ML IJ SOLN
0.0000 [IU] | Freq: Three times a day (TID) | INTRAMUSCULAR | Status: DC
  Administered 2021-04-06 – 2021-04-07 (×5): 7 [IU] via SUBCUTANEOUS
  Administered 2021-04-08: 11 [IU] via SUBCUTANEOUS
  Administered 2021-04-08 (×2): 7 [IU] via SUBCUTANEOUS
  Administered 2021-04-09 (×3): 4 [IU] via SUBCUTANEOUS
  Administered 2021-04-10: 3 [IU] via SUBCUTANEOUS
  Administered 2021-04-10: 4 [IU] via SUBCUTANEOUS
  Filled 2021-04-06 (×13): qty 1

## 2021-04-06 MED ORDER — SODIUM CHLORIDE 0.9% IV SOLUTION: Freq: Once | INTRAVENOUS | Status: AC

## 2021-04-06 MED ORDER — INSULIN GLARGINE-YFGN 100 UNIT/ML ~~LOC~~ SOLN
10.0000 [IU] | Freq: Every day | SUBCUTANEOUS | Status: DC
  Administered 2021-04-06 – 2021-04-07 (×2): 10 [IU] via SUBCUTANEOUS
  Filled 2021-04-06 (×2): qty 0.1

## 2021-04-06 NOTE — Progress Notes (Signed)
Inpatient Diabetes Program Recommendations  AACE/ADA: New Consensus Statement on Inpatient Glycemic Control (2015)  Target Ranges:  Prepandial:   less than 140 mg/dL      Peak postprandial:   less than 180 mg/dL (1-2 hours)      Critically ill patients:  140 - 180 mg/dL   Lab Results  Component Value Date   GLUCAP 241 (H) 04/06/2021   HGBA1C 6.6 (H) 04/05/2021    Review of Glycemic Control  Latest Reference Range & Units 04/05/21 09:17 04/05/21 16:24 04/05/21 20:36 04/06/21 09:41  Glucose-Capillary 70 - 99 mg/dL 159 (H) 166 (H) 204 (H) 241 (H)  (H): Data is abnormally high  Diabetes history: DM2 Outpatient Diabetes medications: Metformin 500 mg bid Current orders for Inpatient glycemic control: Novolog 0-9 units correction tid, Decadron 40 mg qd  Inpatient Diabetes Program Recommendations:   While on steroids, consider: -Increase Novolog correction to q 4 hrs. Secure chat to Dr. Si Raider.  Thank you, Nani Gasser. Earline Stiner, RN, MSN, CDE  Diabetes Coordinator Inpatient Glycemic Control Team Team Pager [phone] (8am-5pm) 04/06/2021 10:32 AM

## 2021-04-06 NOTE — Progress Notes (Addendum)
Hematology/Oncology Progress note [phone] [phone]     Patient Care Team: [facility]CP - General (General Practice)   Name of the patient: Eric Joyce  [id number]  [birth year]  Date of visit: 04/06/21   INTERVAL HISTORY-  Patient reports that he feels the same as yesterday.  Diarrhea has improved.  He is afebrile.  Appetite is poor. Continues to have hiccups.   Allergies  Allergen Reactions   Sulfamethoxazole-Trimethoprim     Patient Active Problem List   Diagnosis Date Noted   Generalized weakness 04/04/2021   Hypophosphatemia 04/04/2021   Acute pulmonary embolism (HCC) 04/04/2021   Lactic acidosis 04/04/2021   SOB (shortness of breath) 04/04/2021   Melena 04/04/2021   Other neutropenia (HCC)    Thrombocytopenia (HCC)    Abscess of anal and rectal regions    Splenomegaly    Fever and chills    Diarrhea    Abdominal distension (gaseous) 01/18/2021   Actinic keratosis 01/18/2021   Alcoholism (HCC) 01/18/2021   Basal cell carcinoma 01/18/2021   Chronic post-traumatic stress disorder 01/18/2021   COVID-19 01/18/2021   Depression 01/18/2021   Diabetes mellitus with neuropathy (HCC) 01/18/2021   Encounter for immunization 01/18/2021   History of other malignant neoplasm of skin 01/18/2021   Metabolic syndrome 01/18/2021   Nephrolithiasis 01/18/2021   Nocturia 01/18/2021   Osteoarthrosis 01/18/2021   Other urticaria 01/18/2021   Presence of intraocular lens 01/18/2021   Rosacea 01/18/2021   Seborrheic dermatitis 01/18/2021   Viral warts 01/18/2021   Asymptomatic varicose veins 10/19/2020   CAD (coronary artery disease) 10/19/2020   Diabetes mellitus type 2, uncomplicated (HCC) 10/19/2020   Hypertensive disorder 10/19/2020   Class 1 obesity due to excess calories without serious comorbidity with body mass index (BMI) of 30.0 to 30.9 in adult 10/04/2020   Diabetic neuropathy (HCC) 04/13/2020   Hav (hallux abducto  valgus), unspecified laterality 04/13/2020   Other specified postprocedural states 01/18/2014   Myocardial infarction (HCC) 06/29/2002   Hyperlipidemia 03/04/2002   Insomnia 03/04/1968     Past Medical History:  Diagnosis Date   Diabetes mellitus without complication (HCC)    Hyperlipidemia    Hypertension    Myocardial infarction (HCC)      Past Surgical History:  Procedure Laterality Date   APPENDECTOMY     CARDIAC SURGERY     2 stents   EYE SURGERY     REPLACEMENT TOTAL KNEE Right    TONSILLECTOMY AND ADENOIDECTOMY      Current Facility-Administered Medications:    0.9 %  sodium chloride infusion (Manually program via Guardrails IV Fluids), , Intravenous, Once, Manuela Schwartz, NP, Held at 04/05/21 0647   0.9 %  sodium chloride infusion (Manually program via Guardrails IV Fluids), , Intravenous, Once, Wouk, [employer], MD   0.9 %  sodium chloride infusion, , Intravenous, Continuous, Irena Cords V, MD, Last Rate: 75 mL/hr at 04/06/21 0508, New Bag at 04/06/21 0508   cyclobenzaprine (FLEXERIL) tablet 5 mg, 5 mg, Oral, TID PRN, Ashok Pall, Wilfred Curtis, MD, 5 mg at 04/06/21 0034   dexamethasone (DECADRON) injection 40 mg, 40 mg, Intravenous, Daily, Rickard Patience, MD, 40 mg at 04/06/21 0957   gabapentin (NEURONTIN) capsule 400 mg, 400 mg, Oral, TID, Wouk, Wilfred Curtis, MD, 400 mg at 04/06/21 1732   hydrALAZINE (APRESOLINE) injection 10 mg, 10 mg, Intravenous, Q6H PRN, Gertha Calkin, MD   insulin aspart (novoLOG) injection 0-20 Units, 0-20 Units, Subcutaneous, TID WC, Wouk, [place],  MD, 7 Units at 04/06/21 1731   insulin aspart (novoLOG) injection 0-5 Units, 0-5 Units, Subcutaneous, QHS, Wouk, Ailene Rud, MD   insulin glargine-yfgn [facility]) injection 10 Units, 10 Units, Subcutaneous, Daily, Wouk, Ailene Rud, MD, 10 Units at 04/06/21 1209   metoprolol succinate (TOPROL-XL) 24 hr tablet 50 mg, 50 mg, Oral, Daily, Wouk, Ailene Rud, MD, 50 mg at 04/06/21 0957   multivitamin with  minerals tablet 1 tablet, 1 tablet, Oral, Daily, Wouk, Ailene Rud, MD, 1 tablet at 04/06/21 0957   ondansetron [facility]) injection 4 mg, 4 mg, Intravenous, Q4H PRN, Mansy, Jan A, MD, 4 mg at 04/06/21 1106   oxyCODONE (Oxy IR/ROXICODONE) immediate release tablet 5 mg, 5 mg, Oral, Q6H PRN, Wouk, Ailene Rud, MD   pantoprazole (PROTONIX) EC tablet 20 mg, 20 mg, Oral, Daily, Wouk, Ailene Rud, MD, 20 mg at 04/06/21 1209   piperacillin-tazobactam (ZOSYN) IVPB 3.375 g, 3.375 g, Intravenous, Q8H, Para Skeans, MD, Last Rate: 12.5 mL/hr at 04/06/21 1449, 3.375 g at 04/06/21 1449   protein supplement (ENSURE MAX) liquid, 11 oz, Oral, BID, Gwynne Edinger, MD, 11 oz at 04/06/21 0958   Physical exam:  Vitals:   04/06/21 1146 04/06/21 1201 04/06/21 1445 04/06/21 1621  BP: 133/81 132/87 (!) 142/83 (!) 143/80  Pulse: 82 87 73 90  Resp: $Remo'20 20 18 19  'AGGVa$ Temp: 98.3 F (36.8 C) 98.3 F (36.8 C) 98.4 F (36.9 C) 98.3 F (36.8 C)  TempSrc:  Oral Oral   SpO2: 96% 98% 97% 96%  Weight:      Height:       Physical Exam Constitutional:      Appearance: He is not diaphoretic.  HENT:     Head: Normocephalic and atraumatic.     Nose: Nose normal.     Mouth/Throat:     Pharynx: No oropharyngeal exudate.  Eyes:     General: No scleral icterus.    Pupils: Pupils are equal, round, and reactive to light.  Cardiovascular:     Rate and Rhythm: Normal rate and regular rhythm.     Heart sounds: No murmur heard. Pulmonary:     Effort: Pulmonary effort is normal. No respiratory distress.  Abdominal:     General: There is no distension.     Palpations: Abdomen is soft.     Tenderness: There is no abdominal tenderness.     Comments: + hiccup  Musculoskeletal:        General: Normal range of motion.     Cervical back: Normal range of motion and neck supple.  Skin:    General: Skin is warm and dry.     Findings: No erythema.     Comments: No significant petechia or bruising  Neurological:     Mental  Status: He is alert and oriented to person, place, and time.     Cranial Nerves: No cranial nerve deficit.     Motor: No abnormal muscle tone.     Coordination: Coordination normal.  Psychiatric:        Mood and Affect: Affect normal.       CMP Latest Ref Rng & Units 04/06/2021  Glucose 70 - 99 mg/dL 255(H)  BUN 8 - 23 mg/dL 19  Creatinine 0.61 - 1.24 mg/dL 0.64  Sodium 135 - 145 mmol/L 130(L)  Potassium 3.5 - 5.1 mmol/L 4.0  Chloride 98 - 111 mmol/L 100  CO2 22 - 32 mmol/L 22  Calcium 8.9 - 10.3 mg/dL 7.9(L)  Total Protein 6.5 -  8.1 g/dL -  Total Bilirubin 0.3 - 1.2 mg/dL -  Alkaline Phos 38 - 126 U/L -  AST 15 - 41 U/L -  ALT 0 - 44 U/L -   CBC Latest Ref Rng & Units 04/06/2021  WBC 4.0 - 10.5 K/uL 1.4(LL)  Hemoglobin 13.0 - 17.0 g/dL 12.2(L)  Hematocrit 39.0 - 52.0 % 34.5(L)  Platelets 150 - 400 K/uL 6(LL)    RADIOGRAPHIC STUDIES: I have personally reviewed the radiological images as listed and agreed with the findings in the report. DG Chest 2 View  Result Date: 04/04/2021 CLINICAL DATA:  Generalized weakness, fever and chills for more than 3 weeks. Black stool and copious diarrhea. EXAM: CHEST - 2 VIEW COMPARISON:  None. FINDINGS: Normal sized heart. Tortuous aorta. Clear lungs. Thoracic spine degenerative changes. IMPRESSION: No acute abnormality. Electronically Signed   By: Claudie Revering M.D.   On: 04/04/2021 17:06   CT Angio Chest Pulmonary Embolism (PE) W or WO Contrast  Result Date: 04/04/2021 CLINICAL DATA:  Weakness and fever for several weeks, initial encounter EXAM: CT ANGIOGRAPHY CHEST WITH CONTRAST TECHNIQUE: Multidetector CT imaging of the chest was performed using the standard protocol during bolus administration of intravenous contrast. Multiplanar CT image reconstructions and MIPs were obtained to evaluate the vascular anatomy. RADIATION DOSE REDUCTION: This exam was performed according to the departmental dose-optimization program which includes automated  exposure control, adjustment of the mA and/or kV according to patient size and/or use of iterative reconstruction technique. CONTRAST:  50mL OMNIPAQUE IOHEXOL 350 MG/ML SOLN COMPARISON:  None. FINDINGS: Cardiovascular: Thoracic aorta shows a normal branching pattern. No aneurysmal dilatation or dissection is noted. No cardiac enlargement is seen. Coronary calcifications are noted. The pulmonary artery shows a normal branching pattern bilaterally. Mild right lower lobe pulmonary emboli are seen posteriorly. No right heart strain is noted. Mediastinum/Nodes: Thoracic inlet is within normal limits. No sizable hilar or mediastinal adenopathy is noted. The esophagus is within normal limits. Lungs/Pleura: Lungs demonstrate mild dependent atelectatic changes. No sizable parenchymal nodules are noted. Upper Abdomen: Visualized upper abdomen is within normal limits. Musculoskeletal: Degenerative changes of the thoracic spine are noted. No acute rib abnormality is noted. Review of the MIP images confirms the above findings. IMPRESSION: Changes consistent with right lower lobe pulmonary emboli. No right heart strain is noted. Mild dependent atelectatic changes. Critical Value/emergent results were communicated the Amion at the time of interpretation on 04/04/2021 at 10:28 Pm to Dr. Florina Ou , who verbally acknowledged these results. Electronically Signed   By: Inez Catalina M.D.   On: 04/04/2021 22:32   CT ABDOMEN PELVIS W CONTRAST  Result Date: 04/04/2021 CLINICAL DATA:  Weakness, fever, chills for 3 weeks, dark stool, diarrhea EXAM: CT ABDOMEN AND PELVIS WITH CONTRAST TECHNIQUE: Multidetector CT imaging of the abdomen and pelvis was performed using the standard protocol following bolus administration of intravenous contrast. RADIATION DOSE REDUCTION: This exam was performed according to the departmental dose-optimization program which includes automated exposure control, adjustment of the mA and/or kV according to patient  size and/or use of iterative reconstruction technique. CONTRAST:  192mL OMNIPAQUE IOHEXOL 300 MG/ML  SOLN COMPARISON:  None. FINDINGS: Lower chest: No acute pleural or parenchymal lung disease. Hepatobiliary: No focal liver abnormality is seen. No gallstones, gallbladder wall thickening, or biliary dilatation. Pancreas: Unremarkable. No pancreatic ductal dilatation or surrounding inflammatory changes. Spleen: The spleen is enlarged measuring 15.5 x 15.0 x 5.7 cm. No focal parenchymal abnormalities. Adrenals/Urinary Tract: There are 2 sub 5 mm  nonobstructing calculi within the lower pole left kidney. No right-sided calculi. Small bilateral renal cortical cysts. No obstructive uropathy. Bladder is unremarkable. The adrenals are normal. Stomach/Bowel: No bowel obstruction or ileus. Scattered diverticulosis of the sigmoid colon without evidence of diverticulitis. Mild circumferential rectal wall thickening. There is a 1.7 x 1.2 cm low-attenuation region dorsal to the anal verge reference image 100/3, which could reflect a small perirectal abscess. Please correlate with physical exam findings. No inflammatory changes. Vascular/Lymphatic: Aortic atherosclerosis. No enlarged abdominal or pelvic lymph nodes. Numerous subcentimeter mesenteric and retroperitoneal lymph nodes are nonspecific. Reproductive: The prostate is mildly enlarged. Surgical clips are seen within the ventral aspect of the prostate. Other: No free fluid or free intraperitoneal gas. There is a small fat containing umbilical hernia. No bowel herniation. Musculoskeletal: No acute or destructive bony lesions. Reconstructed images demonstrate no additional findings. IMPRESSION: 1. Mild nonspecific circumferential rectal wall thickening, with possible small perirectal abscess dorsal to the anal verge. Please correlate with physical exam findings. 2. Distal colonic diverticulosis without diverticulitis. 3. Nonobstructing less than 5 mm left renal calculi. 4.  Splenomegaly. 5. Small fat containing umbilical hernia.  No bowel herniation. 6.  Aortic Atherosclerosis (ICD10-I70.0). Electronically Signed   By: Randa Ngo M.D.   On: 04/04/2021 17:50   US Venous Img Lower Bilateral (DVT)  Result Date: 04/06/2021 CLINICAL DATA:  75 year old male with history of pulmonary embolism. EXAM: BILATERAL LOWER EXTREMITY VENOUS DOPPLER ULTRASOUND TECHNIQUE: Gray-scale sonography with graded compression, as well as color Doppler and duplex ultrasound were performed to evaluate the lower extremity deep venous systems from the level of the common femoral vein and including the common femoral, femoral, profunda femoral, popliteal and calf veins including the posterior tibial, peroneal and gastrocnemius veins when visible. The superficial great saphenous vein was also interrogated. Spectral Doppler was utilized to evaluate flow at rest and with distal augmentation maneuvers in the common femoral, femoral and popliteal veins. COMPARISON:  None. FINDINGS: RIGHT LOWER EXTREMITY Common Femoral Vein: No evidence of thrombus. Normal compressibility, respiratory phasicity and response to augmentation. Saphenofemoral Junction: No evidence of thrombus. Normal compressibility and flow on color Doppler imaging. Profunda Femoral Vein: No evidence of thrombus. Normal compressibility and flow on color Doppler imaging. Femoral Vein: No evidence of thrombus. Normal compressibility, respiratory phasicity and response to augmentation. Popliteal Vein: No evidence of thrombus. Normal compressibility, respiratory phasicity and response to augmentation. Calf Veins: No evidence of thrombus. Normal compressibility and flow on color Doppler imaging. Superficial Great Saphenous Vein: Occlusive thrombus throughout the thigh and the calf to approximately 2.7 cm peripheral to the saphenofemoral junction. Other Findings:  None. LEFT LOWER EXTREMITY Common Femoral Vein: No evidence of thrombus. Normal  compressibility, respiratory phasicity and response to augmentation. Saphenofemoral Junction: No evidence of thrombus. Normal compressibility and flow on color Doppler imaging. Profunda Femoral Vein: No evidence of thrombus. Normal compressibility and flow on color Doppler imaging. Femoral Vein: No evidence of thrombus. Normal compressibility, respiratory phasicity and response to augmentation. Popliteal Vein: No evidence of thrombus. Normal compressibility, respiratory phasicity and response to augmentation. Calf Veins: No evidence of thrombus. Normal compressibility and flow on color Doppler imaging. Superficial Great Saphenous Vein: No evidence of thrombus. Normal compressibility. Other Findings:  None. IMPRESSION: 1. Superficial venous thrombosis of the right greater saphenous vein throughout the calf and thigh. No evidence of suppurative thrombophlebitis. 2. No evidence of bilateral lower extremity deep vein thrombosis. Ruthann Cancer, MD [facility] Electronically Signed   By:  Ruthann Cancer M.D.   On: 04/06/2021 09:15   US ARTERIAL ABI (SCREENING LOWER EXTREMITY)  Result Date: 04/05/2021 CLINICAL DATA:  Decreased pedal pulses. EXAM: NONINVASIVE PHYSIOLOGIC VASCULAR STUDY OF BILATERAL LOWER EXTREMITIES TECHNIQUE: Evaluation of both lower extremities were performed at rest, including calculation of ankle-brachial indices with single level Doppler, pressure and pulse volume recording. COMPARISON:  None. FINDINGS: Right ABI:  1.15 Left ABI:  1.26 Right Lower Extremity:  Irregular waveforms. Left Lower Extremity:  Normal arterial waveforms at the ankle. 1.0-1.4 Normal IMPRESSION: Normal resting ankle-brachial indices bilaterally. Electronically Signed   By: Markus Daft M.D.   On: 04/05/2021 08:44   CT Maxillofacial W Contrast  Result Date: 04/04/2021 CLINICAL DATA:  Bilateral TMJ with limited movement. Mouth pain. Possible malignancy. Fever and chills 3  weeks. EXAM: CT MAXILLOFACIAL WITH CONTRAST TECHNIQUE: Multidetector CT imaging of the maxillofacial structures was performed with intravenous contrast. Multiplanar CT image reconstructions were also generated. RADIATION DOSE REDUCTION: This exam was performed according to the departmental dose-optimization program which includes automated exposure control, adjustment of the mA and/or kV according to patient size and/or use of iterative reconstruction technique. CONTRAST:  138mL OMNIPAQUE IOHEXOL 300 MG/ML  SOLN COMPARISON:  None. FINDINGS: Osseous: Normal TMJ bilaterally. Normal alignment and no degenerative change. No lesion in the mandible. Mild periapical lucency around upper incisors bilaterally. Orbits: Negative for orbital mass or edema. Bilateral cataract extraction. Sinuses: Mucosal edema filling much of the left sphenoid sinus with bony thickening of the left sphenoid sinus. Remaining sinuses clear. Mastoid and middle ear clear bilaterally. Soft tissues: No soft tissue mass. No evidence of cellulitis or soft tissue abscess. Submandibular parotid glands normal bilaterally. Normal pharynx. Limited thyroid negative Limited intracranial: No acute abnormality. IMPRESSION: Normal TMJ bilaterally.  No dislocation or degenerative change. Small periapical lucency around upper incisors bilaterally compatible with dental infection. No evidence of soft tissue cellulitis or soft tissue abscess. No evidence of mass or malignancy Mucosal edema and bony thickening left sphenoid sinus. Electronically Signed   By: Franchot Gallo M.D.   On: 04/04/2021 17:59   US Abdomen Limited RUQ (LIVER/GB)  Result Date: 04/04/2021 CLINICAL DATA:  Elevated LFTs EXAM: ULTRASOUND ABDOMEN LIMITED RIGHT UPPER QUADRANT COMPARISON:  None. FINDINGS: Gallbladder: Moderate to large amount of echogenic sludge. No shadowing calculi visualized. No significant wall thickening or pericholecystic edema. Negative sonographic Murphy's sign. Common bile  duct: Diameter: 3 mm Liver: No focal lesion identified. Within normal limits in parenchymal echogenicity. Portal vein is patent on color Doppler imaging with normal direction of blood flow towards the liver. Other: None. IMPRESSION: Gallbladder sludge. Electronically Signed   By: Ofilia Neas M.D.   On: 04/04/2021 20:51    Assessment and plan-   #Thrombocytopenia, which is refractory to platelet transfusion, splenomegaly. Increased immature platelet fraction, indicating appropriate bone marrow response, possible peripheral blood destruction, secondary to splenomegaly and/or autoimmune or drug-induced ITP. Etiology of splenomegaly is unknown.  Peripheral flow cytometry is pending.   CRP is elevated.  ESR is elevated RPR negative adequate vitamin B12 and folate. ANA is negative.   Normal fibrinogen.  Normal haptoglobin Negative hepatitis and HIV.   Repeat smear showed no schistocytes.   EBV IgM is negative.  Positive IgG.  EBV PCR is pending. ID has ordered additional infectious work-up which is in process. I recommend to transfuse platelet if count is less than 10,000. He has 1 unit of platelet transfusion today and a post transfusion platelet count was 6000.  I recommend  to transfuse another unit of platelet today.  Monitor CBC closely.  Finish 4-day course of dexamethasone 40 mg daily.  Day 2 today.    #Leukopenia, predominantly neutropenia unknown baseline.  WBC 1.4 ANC 0.3  Possible due to acute infection or due to underlying malignancy.  Work-up in progress. Patient is on broad-spectrum antibiotics   #Elevated LFT, negative hepatitis panel. Ultrasound abdomen showed sludge gallbladder.  Parenchymal echogenicity within normal limits.  Patent portal vein.   #Pulmonary embolism, Superficial venous thrombosis of right greater saphenous vein Xarelto calf and thigh.  No DVT bilaterally. anticoagulation is contraindicated due to severe thrombocytopenia. Check antiphospholipid  panel.   #Rectal wall thickening/rectal abscess.-Surgery evaluation.  Status post drainage.  On piperacillin. #Hiccups, continue Flexeril.   Thank you for allowing me to participate in the care of this patient.  Plan was discussed with Dr. Si Raider and Dr. Delaine Lame.   Earlie Server, MD, PhD Hematology Oncology  04/06/2021

## 2021-04-06 NOTE — Care Management Important Message (Signed)
Important Message  Patient Details  Name: Eric Joyce MRN: [id number] Date of Birth: 1945-03-27   Medicare Important Message Given:  N/A - LOS <3 / Initial given by admissions     Dannette Barbara 04/06/2021, 1:46 PM

## 2021-04-06 NOTE — Progress Notes (Signed)
PROGRESS NOTE    Eric Joyce  YCX:[id number] DOB: 1945-12-12 DOA: 04/04/2021 PCP: [place]  Outpatient Specialists: cardiology    Brief Narrative:  From admission h and p Eric Joyce is a 76 y.o. male with medical history significant of Htn CAD sent by pcp for abnormal labs. Pt has been sick for 3 weeks and hpi as below.  Pt is alert, awake  and reports. He felt bad  x 3 weeks : Lack of energy, fever, chills, sweats Dr. Tamala Bari pcp. Pcp ordered labs and calls him to go hospital.  Symptoms: Pt went to [place] in [place] and back on jan 1st. [place] trip 1 week.  Jan 10th started to feel he was having a  bad cold : aches / fever/ headaches . On the 13th he felt worse : Fever / Chills / sweats /no rash or redness  so he went to Friday he took covid test and was negative. Fever would get worse in evening.  Sat / Sunday better and Monday all the symptoms come back.  Then that wednesday he went to walk in clinic [facility] in [place]. Gave him amoxicillin - took amoxicillin for two weeks but got worse. He got diarrhea - immediately after day 2 of amox. He  took amoxicillin for two weeks even with diarrhea.  stool was black,  Diarrhea was 6 times a day, pt did take pepcid complete and pepto bismal no imodium . Today he saw blood in urine.  The last time he had black stools was yesterday. Pt has never had blood transfusion.  No h/o covid.  SOB started on jan 10th.  No chest pain.   Assessment & Plan:   Principal Problem:   Generalized weakness Active Problems:   CAD (coronary artery disease)   Diabetes mellitus type 2, uncomplicated (HCC)   Alcoholism (HCC)   Other neutropenia (HCC)   Thrombocytopenia (HCC)   Abscess of anal and rectal regions   Fever and chills   Diarrhea   Hypophosphatemia   Acute pulmonary embolism (HCC)   Lactic acidosis   SOB (shortness of breath)   Melena  # Thrombocytopenia # Leukopenia # Splenomegaly Severe. In setting of  3 weeks constitutional symptoms, diarrhea, ibuprofen use. Splenomegaly on CT. No signs cirrhosis on imaging. Renal function appears normal. Immature platelet fraction elevated indicating at least partial bone marrow response. With splenomegaly and lack of response to platelet transfusion peripheral destruction appears to be present. No clear infectious etiology. Heme and ID are involved. This may be some form of ITP. Hepatitis panel neg. HIV neg. C diff neg. Plts remain below 10k - treating with steroids, if responds should see that in the next 1-2 days - cont zosyn pending gi pathogen panel results - transfuse 1 unit platelets today, repeat CBC this afternoon - f/u EBV - f/u lyme - f/u ana - f/u GI pathogen panel - f/u CMV - f/u RPR - f/u varicella - f/u erlichia - f/u bartonella - f/u rickettsia  # Perirectal abscess Likely 2/2 recent diarrhea. S/p I and D by gen surg 2/2. Symptomatically improved after that - gen surg following - f/u culture - cont zosyn  # Jaw pain CT of face unremarkable. Improving  # Pulmonary embolus Pro-thrombotic state? Underlying malignancy. Anticoagulation contraindicated given thrombocytopenia - check PVL of lower extremities  # Hiccups # GERD Possible GI pathology? - start ppi - trial flexeril - consider GI eval once platelets improve to safe number  # CAD S/p multiple  stents. Asymptomatic - cont home metop - hold statin given mild liver dysfunction here - hold aspirin  # HTN Here bnp wnl - holding lisinopril  # T2DM Glucose elevated in setting of steroids - SSI; hold home metformin - start semglee 10 qd   DVT prophylaxis: SCDs Code Status: full Family Communication: none @ bedside  Level of care: Progressive Status is: Inpatient Remains inpatient appropriate because: severity of illnes     Consultants:  ID, heme  Procedures: none  Antimicrobials:  zosyn    Subjective: Diarrhea improved. Feels weak. Complains of  hiccups and jaw pain, both improving. Epigastric abd pain today  Objective: Vitals:   04/05/21 2005 04/06/21 0023 04/06/21 0500 04/06/21 0938  BP: 120/79 108/76 116/85 101/76  Pulse: 92 (!) 103 74 85  Resp: 18 19 18 16   Temp: 99.1 F (37.3 C) 98.4 F (36.9 C) 98.6 F (37 C) 97.7 F (36.5 C)  TempSrc:  Oral Oral Oral  SpO2: 96% 94% 96% 97%  Weight:      Height:        Intake/Output Summary (Last 24 hours) at 04/06/2021 1046 Last data filed at 04/06/2021 0600 Gross per 24 hour  Intake 1636.96 ml  Output 2300 ml  Net -663.04 ml   Filed Weights   04/05/21 0223  Weight: 98.3 kg    Examination:  General exam: Appears calm and comfortable  Respiratory system: Clear to auscultation. Respiratory effort normal. Cardiovascular system: S1 & S2 heard, RRR. No JVD, murmurs, rubs, gallops or clicks. No pedal edema. Gastrointestinal system: Abdomen is obese, soft and nontender. No organomegaly or masses felt. Normal bowel sounds heard. Central nervous system: Alert and oriented. No focal neurological deficits. Extremities: Symmetric 5 x 5 power. Skin: No rashes, lesions or ulcers. No bruising Psychiatry: Judgement and insight appear normal. Mood & affect appropriate.     Data Reviewed: I have personally reviewed following labs and imaging studies  CBC: Recent Labs  Lab 04/05/21 0008 04/05/21 0237 04/05/21 0936 04/05/21 1350 04/06/21 0530  WBC 1.3* 1.3* 1.3* 1.2* 1.1*  NEUTROABS 0.2* 0.2* 0.2* 0.3* 0.3*  HGB 13.0 12.3* 12.3* 12.9* 12.2*  HCT 37.7* 35.8* 35.6* 37.0* 35.0*  MCV 88.9 88.0 87.9 87.5 86.6  PLT 7* <5* 5* <5* 5*   Basic Metabolic Panel: Recent Labs  Lab 04/04/21 1505 04/04/21 1843 04/06/21 0530  NA 131*  --  130*  K 3.7  --  4.0  CL 97*  --  100  CO2 24  --  22  GLUCOSE 202*  --  255*  BUN 22  --  19  CREATININE 0.73  --  0.64  CALCIUM 8.8*  --  7.9*  MG  --  1.7  --   PHOS  --  2.1*  --    GFR: Estimated Creatinine Clearance: 100 mL/min (by C-G  formula based on SCr of 0.64 mg/dL). Liver Function Tests: Recent Labs  Lab 04/04/21 1603 04/05/21 1350  AST 128* 108*  ALT 174* 150*  ALKPHOS 133* 131*  BILITOT 2.1* 2.1*  PROT 5.9* 6.2*  ALBUMIN 2.4* 2.5*   Recent Labs  Lab 04/04/21 1843  LIPASE 30   Recent Labs  Lab 04/05/21 0237  AMMONIA 30   Coagulation Profile: No results for input(s): INR, PROTIME in the last 168 hours. Cardiac Enzymes: Recent Labs  Lab 04/05/21 0237  CKTOTAL 20*   BNP (last 3 results) No results for input(s): PROBNP in the last 8760 hours. HbA1C: Recent Labs  04/05/21 0008  HGBA1C 6.6*   CBG: Recent Labs  Lab 04/05/21 0917 04/05/21 1624 04/05/21 2036 04/06/21 0941  GLUCAP 159* 166* 204* 241*   Lipid Profile: No results for input(s): CHOL, HDL, LDLCALC, TRIG, CHOLHDL, LDLDIRECT in the last 72 hours. Thyroid Function Tests: Recent Labs    04/04/21 1843  TSH 0.952  FREET4 1.29*   Anemia Panel: Recent Labs    04/04/21 1843  VITAMINB12 >7,500*  FOLATE 17.4  RETICCTPCT 2.0   Urine analysis:    Component Value Date/Time   COLORURINE AMBER (A) 04/05/2021 0955   APPEARANCEUR CLEAR 04/05/2021 0955   APPEARANCEUR Clear 02/15/2021 0939   LABSPEC 1.015 04/05/2021 0955   LABSPEC 1.017 04/27/2012 0853   PHURINE 5.5 04/05/2021 0955   GLUCOSEU NEGATIVE 04/05/2021 0955   GLUCOSEU Negative 04/27/2012 0853   HGBUR MODERATE (A) 04/05/2021 0955   BILIRUBINUR SMALL (A) 04/05/2021 0955   BILIRUBINUR Negative 02/15/2021 0939   BILIRUBINUR Negative 04/27/2012 0853   KETONESUR TRACE (A) 04/05/2021 0955   PROTEINUR 30 (A) 04/05/2021 0955   NITRITE NEGATIVE 04/05/2021 0955   LEUKOCYTESUR NEGATIVE 04/05/2021 0955   LEUKOCYTESUR Negative 04/27/2012 0853   Sepsis Labs: @LABRCNTIP (procalcitonin:4,lacticidven:4)  ) Recent Results (from the past 240 hour(s))  Resp Panel by RT-PCR (Flu A&B, Covid) Nasopharyngeal Swab     Status: None   Collection Time: 04/04/21  4:03 PM   Specimen:  Nasopharyngeal Swab; Nasopharyngeal(NP) swabs in vial transport medium  Result Value Ref Range Status   SARS Coronavirus 2 by RT PCR NEGATIVE NEGATIVE Final    Comment: (NOTE) SARS-CoV-2 target nucleic acids are NOT DETECTED.  The SARS-CoV-2 RNA is generally detectable in upper respiratory specimens during the acute phase of infection. The lowest concentration of SARS-CoV-2 viral copies this assay can detect is 138 copies/mL. A negative result does not preclude SARS-Cov-2 infection and should not be used as the sole basis for treatment or other patient management decisions. A negative result may occur with  improper specimen collection/handling, submission of specimen other than nasopharyngeal swab, presence of viral mutation(s) within the areas targeted by this assay, and inadequate number of viral copies(<138 copies/mL). A negative result must be combined with clinical observations, patient history, and epidemiological information. The expected result is Negative.  Fact Sheet for Patients:  EntrepreneurPulse.com.au  Fact Sheet for Healthcare Providers:  IncredibleEmployment.be  This test is no t yet approved or cleared by the [place] FDA and  has been authorized for detection and/or diagnosis of SARS-CoV-2 by FDA under an Emergency Use Authorization (EUA). This EUA will remain  in effect (meaning this test can be used) for the duration of the COVID-19 declaration under Section 564(b)(1) of the Act, 21 U.S.C.section 360bbb-3(b)(1), unless the authorization is terminated  or revoked sooner.       Influenza A by PCR NEGATIVE NEGATIVE Final   Influenza B by PCR NEGATIVE NEGATIVE Final    Comment: (NOTE) The Xpert Xpress SARS-CoV-2/FLU/RSV plus assay is intended as an aid in the diagnosis of influenza from Nasopharyngeal swab specimens and should not be used as a sole basis for treatment. Nasal washings and aspirates are unacceptable for  Xpert Xpress SARS-CoV-2/FLU/RSV testing.  Fact Sheet for Patients: EntrepreneurPulse.com.au  Fact Sheet for Healthcare Providers: IncredibleEmployment.be  This test is not yet approved or cleared by the [place] FDA and has been authorized for detection and/or diagnosis of SARS-CoV-2 by FDA under an Emergency Use Authorization (EUA). This EUA will remain in effect (meaning this test can be used)  for the duration of the COVID-19 declaration under Section 564(b)(1) of the Act, 21 U.S.C. section 360bbb-3(b)(1), unless the authorization is terminated or revoked.  Performed at [facility], [address] [place], [place] [address]   Blood culture (routine x 2)     Status: None (Preliminary result)   Collection Time: 04/04/21  6:43 PM   Specimen: BLOOD  Result Value Ref Range Status   Specimen Description BLOOD RAC  Final   Special Requests BOTTLES DRAWN AEROBIC AND ANAEROBIC BCAV  Final   Culture   Final    NO GROWTH 2 DAYS Performed at [facility], [address], [place], [place] [address]    Report Status PENDING  Incomplete  Blood culture (routine x 2)     Status: None (Preliminary result)   Collection Time: 04/04/21  6:43 PM   Specimen: BLOOD  Result Value Ref Range Status   Specimen Description BLOOD BLRA  Final   Special Requests BOTTLES DRAWN AEROBIC AND ANAEROBIC [place]  Final   Culture   Final    NO GROWTH 2 DAYS Performed at [facility] [address] [place], [place] [address]    Report Status PENDING  Incomplete  C Difficile Quick Screen w PCR reflex     Status: None   Collection Time: 04/05/21  8:35 AM   Specimen: STOOL  Result Value Ref Range Status   C Diff antigen NEGATIVE NEGATIVE Final   C Diff toxin NEGATIVE NEGATIVE Final   C Diff interpretation No C. difficile detected.  Final    Comment: Performed at [facility], [address] [place], [place] [address]   Aerobic/Anaerobic Culture w Gram Stain (surgical/deep wound)     Status: None (Preliminary result)   Collection Time: 04/05/21  5:33 PM   Specimen: Wound  Result Value Ref Range Status   Specimen Description   Final    WOUND Performed at [facility], [address] [place], [place] [address]    Special Requests   Final    [facility] ABSCESS Performed at [facility], [address] [place], [place] [address]    Gram Stain   Final    FEW WBC PRESENT, PREDOMINANTLY MONONUCLEAR ABUNDANT GRAM NEGATIVE RODS FEW GRAM POSITIVE COCCI FEW GRAM POSITIVE RODS Performed at [facility], [place] [address], [place], [place] [address]    Culture FEW GRAM NEGATIVE RODS  Final   Report Status PENDING  Incomplete         Radiology Studies: DG Chest 2 View  Result Date: 04/04/2021 CLINICAL DATA:  Generalized weakness, fever and chills for more than 3 weeks. Black stool and copious diarrhea. EXAM: CHEST - 2 VIEW COMPARISON:  None. FINDINGS: Normal sized heart. Tortuous aorta. Clear lungs. Thoracic spine degenerative changes. IMPRESSION: No acute abnormality. Electronically Signed   By: Claudie Revering M.D.   On: 04/04/2021 17:06   CT Angio Chest Pulmonary Embolism (PE) W or WO Contrast  Result Date: 04/04/2021 CLINICAL DATA:  Weakness and fever for several weeks, initial encounter EXAM: CT ANGIOGRAPHY CHEST WITH CONTRAST TECHNIQUE: Multidetector CT imaging of the chest was performed using the standard protocol during bolus administration of intravenous contrast. Multiplanar CT image reconstructions and MIPs were obtained to evaluate the vascular anatomy. RADIATION DOSE REDUCTION: This exam was performed according to the departmental dose-optimization program which includes automated exposure control, adjustment of the mA and/or kV according to patient size and/or use of iterative reconstruction technique. CONTRAST:  57mL  OMNIPAQUE IOHEXOL 350 MG/ML SOLN COMPARISON:  None.  FINDINGS: Cardiovascular: Thoracic aorta shows a normal branching pattern. No aneurysmal dilatation or dissection is noted. No cardiac enlargement is seen. Coronary calcifications are noted. The pulmonary artery shows a normal branching pattern bilaterally. Mild right lower lobe pulmonary emboli are seen posteriorly. No right heart strain is noted. Mediastinum/Nodes: Thoracic inlet is within normal limits. No sizable hilar or mediastinal adenopathy is noted. The esophagus is within normal limits. Lungs/Pleura: Lungs demonstrate mild dependent atelectatic changes. No sizable parenchymal nodules are noted. Upper Abdomen: Visualized upper abdomen is within normal limits. Musculoskeletal: Degenerative changes of the thoracic spine are noted. No acute rib abnormality is noted. Review of the MIP images confirms the above findings. IMPRESSION: Changes consistent with right lower lobe pulmonary emboli. No right heart strain is noted. Mild dependent atelectatic changes. Critical Value/emergent results were communicated the Amion at the time of interpretation on 04/04/2021 at 10:28 Pm to Dr. Florina Ou , who verbally acknowledged these results. Electronically Signed   By: Inez Catalina M.D.   On: 04/04/2021 22:32   CT ABDOMEN PELVIS W CONTRAST  Result Date: 04/04/2021 CLINICAL DATA:  Weakness, fever, chills for 3 weeks, dark stool, diarrhea EXAM: CT ABDOMEN AND PELVIS WITH CONTRAST TECHNIQUE: Multidetector CT imaging of the abdomen and pelvis was performed using the standard protocol following bolus administration of intravenous contrast. RADIATION DOSE REDUCTION: This exam was performed according to the departmental dose-optimization program which includes automated exposure control, adjustment of the mA and/or kV according to patient size and/or use of iterative reconstruction technique. CONTRAST:  154mL OMNIPAQUE IOHEXOL 300 MG/ML  SOLN COMPARISON:  None. FINDINGS: Lower chest: No acute pleural or parenchymal lung disease.  Hepatobiliary: No focal liver abnormality is seen. No gallstones, gallbladder wall thickening, or biliary dilatation. Pancreas: Unremarkable. No pancreatic ductal dilatation or surrounding inflammatory changes. Spleen: The spleen is enlarged measuring 15.5 x 15.0 x 5.7 cm. No focal parenchymal abnormalities. Adrenals/Urinary Tract: There are 2 sub 5 mm nonobstructing calculi within the lower pole left kidney. No right-sided calculi. Small bilateral renal cortical cysts. No obstructive uropathy. Bladder is unremarkable. The adrenals are normal. Stomach/Bowel: No bowel obstruction or ileus. Scattered diverticulosis of the sigmoid colon without evidence of diverticulitis. Mild circumferential rectal wall thickening. There is a 1.7 x 1.2 cm low-attenuation region dorsal to the anal verge reference image 100/3, which could reflect a small perirectal abscess. Please correlate with physical exam findings. No inflammatory changes. Vascular/Lymphatic: Aortic atherosclerosis. No enlarged abdominal or pelvic lymph nodes. Numerous subcentimeter mesenteric and retroperitoneal lymph nodes are nonspecific. Reproductive: The prostate is mildly enlarged. Surgical clips are seen within the ventral aspect of the prostate. Other: No free fluid or free intraperitoneal gas. There is a small fat containing umbilical hernia. No bowel herniation. Musculoskeletal: No acute or destructive bony lesions. Reconstructed images demonstrate no additional findings. IMPRESSION: 1. Mild nonspecific circumferential rectal wall thickening, with possible small perirectal abscess dorsal to the anal verge. Please correlate with physical exam findings. 2. Distal colonic diverticulosis without diverticulitis. 3. Nonobstructing less than 5 mm left renal calculi. 4. Splenomegaly. 5. Small fat containing umbilical hernia.  No bowel herniation. 6.  Aortic Atherosclerosis (ICD10-I70.0). Electronically Signed   By: Randa Ngo M.D.   On: 04/04/2021 17:50   US  Venous Img Lower Bilateral (DVT)  Result Date: 04/06/2021 CLINICAL DATA:  76 year old male with history of pulmonary embolism. EXAM: BILATERAL LOWER EXTREMITY VENOUS DOPPLER ULTRASOUND TECHNIQUE: Gray-scale sonography with graded compression, as well as color  Doppler and duplex ultrasound were performed to evaluate the lower extremity deep venous systems from the level of the common femoral vein and including the common femoral, femoral, profunda femoral, popliteal and calf veins including the posterior tibial, peroneal and gastrocnemius veins when visible. The superficial great saphenous vein was also interrogated. Spectral Doppler was utilized to evaluate flow at rest and with distal augmentation maneuvers in the common femoral, femoral and popliteal veins. COMPARISON:  None. FINDINGS: RIGHT LOWER EXTREMITY Common Femoral Vein: No evidence of thrombus. Normal compressibility, respiratory phasicity and response to augmentation. Saphenofemoral Junction: No evidence of thrombus. Normal compressibility and flow on color Doppler imaging. Profunda Femoral Vein: No evidence of thrombus. Normal compressibility and flow on color Doppler imaging. Femoral Vein: No evidence of thrombus. Normal compressibility, respiratory phasicity and response to augmentation. Popliteal Vein: No evidence of thrombus. Normal compressibility, respiratory phasicity and response to augmentation. Calf Veins: No evidence of thrombus. Normal compressibility and flow on color Doppler imaging. Superficial Great Saphenous Vein: Occlusive thrombus throughout the thigh and the calf to approximately 2.7 cm peripheral to the saphenofemoral junction. Other Findings:  None. LEFT LOWER EXTREMITY Common Femoral Vein: No evidence of thrombus. Normal compressibility, respiratory phasicity and response to augmentation. Saphenofemoral Junction: No evidence of thrombus. Normal compressibility and flow on color Doppler imaging. Profunda Femoral Vein: No evidence  of thrombus. Normal compressibility and flow on color Doppler imaging. Femoral Vein: No evidence of thrombus. Normal compressibility, respiratory phasicity and response to augmentation. Popliteal Vein: No evidence of thrombus. Normal compressibility, respiratory phasicity and response to augmentation. Calf Veins: No evidence of thrombus. Normal compressibility and flow on color Doppler imaging. Superficial Great Saphenous Vein: No evidence of thrombus. Normal compressibility. Other Findings:  None. IMPRESSION: 1. Superficial venous thrombosis of the right greater saphenous vein throughout the calf and thigh. No evidence of suppurative thrombophlebitis. 2. No evidence of bilateral lower extremity deep vein thrombosis. Ruthann Cancer, MD [facility] Radiology Electronically Signed   By: Ruthann Cancer M.D.   On: 04/06/2021 09:15   US ARTERIAL ABI (SCREENING LOWER EXTREMITY)  Result Date: 04/05/2021 CLINICAL DATA:  Decreased pedal pulses. EXAM: NONINVASIVE PHYSIOLOGIC VASCULAR STUDY OF BILATERAL LOWER EXTREMITIES TECHNIQUE: Evaluation of both lower extremities were performed at rest, including calculation of ankle-brachial indices with single level Doppler, pressure and pulse volume recording. COMPARISON:  None. FINDINGS: Right ABI:  1.15 Left ABI:  1.26 Right Lower Extremity:  Irregular waveforms. Left Lower Extremity:  Normal arterial waveforms at the ankle. 1.0-1.4 Normal IMPRESSION: Normal resting ankle-brachial indices bilaterally. Electronically Signed   By: Markus Daft M.D.   On: 04/05/2021 08:44   CT Maxillofacial W Contrast  Result Date: 04/04/2021 CLINICAL DATA:  Bilateral TMJ with limited movement. Mouth pain. Possible malignancy. Fever and chills 3 weeks. EXAM: CT MAXILLOFACIAL WITH CONTRAST TECHNIQUE: Multidetector CT imaging of the maxillofacial structures was performed with intravenous contrast. Multiplanar CT image reconstructions were also generated.  RADIATION DOSE REDUCTION: This exam was performed according to the departmental dose-optimization program which includes automated exposure control, adjustment of the mA and/or kV according to patient size and/or use of iterative reconstruction technique. CONTRAST:  136mL OMNIPAQUE IOHEXOL 300 MG/ML  SOLN COMPARISON:  None. FINDINGS: Osseous: Normal TMJ bilaterally. Normal alignment and no degenerative change. No lesion in the mandible. Mild periapical lucency around upper incisors bilaterally. Orbits: Negative for orbital mass or edema. Bilateral cataract extraction. Sinuses: Mucosal edema filling much of the left sphenoid sinus with bony thickening of the left  sphenoid sinus. Remaining sinuses clear. Mastoid and middle ear clear bilaterally. Soft tissues: No soft tissue mass. No evidence of cellulitis or soft tissue abscess. Submandibular parotid glands normal bilaterally. Normal pharynx. Limited thyroid negative Limited intracranial: No acute abnormality. IMPRESSION: Normal TMJ bilaterally.  No dislocation or degenerative change. Small periapical lucency around upper incisors bilaterally compatible with dental infection. No evidence of soft tissue cellulitis or soft tissue abscess. No evidence of mass or malignancy Mucosal edema and bony thickening left sphenoid sinus. Electronically Signed   By: Franchot Gallo M.D.   On: 04/04/2021 17:59   US Abdomen Limited RUQ (LIVER/GB)  Result Date: 04/04/2021 CLINICAL DATA:  Elevated LFTs EXAM: ULTRASOUND ABDOMEN LIMITED RIGHT UPPER QUADRANT COMPARISON:  None. FINDINGS: Gallbladder: Moderate to large amount of echogenic sludge. No shadowing calculi visualized. No significant wall thickening or pericholecystic edema. Negative sonographic Murphy's sign. Common bile duct: Diameter: 3 mm Liver: No focal lesion identified. Within normal limits in parenchymal echogenicity. Portal vein is patent on color Doppler imaging with normal direction of blood flow towards the liver.  Other: None. IMPRESSION: Gallbladder sludge. Electronically Signed   By: Ofilia Neas M.D.   On: 04/04/2021 20:51        Scheduled Meds:  sodium chloride   Intravenous Once   sodium chloride   Intravenous Once   dexamethasone  40 mg Intravenous Daily   gabapentin  400 mg Oral TID   insulin aspart  0-9 Units Subcutaneous TID WC   metoprolol succinate  50 mg Oral Daily   multivitamin with minerals  1 tablet Oral Daily   Ensure Max Protein  11 oz Oral BID   Continuous Infusions:  sodium chloride 75 mL/hr at 04/06/21 0508   piperacillin-tazobactam (ZOSYN)  IV 3.375 g (04/06/21 0504)     LOS: 2 days    Time spent: 27 min    Desma Maxim, MD [facility]   If 7PM-7AM, please contact night-coverage [email or web address] Password [facility] 04/06/2021, 10:46 AM

## 2021-04-06 NOTE — Progress Notes (Signed)
Date of Admission:  04/04/2021      ID: Eric Joyce is a 76 y.o. male Principal Problem:   Generalized weakness Active Problems:   CAD (coronary artery disease)   Diabetes mellitus type 2, uncomplicated (HCC)   Alcoholism ([place])   Other neutropenia (HCC)   Thrombocytopenia (HCC)   Abscess of anal and rectal regions   Fever and chills   Diarrhea   Hypophosphatemia   Acute pulmonary embolism (HCC)   Lactic acidosis   SOB (shortness of breath)   Melena    Subjective: Continues to have hiccups but better than before Tired Poor appetite No diarrhea Jaw pain is much better Is able to open his mouth No documented fever since hospitalization Underwent I/D of the small anal abscess at bedside yesterday.  Medications:   sodium chloride   Intravenous Once   dexamethasone  40 mg Intravenous Daily   gabapentin  400 mg Oral TID   insulin aspart  0-20 Units Subcutaneous TID WC   insulin aspart  0-5 Units Subcutaneous QHS   insulin glargine-yfgn  10 Units Subcutaneous Daily   metoprolol succinate  50 mg Oral Daily   multivitamin with minerals  1 tablet Oral Daily   pantoprazole  20 mg Oral Daily   Ensure Max Protein  11 oz Oral BID    Objective: Vital signs in last 24 hours: Patient Vitals for the past 24 hrs:  BP Temp Temp src Pulse Resp SpO2  04/06/21 1201 132/87 98.3 F (36.8 C) Oral 87 20 98 %  04/06/21 1146 133/81 98.3 F (36.8 C) -- 82 20 96 %  04/06/21 1129 133/81 98.3 F (36.8 C) -- 82 20 96 %  04/06/21 0938 101/76 97.7 F (36.5 C) Oral 85 16 97 %  04/06/21 0500 116/85 98.6 F (37 C) Oral 74 18 96 %  04/06/21 0023 108/76 98.4 F (36.9 C) Oral (!) 103 19 94 %  04/05/21 2005 120/79 99.1 F (37.3 C) -- 92 18 96 %    PHYSICAL EXAM:  General: Alert, cooperative, ill   Head: Normocephalic, without obvious abnormality, atraumatic. Eyes: Conjunctivae clear, anicteric sclerae. Pupils are equal ENT Nares normal. No drainage or sinus tenderness. Lips, mucosa,  and tongue normal. No Thrush Neck: Supple, symmetrical, no adenopathy, thyroid: non tender no carotid bruit and no JVD. Back: No CVA tenderness. Lungs: Clear to auscultation bilaterally. No Wheezing or Rhonchi. No rales. Heart: Regular rate and rhythm, no murmur, rub or gallop. Abdomen: Soft, non-tender,not distended. Bowel sounds normal. No masses Extremities: atraumatic, no cyanosis. No edema. No clubbing Skin: No rashes or lesions. Or bruising Lymph: Cervical, supraclavicular normal. Neurologic: Grossly non-focal  Lab Results Recent Labs    04/04/21 1505 04/05/21 0008 04/05/21 1350 04/06/21 0530  WBC 1.2*   < > 1.2* 1.1*  HGB 13.7   < > 12.9* 12.2*  HCT 39.3   < > 37.0* 35.0*  NA 131*  --   --  130*  K 3.7  --   --  4.0  CL 97*  --   --  100  CO2 24  --   --  22  BUN 22  --   --  19  CREATININE 0.73  --   --  0.64   < > = values in this interval not displayed.   Liver Panel Recent Labs    04/04/21 1603 04/05/21 1350  PROT 5.9* 6.2*  ALBUMIN 2.4* 2.5*  AST 128* 108*  ALT 174* 150*  ALKPHOS 133* 131*  BILITOT 2.1* 2.1*  BILIDIR 0.9* 0.9*  IBILI 1.2* 1.2*   Sedimentation Rate Recent Labs    04/05/21 1758  ESRSEDRATE 21*   C-Reactive Protein Recent Labs    04/05/21 0237  CRP 21.9*    Microbiology: Blood cultures sent on 04/04/2021 negative so far Abscess cultures so far is Proteus Studies/Results: DG Chest 2 View  Result Date: 04/04/2021 CLINICAL DATA:  Generalized weakness, fever and chills for more than 3 weeks. Black stool and copious diarrhea. EXAM: CHEST - 2 VIEW COMPARISON:  None. FINDINGS: Normal sized heart. Tortuous aorta. Clear lungs. Thoracic spine degenerative changes. IMPRESSION: No acute abnormality. Electronically Signed   By: Claudie Revering M.D.   On: 04/04/2021 17:06   CT Angio Chest Pulmonary Embolism (PE) W or WO Contrast  Result Date: 04/04/2021 CLINICAL DATA:  Weakness and fever for several weeks, initial encounter EXAM: CT ANGIOGRAPHY  CHEST WITH CONTRAST TECHNIQUE: Multidetector CT imaging of the chest was performed using the standard protocol during bolus administration of intravenous contrast. Multiplanar CT image reconstructions and MIPs were obtained to evaluate the vascular anatomy. RADIATION DOSE REDUCTION: This exam was performed according to the departmental dose-optimization program which includes automated exposure control, adjustment of the mA and/or kV according to patient size and/or use of iterative reconstruction technique. CONTRAST:  28mL OMNIPAQUE IOHEXOL 350 MG/ML SOLN COMPARISON:  None. FINDINGS: Cardiovascular: Thoracic aorta shows a normal branching pattern. No aneurysmal dilatation or dissection is noted. No cardiac enlargement is seen. Coronary calcifications are noted. The pulmonary artery shows a normal branching pattern bilaterally. Mild right lower lobe pulmonary emboli are seen posteriorly. No right heart strain is noted. Mediastinum/Nodes: Thoracic inlet is within normal limits. No sizable hilar or mediastinal adenopathy is noted. The esophagus is within normal limits. Lungs/Pleura: Lungs demonstrate mild dependent atelectatic changes. No sizable parenchymal nodules are noted. Upper Abdomen: Visualized upper abdomen is within normal limits. Musculoskeletal: Degenerative changes of the thoracic spine are noted. No acute rib abnormality is noted. Review of the MIP images confirms the above findings. IMPRESSION: Changes consistent with right lower lobe pulmonary emboli. No right heart strain is noted. Mild dependent atelectatic changes. Critical Value/emergent results were communicated the [facility] at the time of interpretation on 04/04/2021 at 10:28 Pm to Dr. Florina Ou , who verbally acknowledged these results. Electronically Signed   By: Inez Catalina M.D.   On: 04/04/2021 22:32   CT ABDOMEN PELVIS W CONTRAST  Result Date: 04/04/2021 CLINICAL DATA:  Weakness, fever, chills for 3 weeks, dark stool, diarrhea EXAM: CT  ABDOMEN AND PELVIS WITH CONTRAST TECHNIQUE: Multidetector CT imaging of the abdomen and pelvis was performed using the standard protocol following bolus administration of intravenous contrast. RADIATION DOSE REDUCTION: This exam was performed according to the departmental dose-optimization program which includes automated exposure control, adjustment of the mA and/or kV according to patient size and/or use of iterative reconstruction technique. CONTRAST:  180mL OMNIPAQUE IOHEXOL 300 MG/ML  SOLN COMPARISON:  None. FINDINGS: Lower chest: No acute pleural or parenchymal lung disease. Hepatobiliary: No focal liver abnormality is seen. No gallstones, gallbladder wall thickening, or biliary dilatation. Pancreas: Unremarkable. No pancreatic ductal dilatation or surrounding inflammatory changes. Spleen: The spleen is enlarged measuring 15.5 x 15.0 x 5.7 cm. No focal parenchymal abnormalities. Adrenals/Urinary Tract: There are 2 sub 5 mm nonobstructing calculi within the lower pole left kidney. No right-sided calculi. Small bilateral renal cortical cysts. No obstructive uropathy. Bladder is unremarkable. The adrenals are normal. Stomach/Bowel: No bowel obstruction  or ileus. Scattered diverticulosis of the sigmoid colon without evidence of diverticulitis. Mild circumferential rectal wall thickening. There is a 1.7 x 1.2 cm low-attenuation region dorsal to the anal verge reference image 100/3, which could reflect a small perirectal abscess. Please correlate with physical exam findings. No inflammatory changes. Vascular/Lymphatic: Aortic atherosclerosis. No enlarged abdominal or pelvic lymph nodes. Numerous subcentimeter mesenteric and retroperitoneal lymph nodes are nonspecific. Reproductive: The prostate is mildly enlarged. Surgical clips are seen within the ventral aspect of the prostate. Other: No free fluid or free intraperitoneal gas. There is a small fat containing umbilical hernia. No bowel herniation. Musculoskeletal:  No acute or destructive bony lesions. Reconstructed images demonstrate no additional findings. IMPRESSION: 1. Mild nonspecific circumferential rectal wall thickening, with possible small perirectal abscess dorsal to the anal verge. Please correlate with physical exam findings. 2. Distal colonic diverticulosis without diverticulitis. 3. Nonobstructing less than 5 mm left renal calculi. 4. Splenomegaly. 5. Small fat containing umbilical hernia.  No bowel herniation. 6.  Aortic Atherosclerosis (ICD10-I70.0). Electronically Signed   By: Randa Ngo M.D.   On: 04/04/2021 17:50   US Venous Img Lower Bilateral (DVT)  Result Date: 04/06/2021 CLINICAL DATA:  76 year old male with history of pulmonary embolism. EXAM: BILATERAL LOWER EXTREMITY VENOUS DOPPLER ULTRASOUND TECHNIQUE: Gray-scale sonography with graded compression, as well as color Doppler and duplex ultrasound were performed to evaluate the lower extremity deep venous systems from the level of the common femoral vein and including the common femoral, femoral, profunda femoral, popliteal and calf veins including the posterior tibial, peroneal and gastrocnemius veins when visible. The superficial great saphenous vein was also interrogated. Spectral Doppler was utilized to evaluate flow at rest and with distal augmentation maneuvers in the common femoral, femoral and popliteal veins. COMPARISON:  None. FINDINGS: RIGHT LOWER EXTREMITY Common Femoral Vein: No evidence of thrombus. Normal compressibility, respiratory phasicity and response to augmentation. Saphenofemoral Junction: No evidence of thrombus. Normal compressibility and flow on color Doppler imaging. Profunda Femoral Vein: No evidence of thrombus. Normal compressibility and flow on color Doppler imaging. Femoral Vein: No evidence of thrombus. Normal compressibility, respiratory phasicity and response to augmentation. Popliteal Vein: No evidence of thrombus. Normal compressibility, respiratory phasicity  and response to augmentation. Calf Veins: No evidence of thrombus. Normal compressibility and flow on color Doppler imaging. Superficial Great Saphenous Vein: Occlusive thrombus throughout the thigh and the calf to approximately 2.7 cm peripheral to the saphenofemoral junction. Other Findings:  None. LEFT LOWER EXTREMITY Common Femoral Vein: No evidence of thrombus. Normal compressibility, respiratory phasicity and response to augmentation. Saphenofemoral Junction: No evidence of thrombus. Normal compressibility and flow on color Doppler imaging. Profunda Femoral Vein: No evidence of thrombus. Normal compressibility and flow on color Doppler imaging. Femoral Vein: No evidence of thrombus. Normal compressibility, respiratory phasicity and response to augmentation. Popliteal Vein: No evidence of thrombus. Normal compressibility, respiratory phasicity and response to augmentation. Calf Veins: No evidence of thrombus. Normal compressibility and flow on color Doppler imaging. Superficial Great Saphenous Vein: No evidence of thrombus. Normal compressibility. Other Findings:  None. IMPRESSION: 1. Superficial venous thrombosis of the right greater saphenous vein throughout the calf and thigh. No evidence of suppurative thrombophlebitis. 2. No evidence of bilateral lower extremity deep vein thrombosis. Ruthann Cancer, MD [facility] Electronically Signed   By: Ruthann Cancer M.D.   On: 04/06/2021 09:15   US ARTERIAL ABI (SCREENING LOWER EXTREMITY)  Result Date: 04/05/2021 CLINICAL DATA:  Decreased pedal pulses. EXAM: NONINVASIVE PHYSIOLOGIC  VASCULAR STUDY OF BILATERAL LOWER EXTREMITIES TECHNIQUE: Evaluation of both lower extremities were performed at rest, including calculation of ankle-brachial indices with single level Doppler, pressure and pulse volume recording. COMPARISON:  None. FINDINGS: Right ABI:  1.15 Left ABI:  1.26 Right Lower Extremity:  Irregular  waveforms. Left Lower Extremity:  Normal arterial waveforms at the ankle. 1.0-1.4 Normal IMPRESSION: Normal resting ankle-brachial indices bilaterally. Electronically Signed   By: Markus Daft M.D.   On: 04/05/2021 08:44   CT Maxillofacial W Contrast  Result Date: 04/04/2021 CLINICAL DATA:  Bilateral TMJ with limited movement. Mouth pain. Possible malignancy. Fever and chills 3 weeks. EXAM: CT MAXILLOFACIAL WITH CONTRAST TECHNIQUE: Multidetector CT imaging of the maxillofacial structures was performed with intravenous contrast. Multiplanar CT image reconstructions were also generated. RADIATION DOSE REDUCTION: This exam was performed according to the departmental dose-optimization program which includes automated exposure control, adjustment of the mA and/or kV according to patient size and/or use of iterative reconstruction technique. CONTRAST:  133mL OMNIPAQUE IOHEXOL 300 MG/ML  SOLN COMPARISON:  None. FINDINGS: Osseous: Normal TMJ bilaterally. Normal alignment and no degenerative change. No lesion in the mandible. Mild periapical lucency around upper incisors bilaterally. Orbits: Negative for orbital mass or edema. Bilateral cataract extraction. Sinuses: Mucosal edema filling much of the left sphenoid sinus with bony thickening of the left sphenoid sinus. Remaining sinuses clear. Mastoid and middle ear clear bilaterally. Soft tissues: No soft tissue mass. No evidence of cellulitis or soft tissue abscess. Submandibular parotid glands normal bilaterally. Normal pharynx. Limited thyroid negative Limited intracranial: No acute abnormality. IMPRESSION: Normal TMJ bilaterally.  No dislocation or degenerative change. Small periapical lucency around upper incisors bilaterally compatible with dental infection. No evidence of soft tissue cellulitis or soft tissue abscess. No evidence of mass or malignancy Mucosal edema and bony thickening left sphenoid sinus. Electronically Signed   By: Franchot Gallo M.D.   On:  04/04/2021 17:59   US Abdomen Limited RUQ (LIVER/GB)  Result Date: 04/04/2021 CLINICAL DATA:  Elevated LFTs EXAM: ULTRASOUND ABDOMEN LIMITED RIGHT UPPER QUADRANT COMPARISON:  None. FINDINGS: Gallbladder: Moderate to large amount of echogenic sludge. No shadowing calculi visualized. No significant wall thickening or pericholecystic edema. Negative sonographic Murphy's sign. Common bile duct: Diameter: 3 mm Liver: No focal lesion identified. Within normal limits in parenchymal echogenicity. Portal vein is patent on color Doppler imaging with normal direction of blood flow towards the liver. Other: None. IMPRESSION: Gallbladder sludge. Electronically Signed   By: Ofilia Neas M.D.   On: 04/04/2021 20:51    Superficial venous thrombosis of the right greater saphenous vein throughout the calf and thigh. No evidence of suppurative thrombophlebitis. 2. No evidence of bilateral lower extremity deep vein thrombosis.  Assessment/Plan:  76 year old male presenting with subjective fever, fatigue, weight loss, diarrhea and found to have severe thrombocytopenia and leukopenia and transaminitis.  Also has splenomegaly.  He has continuous hiccups and bilateral jaw pain. Found to have pulmonary embolism.  Etiology of the above is unclear. infection versus malignancy versus other causes  Infectious work-up has been EBV DNA CMV DNA Bartonella antibodies RPR Ehrlichia antibody Q fever   Diarrhea Clostridium differential and GI panel negative Diarrhea has resolved. Wonder whether it was related to ibuprofen use   Also has right saphenous vein thrombosis No DVT  ID will follow peripherally this weekend-  Discussed the management with patient and care team

## 2021-04-06 NOTE — Progress Notes (Signed)
[facility] Day(s): 2.   Post op day(s): 1  Interval History:  Patient seen and examined No acute events or new complaints overnight.  Patient reports significant confusion this morning; can not remember lunch or dinner but does recall I&D yesterday He reports pain is improved No fever, chills  Continues to have quite significant thrombocytopenia and leukocytopenia Cx from I&D on 02/02 growing GNR, GPC, GPR  Vital signs in last 24 hours: [min-max] current  Temp:  [98.3 F (36.8 C)-99.1 F (37.3 C)] 98.6 F (37 C) (02/03 0500) Pulse Rate:  [74-103] 74 (02/03 0500) Resp:  [18-20] 18 (02/03 0500) BP: (108-120)/(76-85) 116/85 (02/03 0500) SpO2:  [94 %-97 %] 96 % (02/03 0500)     Height: 6\' 1"  (185.4 cm) Weight: 98.3 kg BMI (Calculated): 28.6   Intake/Output last 2 shifts:  02/02 0701 - 02/03 0700 In: 1877 [P.O.:240; I.V.:1637] Out: 2300 [Urine:2300]   Physical Exam:  Constitutional: alert, cooperative and no distress  Respiratory: breathing non-labored at rest  Cardiovascular: regular rate and sinus rhythm  Integumentary: Left posterolateral perirectal I&D site is doing well, no further drainage   Labs:  CBC Latest Ref Rng & Units 04/06/2021 04/05/2021 04/05/2021  WBC 4.0 - 10.5 K/uL 1.1(LL) 1.2(LL) 1.3(LL)  Hemoglobin 13.0 - 17.0 g/dL 12.2(L) 12.9(L) 12.3(L)  Hematocrit 39.0 - 52.0 % 35.0(L) 37.0(L) 35.6(L)  Platelets 150 - 400 K/uL 5(LL) <5(LL) 5(LL)   CMP Latest Ref Rng & Units 04/06/2021 04/05/2021 04/04/2021  Glucose 70 - 99 mg/dL 255(H) - 202(H)  BUN 8 - 23 mg/dL 19 - 22  Creatinine 0.61 - 1.24 mg/dL 0.64 - 0.73  Sodium 135 - 145 mmol/L 130(L) - 131(L)  Potassium 3.5 - 5.1 mmol/L 4.0 - 3.7  Chloride 98 - 111 mmol/L 100 - 97(L)  CO2 22 - 32 mmol/L 22 - 24  Calcium 8.9 - 10.3 mg/dL 7.9(L) - 8.8(L)  Total Protein 6.5 - 8.1 g/dL - 6.2(L) 5.9(L)  Total Bilirubin 0.3 - 1.2 mg/dL - 2.1(H) 2.1(H)  Alkaline Phos 38 - 126 U/L -  131(H) 133(H)  AST 15 - 41 U/L - 108(H) 128(H)  ALT 0 - 44 U/L - 150(H) 174(H)     Imaging studies: No new pertinent imaging studies   Assessment/Plan:  76 y.o. male 1 day s/p Incision and drainage of Left posterolateral perirectal abscess   - Nothing further to add from surgical perspective; doubt small perirectal abscess is source of presentation/lab abnormalities   - Continue IV Abx; follow up Cx; ID on board   - Further management per primary service; we will sign off. We will be readily available as needed    All of the above findings and recommendations were discussed with the patient, and the medical team, and all of patient's questions were answered to his expressed satisfaction.  -- Edison Simon, PA-C [facility] 04/06/2021, 7:57 AM [phone] M-F: 7am - 4pm

## 2021-04-07 DIAGNOSIS — D708 Other neutropenia: Secondary | ICD-10-CM | POA: Diagnosis not present

## 2021-04-07 DIAGNOSIS — I2699 Other pulmonary embolism without acute cor pulmonale: Secondary | ICD-10-CM | POA: Diagnosis not present

## 2021-04-07 DIAGNOSIS — R531 Weakness: Secondary | ICD-10-CM | POA: Diagnosis not present

## 2021-04-07 DIAGNOSIS — D696 Thrombocytopenia, unspecified: Secondary | ICD-10-CM | POA: Diagnosis not present

## 2021-04-07 DIAGNOSIS — K612 Anorectal abscess: Secondary | ICD-10-CM | POA: Diagnosis not present

## 2021-04-07 LAB — CBC WITH DIFFERENTIAL/PLATELET
Abs Immature Granulocytes: 0.11 10*3/uL — ABNORMAL HIGH (ref 0.00–0.07)
Basophils Absolute: 0 10*3/uL (ref 0.0–0.1)
Basophils Relative: 1 %
Eosinophils Absolute: 0 10*3/uL (ref 0.0–0.5)
Eosinophils Relative: 0 %
HCT: 36.9 % — ABNORMAL LOW (ref 39.0–52.0)
Hemoglobin: 12.6 g/dL — ABNORMAL LOW (ref 13.0–17.0)
Immature Granulocytes: 7 %
Lymphocytes Relative: 56 %
Lymphs Abs: 0.9 10*3/uL (ref 0.7–4.0)
MCH: 30.3 pg (ref 26.0–34.0)
MCHC: 34.1 g/dL (ref 30.0–36.0)
MCV: 88.7 fL (ref 80.0–100.0)
Monocytes Absolute: 0.2 10*3/uL (ref 0.1–1.0)
Monocytes Relative: 12 %
Neutro Abs: 0.4 10*3/uL — CL (ref 1.7–7.7)
Neutrophils Relative %: 24 %
Platelets: 16 10*3/uL — CL (ref 150–400)
RBC: 4.16 MIL/uL — ABNORMAL LOW (ref 4.22–5.81)
RDW: 13.7 % (ref 11.5–15.5)
Smear Review: DECREASED
WBC: 1.7 10*3/uL — ABNORMAL LOW (ref 4.0–10.5)
nRBC: 1.2 % — ABNORMAL HIGH (ref 0.0–0.2)

## 2021-04-07 LAB — CMV DNA, QUANTITATIVE, PCR
CMV DNA Quant: NEGATIVE IU/mL
Log10 CMV Qn DNA Pl: UNDETERMINED log10 IU/mL

## 2021-04-07 LAB — BPAM PLATELET PHERESIS
Blood Product Expiration Date: 202302062359
Blood Product Expiration Date: 202302072359
ISSUE DATE / TIME: 202302031138
ISSUE DATE / TIME: 202302032207
Unit Type and Rh: 8400
Unit Type and Rh: 9500

## 2021-04-07 LAB — BASIC METABOLIC PANEL
Anion gap: 8 (ref 5–15)
BUN: 28 mg/dL — ABNORMAL HIGH (ref 8–23)
CO2: 26 mmol/L (ref 22–32)
Calcium: 8.4 mg/dL — ABNORMAL LOW (ref 8.9–10.3)
Chloride: 100 mmol/L (ref 98–111)
Creatinine, Ser: 0.64 mg/dL (ref 0.61–1.24)
GFR, Estimated: >60 mL/min (ref >60)
Glucose, Bld: 237 mg/dL — ABNORMAL HIGH (ref 70–99)
Potassium: 4 mmol/L (ref 3.5–5.1)
Sodium: 134 mmol/L — ABNORMAL LOW (ref 135–145)

## 2021-04-07 LAB — PREPARE PLATELET PHERESIS
Unit division: 0
Unit division: 0

## 2021-04-07 LAB — GLUCOSE, CAPILLARY
Glucose-Capillary: 249 mg/dL — ABNORMAL HIGH (ref 70–99)
Glucose-Capillary: 235 mg/dL — ABNORMAL HIGH (ref 70–99)
Glucose-Capillary: 235 mg/dL — ABNORMAL HIGH (ref 70–99)
Glucose-Capillary: 250 mg/dL — ABNORMAL HIGH (ref 70–99)

## 2021-04-07 MED ORDER — INSULIN GLARGINE-YFGN 100 UNIT/ML ~~LOC~~ SOLN
20.0000 [IU] | Freq: Every day | SUBCUTANEOUS | Status: DC
  Administered 2021-04-08: 20 [IU] via SUBCUTANEOUS
  Filled 2021-04-07: qty 0.2

## 2021-04-07 MED ORDER — CYCLOBENZAPRINE HCL 10 MG PO TABS
10.0000 mg | ORAL_TABLET | Freq: Three times a day (TID) | ORAL | Status: DC
  Administered 2021-04-07 – 2021-04-08 (×3): 10 mg via ORAL
  Filled 2021-04-07 (×3): qty 1

## 2021-04-07 MED ORDER — AMOXICILLIN-POT CLAVULANATE 875-125 MG PO TABS
1.0000 | ORAL_TABLET | Freq: Two times a day (BID) | ORAL | Status: DC
  Administered 2021-04-07 – 2021-04-09 (×4): 1 via ORAL
  Filled 2021-04-07 (×4): qty 1

## 2021-04-07 MED ORDER — PANTOPRAZOLE SODIUM 40 MG PO TBEC
40.0000 mg | DELAYED_RELEASE_TABLET | Freq: Every day | ORAL | Status: DC
  Administered 2021-04-08 – 2021-04-12 (×5): 40 mg via ORAL
  Filled 2021-04-07 (×5): qty 1

## 2021-04-07 MED ORDER — INSULIN GLARGINE-YFGN 100 UNIT/ML ~~LOC~~ SOLN
10.0000 [IU] | Freq: Once | SUBCUTANEOUS | Status: AC
  Administered 2021-04-07: 10 [IU] via SUBCUTANEOUS
  Filled 2021-04-07: qty 0.1

## 2021-04-07 NOTE — Progress Notes (Signed)
PROGRESS NOTE    Eric Joyce  KVQ:[id number] DOB: 1945/09/02 DOA: 04/04/2021 PCP: [facility]  Outpatient Specialists: cardiology    Brief Narrative:  From admission h and p Eric Joyce is a 76 y.o. male with medical history significant of Htn CAD sent by pcp for abnormal labs. Pt has been sick for 3 weeks and hpi as below.  Pt is alert, awake  and reports. He felt bad  x 3 weeks : Lack of energy, fever, chills, sweats Dr. Tamala Bari pcp. Pcp ordered labs and calls him to go hospital.  Symptoms: Pt went to [place] in [place] and back on jan 1st. [place] trip 1 week.  Jan 10th started to feel he was having a  bad cold : aches / fever/ headaches . On the 13th he felt worse : Fever / Chills / sweats /no rash or redness  so he went to Friday he took covid test and was negative. Fever would get worse in evening.  Sat / Sunday better and Monday all the symptoms come back.  Then that wednesday he went to walk in clinic [facility] in [place]. Gave him amoxicillin - took amoxicillin for two weeks but got worse. He got diarrhea - immediately after day 2 of amox. He  took amoxicillin for two weeks even with diarrhea.  stool was black,  Diarrhea was 6 times a day, pt did take pepcid complete and pepto bismal no imodium . Today he saw blood in urine.  The last time he had black stools was yesterday. Pt has never had blood transfusion.  No h/o covid.  SOB started on jan 10th.  No chest pain.   Assessment & Plan:   Principal Problem:   Generalized weakness Active Problems:   CAD (coronary artery disease)   Diabetes mellitus type 2, uncomplicated (HCC)   Alcoholism (HCC)   Other neutropenia (HCC)   Thrombocytopenia (HCC)   Abscess of anal and rectal regions   Fever and chills   Diarrhea   Hypophosphatemia   Acute pulmonary embolism (HCC)   Lactic acidosis   SOB (shortness of breath)   Melena   Rectal abscess  # Thrombocytopenia # Leukopenia #  Splenomegaly Severe. In setting of 3 weeks constitutional symptoms, diarrhea, ibuprofen use. Splenomegaly on CT. No signs cirrhosis on imaging. Renal function appears normal. Immature platelet fraction elevated indicating at least partial bone marrow response. With splenomegaly and lack of response to platelet transfusion peripheral destruction appears to be present. No clear infectious etiology. Heme and ID are involved. This may be some form of ITP. Hepatitis panel neg. HIV neg. C diff neg. Gi pathogen panel negative. Rpr neg. Cmv neg. Ana neg. Lyme neg. Adams13 not particularly low. Ebv igm neg (pcr pending). Is s/p total 3 units platelets. Plts improved to 16 today - cont dexamethasone - f/u EBV pcr - f/u varicella - f/u erlichia - f/u bartonella - f/u rickettsia - f/u flow cytometry  # Perirectal abscess Likely 2/2 recent diarrhea. S/p I and D by gen surg 2/2. Symptomatically improved after that - transition zosyn (started 2/1) to augmentin  # Jaw pain CT of face unremarkable. Improving  # Pulmonary embolus Pro-thrombotic state? Underlying malignancy. Anticoagulation contraindicated given thrombocytopenia. Pvl neg for dvt but shows thrombophlebitis. Antiphospholipid panel pending  # Hiccups # GERD Hiccups likely 2/2 gerd. Improving with sitting upright in chair - increase pantop to 40 - increase flexeril to 10 tid - is already on gabapentin - could add reglan  #  CAD S/p multiple stents. Asymptomatic - cont home metop - hold statin given mild liver dysfunction here - hold aspirin  # HTN Here bnp wnl - holding lisinopril  # T2DM Glucose elevated in setting of steroids - SSI; hold home metformin - increase semglee to 20 qd   DVT prophylaxis: SCDs Code Status: full Family Communication: none @ bedside  Level of care: Progressive Status is: Inpatient Remains inpatient appropriate because: severity of illnes     Consultants:  ID,  heme  Procedures: none  Antimicrobials:  zosyn    Subjective: Hiccups/gerd persist but improved w/ sitting up in chair. No diarrhea. No bleeding.  Objective: Vitals:   04/06/21 2353 04/07/21 0525 04/07/21 0725 04/07/21 1112  BP: 139/83 (!) 146/97 (!) 137/93 135/81  Pulse: 89 87 86 83  Resp: 18 18 18 18   Temp: 97.9 F (36.6 C) 98 F (36.7 C) 97.7 F (36.5 C) 97.7 F (36.5 C)  TempSrc: Oral Oral    SpO2: 95% 92% 93% 91%  Weight:      Height:        Intake/Output Summary (Last 24 hours) at 04/07/2021 1428 Last data filed at 04/07/2021 1259 Gross per 24 hour  Intake 1246 ml  Output 1050 ml  Net 196 ml   Filed Weights   04/05/21 0223  Weight: 98.3 kg    Examination:  General exam: Appears calm and comfortable  Respiratory system: Clear to auscultation. Respiratory effort normal. Cardiovascular system: S1 & S2 heard, RRR. No JVD, murmurs, rubs, gallops or clicks. No pedal edema. Gastrointestinal system: Abdomen is obese, soft and nontender. No organomegaly or masses felt. Normal bowel sounds heard. Central nervous system: Alert and oriented. No focal neurological deficits. Extremities: Symmetric 5 x 5 power. Skin: No rashes, lesions or ulcers. No bruising Psychiatry: Judgement and insight appear normal. Mood & affect appropriate.     Data Reviewed: I have personally reviewed following labs and imaging studies  CBC: Recent Labs  Lab 04/05/21 0237 04/05/21 0936 04/05/21 1350 04/06/21 0530 04/06/21 1541 04/07/21 0458  WBC 1.3* 1.3* 1.2* 1.1* 1.4* 1.7*  NEUTROABS 0.2* 0.2* 0.3* 0.3*  --  0.4*  HGB 12.3* 12.3* 12.9* 12.2* 12.2* 12.6*  HCT 35.8* 35.6* 37.0* 35.0* 34.5* 36.9*  MCV 88.0 87.9 87.5 86.6 88.7 88.7  PLT <5* 5* <5* 5* 6* 16*   Basic Metabolic Panel: Recent Labs  Lab 04/04/21 1505 04/04/21 1843 04/06/21 0530 04/07/21 0458  NA 131*  --  130* 134*  K 3.7  --  4.0 4.0  CL 97*  --  100 100  CO2 24  --  22 26  GLUCOSE 202*  --  255* 237*  BUN 22   --  19 28*  CREATININE 0.73  --  0.64 0.64  CALCIUM 8.8*  --  7.9* 8.4*  MG  --  1.7  --   --   PHOS  --  2.1*  --   --    GFR: Estimated Creatinine Clearance: 100 mL/min (by C-G formula based on SCr of 0.64 mg/dL). Liver Function Tests: Recent Labs  Lab 04/04/21 1603 04/05/21 1350  AST 128* 108*  ALT 174* 150*  ALKPHOS 133* 131*  BILITOT 2.1* 2.1*  PROT 5.9* 6.2*  ALBUMIN 2.4* 2.5*   Recent Labs  Lab 04/04/21 1843  LIPASE 30   Recent Labs  Lab 04/05/21 0237  AMMONIA 30   Coagulation Profile: No results for input(s): INR, PROTIME in the last 168 hours. Cardiac Enzymes: Recent Labs  Lab 04/05/21 0237  CKTOTAL 20*   BNP (last 3 results) No results for input(s): PROBNP in the last 8760 hours. HbA1C: Recent Labs    04/05/21 0008  HGBA1C 6.6*   CBG: Recent Labs  Lab 04/06/21 1131 04/06/21 1724 04/06/21 2139 04/07/21 0727 04/07/21 1112  GLUCAP 238* 230* 198* 249* 235*   Lipid Profile: No results for input(s): CHOL, HDL, LDLCALC, TRIG, CHOLHDL, LDLDIRECT in the last 72 hours. Thyroid Function Tests: Recent Labs    04/04/21 1843  TSH 0.952  FREET4 1.29*   Anemia Panel: Recent Labs    04/04/21 1843  VITAMINB12 >7,500*  FOLATE 17.4  RETICCTPCT 2.0   Urine analysis:    Component Value Date/Time   COLORURINE AMBER (A) 04/05/2021 0955   APPEARANCEUR CLEAR 04/05/2021 0955   APPEARANCEUR Clear 02/15/2021 0939   LABSPEC 1.015 04/05/2021 0955   LABSPEC 1.017 04/27/2012 0853   PHURINE 5.5 04/05/2021 0955   GLUCOSEU NEGATIVE 04/05/2021 0955   GLUCOSEU Negative 04/27/2012 0853   HGBUR MODERATE (A) 04/05/2021 0955   BILIRUBINUR SMALL (A) 04/05/2021 0955   BILIRUBINUR Negative 02/15/2021 0939   BILIRUBINUR Negative 04/27/2012 0853   KETONESUR TRACE (A) 04/05/2021 0955   PROTEINUR 30 (A) 04/05/2021 0955   NITRITE NEGATIVE 04/05/2021 0955   LEUKOCYTESUR NEGATIVE 04/05/2021 0955   LEUKOCYTESUR Negative 04/27/2012 0853   Sepsis  Labs: @LABRCNTIP (procalcitonin:4,lacticidven:4)  ) Recent Results (from the past 240 hour(s))  Resp Panel by RT-PCR (Flu A&B, Covid) Nasopharyngeal Swab     Status: None   Collection Time: 04/04/21  4:03 PM   Specimen: Nasopharyngeal Swab; Nasopharyngeal(NP) swabs in vial transport medium  Result Value Ref Range Status   SARS Coronavirus 2 by RT PCR NEGATIVE NEGATIVE Final    Comment: (NOTE) SARS-CoV-2 target nucleic acids are NOT DETECTED.  The SARS-CoV-2 RNA is generally detectable in upper respiratory specimens during the acute phase of infection. The lowest concentration of SARS-CoV-2 viral copies this assay can detect is 138 copies/mL. A negative result does not preclude SARS-Cov-2 infection and should not be used as the sole basis for treatment or other patient management decisions. A negative result may occur with  improper specimen collection/handling, submission of specimen other than nasopharyngeal swab, presence of viral mutation(s) within the areas targeted by this assay, and inadequate number of viral copies(<138 copies/mL). A negative result must be combined with clinical observations, patient history, and epidemiological information. The expected result is Negative.  Fact Sheet for Patients:  EntrepreneurPulse.com.au  Fact Sheet for Healthcare Providers:  IncredibleEmployment.be  This test is no t yet approved or cleared by the [place] FDA and  has been authorized for detection and/or diagnosis of SARS-CoV-2 by FDA under an Emergency Use Authorization (EUA). This EUA will remain  in effect (meaning this test can be used) for the duration of the COVID-19 declaration under Section 564(b)(1) of the Act, 21 U.S.C.section 360bbb-3(b)(1), unless the authorization is terminated  or revoked sooner.       Influenza A by PCR NEGATIVE NEGATIVE Final   Influenza B by PCR NEGATIVE NEGATIVE Final    Comment: (NOTE) The Xpert  Xpress SARS-CoV-2/FLU/RSV plus assay is intended as an aid in the diagnosis of influenza from Nasopharyngeal swab specimens and should not be used as a sole basis for treatment. Nasal washings and aspirates are unacceptable for Xpert Xpress SARS-CoV-2/FLU/RSV testing.  Fact Sheet for Patients: EntrepreneurPulse.com.au  Fact Sheet for Healthcare Providers: IncredibleEmployment.be  This test is not yet approved or cleared by the [place]  FDA and has been authorized for detection and/or diagnosis of SARS-CoV-2 by FDA under an Emergency Use Authorization (EUA). This EUA will remain in effect (meaning this test can be used) for the duration of the COVID-19 declaration under Section 564(b)(1) of the Act, 21 U.S.C. section 360bbb-3(b)(1), unless the authorization is terminated or revoked.  Performed at [facility], [address] [place], [place] [address]   Blood culture (routine x 2)     Status: None (Preliminary result)   Collection Time: 04/04/21  6:43 PM   Specimen: BLOOD  Result Value Ref Range Status   Specimen Description BLOOD RAC  Final   Special Requests BOTTLES DRAWN AEROBIC AND ANAEROBIC BCAV  Final   Culture   Final    NO GROWTH 3 DAYS Performed at [facility], [address], [place], [place] [address]    Report Status PENDING  Incomplete  Blood culture (routine x 2)     Status: None (Preliminary result)   Collection Time: 04/04/21  6:43 PM   Specimen: BLOOD  Result Value Ref Range Status   Specimen Description BLOOD BLRA  Final   Special Requests BOTTLES DRAWN AEROBIC AND ANAEROBIC [place]  Final   Culture   Final    NO GROWTH 3 DAYS Performed at [facility], [address], [place], [place] [address]    Report Status PENDING  Incomplete  C Difficile Quick Screen w PCR reflex     Status: None   Collection Time: 04/05/21  8:35 AM   Specimen: STOOL  Result Value Ref Range Status   C Diff  antigen NEGATIVE NEGATIVE Final   C Diff toxin NEGATIVE NEGATIVE Final   C Diff interpretation No C. difficile detected.  Final    Comment: Performed at [facility], [address] [place], [place] [address]  Gastrointestinal Panel by PCR , Stool     Status: None   Collection Time: 04/05/21  1:44 PM   Specimen: STOOL  Result Value Ref Range Status   Campylobacter species NOT DETECTED NOT DETECTED Final   Plesimonas shigelloides NOT DETECTED NOT DETECTED Final   Salmonella species NOT DETECTED NOT DETECTED Final   Yersinia enterocolitica NOT DETECTED NOT DETECTED Final   Vibrio species NOT DETECTED NOT DETECTED Final   Vibrio cholerae NOT DETECTED NOT DETECTED Final   Enteroaggregative E coli (EAEC) NOT DETECTED NOT DETECTED Final   Enteropathogenic E coli (EPEC) NOT DETECTED NOT DETECTED Final   Enterotoxigenic E coli (ETEC) NOT DETECTED NOT DETECTED Final   Shiga like toxin producing E coli (STEC) NOT DETECTED NOT DETECTED Final   Shigella/Enteroinvasive E coli (EIEC) NOT DETECTED NOT DETECTED Final   Cryptosporidium NOT DETECTED NOT DETECTED Final   Cyclospora cayetanensis NOT DETECTED NOT DETECTED Final   Entamoeba histolytica NOT DETECTED NOT DETECTED Final   Giardia lamblia NOT DETECTED NOT DETECTED Final   Adenovirus F40/41 NOT DETECTED NOT DETECTED Final   Astrovirus NOT DETECTED NOT DETECTED Final   Norovirus GI/GII NOT DETECTED NOT DETECTED Final   Rotavirus A NOT DETECTED NOT DETECTED Final   Sapovirus (I, II, IV, and V) NOT DETECTED NOT DETECTED Final    Comment: Performed at [facility], [address] [place], [place] [address]  Aerobic/Anaerobic Culture w Gram Stain (surgical/deep wound)     Status: None (Preliminary result)   Collection Time: 04/05/21  5:33 PM   Specimen: Wound  Result Value Ref Range Status   Specimen Description   Final  WOUND Performed at [facility], [address] [place], [place] [address]    Special  Requests   Final    PERIRECTAL ABSCESS Performed at [facility], [place], [place] [address]    Gram Stain   Final    FEW WBC PRESENT, PREDOMINANTLY MONONUCLEAR ABUNDANT GRAM NEGATIVE RODS FEW GRAM POSITIVE COCCI FEW GRAM POSITIVE RODS    Culture   Final    CULTURE REINCUBATED FOR BETTER GROWTH Performed at [facility], [place] [address] [place], [place] [address]    Report Status PENDING  Incomplete         Radiology Studies: US Venous Img Lower Bilateral (DVT)  Result Date: 04/06/2021 CLINICAL DATA:  76 year old male with history of pulmonary embolism. EXAM: BILATERAL LOWER EXTREMITY VENOUS DOPPLER ULTRASOUND TECHNIQUE: Gray-scale sonography with graded compression, as well as color Doppler and duplex ultrasound were performed to evaluate the lower extremity deep venous systems from the level of the common femoral vein and including the common femoral, femoral, profunda femoral, popliteal and calf veins including the posterior tibial, peroneal and gastrocnemius veins when visible. The superficial great saphenous vein was also interrogated. Spectral Doppler was utilized to evaluate flow at rest and with distal augmentation maneuvers in the common femoral, femoral and popliteal veins. COMPARISON:  None. FINDINGS: RIGHT LOWER EXTREMITY Common Femoral Vein: No evidence of thrombus. Normal compressibility, respiratory phasicity and response to augmentation. Saphenofemoral Junction: No evidence of thrombus. Normal compressibility and flow on color Doppler imaging. Profunda Femoral Vein: No evidence of thrombus. Normal compressibility and flow on color Doppler imaging. Femoral Vein: No evidence of thrombus. Normal compressibility, respiratory phasicity and response to augmentation. Popliteal Vein: No evidence of thrombus. Normal compressibility, respiratory phasicity and response to augmentation. Calf Veins: No evidence of thrombus. Normal compressibility and flow on  color Doppler imaging. Superficial Great Saphenous Vein: Occlusive thrombus throughout the thigh and the calf to approximately 2.7 cm peripheral to the saphenofemoral junction. Other Findings:  None. LEFT LOWER EXTREMITY Common Femoral Vein: No evidence of thrombus. Normal compressibility, respiratory phasicity and response to augmentation. Saphenofemoral Junction: No evidence of thrombus. Normal compressibility and flow on color Doppler imaging. Profunda Femoral Vein: No evidence of thrombus. Normal compressibility and flow on color Doppler imaging. Femoral Vein: No evidence of thrombus. Normal compressibility, respiratory phasicity and response to augmentation. Popliteal Vein: No evidence of thrombus. Normal compressibility, respiratory phasicity and response to augmentation. Calf Veins: No evidence of thrombus. Normal compressibility and flow on color Doppler imaging. Superficial Great Saphenous Vein: No evidence of thrombus. Normal compressibility. Other Findings:  None. IMPRESSION: 1. Superficial venous thrombosis of the right greater saphenous vein throughout the calf and thigh. No evidence of suppurative thrombophlebitis. 2. No evidence of bilateral lower extremity deep vein thrombosis. Ruthann Cancer, MD [facility] Electronically Signed   By: Ruthann Cancer M.D.   On: 04/06/2021 09:15        Scheduled Meds:  sodium chloride   Intravenous Once   cyclobenzaprine  10 mg Oral TID   dexamethasone  40 mg Intravenous Daily   gabapentin  400 mg Oral TID   insulin aspart  0-20 Units Subcutaneous TID WC   insulin aspart  0-5 Units Subcutaneous QHS   insulin glargine-yfgn  10 Units Subcutaneous Daily   metoprolol succinate  50 mg Oral Daily   multivitamin with minerals  1 tablet Oral Daily   [START ON 04/08/2021] pantoprazole  40 mg Oral Daily  Ensure Max Protein  11 oz Oral BID   Continuous Infusions:  piperacillin-tazobactam (ZOSYN)  IV 3.375  g (04/07/21 0814)     LOS: 3 days    Time spent: 25 min    Desma Maxim, MD [facility]   If 7PM-7AM, please contact night-coverage [email or web address] Password [email or web address] 04/07/2021, 2:28 PM

## 2021-04-07 NOTE — Progress Notes (Signed)
Date of Admission:  04/04/2021      ID: Eric Joyce is a 76 y.o. male Principal Problem:   Generalized weakness Active Problems:   CAD (coronary artery disease)   Diabetes mellitus type 2, uncomplicated (HCC)   Alcoholism ([place])   Other neutropenia (HCC)   Thrombocytopenia (HCC)   Abscess of anal and rectal regions   Fever and chills   Diarrhea   Hypophosphatemia   Acute pulmonary embolism (HCC)   Lactic acidosis   SOB (shortness of breath)   Melena   Rectal abscess    Subjective: Feeling better Says he sat in the chair and that made his hiccoughs go away He participated with PT No fever since admission No diarrhea  Medications:   sodium chloride   Intravenous Once   amoxicillin-clavulanate  1 tablet Oral Q12H   cyclobenzaprine  10 mg Oral TID   dexamethasone  40 mg Intravenous Daily   gabapentin  400 mg Oral TID   insulin aspart  0-20 Units Subcutaneous TID WC   insulin aspart  0-5 Units Subcutaneous QHS   [START ON 04/08/2021] insulin glargine-yfgn  20 Units Subcutaneous Daily   metoprolol succinate  50 mg Oral Daily   multivitamin with minerals  1 tablet Oral Daily   [START ON 04/08/2021] pantoprazole  40 mg Oral Daily   Ensure Max Protein  11 oz Oral BID    Objective: Vital signs in last 24 hours: Patient Vitals for the past 24 hrs:  BP Temp Temp src Pulse Resp SpO2  04/07/21 1631 (!) 121/92 97.7 F (36.5 C) -- 80 18 96 %  04/07/21 1112 135/81 97.7 F (36.5 C) -- 83 18 91 %  04/07/21 0725 (!) 137/93 97.7 F (36.5 C) -- 86 18 93 %  04/07/21 0525 (!) 146/97 98 F (36.7 C) Oral 87 18 92 %  04/06/21 2353 139/83 97.9 F (36.6 C) Oral 89 18 95 %  04/06/21 2232 134/88 98.1 F (36.7 C) Oral 85 17 94 %  04/06/21 2200 (!) 141/101 98.4 F (36.9 C) -- 87 17 93 %  04/06/21 2135 133/76 98.6 F (37 C) Oral 86 17 94 %    PHYSICAL EXAM:  General: Alert, no distress Head: Normocephalic, without obvious abnormality, atraumatic. Eyes: Conjunctivae clear,  anicteric sclerae. Pupils are equal ENT Nares normal. No drainage or sinus tenderness. Lips, mucosa, and tongue normal. No Thrush Neck: Supple, symmetrical, no adenopathy, thyroid: non tender no carotid bruit and no JVD. Back: No CVA tenderness. Lungs: Clear to auscultation bilaterally. No Wheezing or Rhonchi. No rales. Heart: Regular rate and rhythm, no murmur, rub or gallop. Abdomen: Soft, non-tender,not distended. Bowel sounds normal. No masses Extremities: atraumatic, no cyanosis. No edema. No clubbing Skin: No rashes or lesions. Or bruising Lymph: Cervical, supraclavicular normal. Neurologic: Grossly non-focal  Lab Results Recent Labs    04/06/21 0530 04/06/21 1541 04/07/21 0458  WBC 1.1* 1.4* 1.7*  HGB 12.2* 12.2* 12.6*  HCT 35.0* 34.5* 36.9*  NA 130*  --  134*  K 4.0  --  4.0  CL 100  --  100  CO2 22  --  26  BUN 19  --  28*  CREATININE 0.64  --  0.64   Liver Panel Recent Labs    04/05/21 1350  PROT 6.2*  ALBUMIN 2.5*  AST 108*  ALT 150*  ALKPHOS 131*  BILITOT 2.1*  BILIDIR 0.9*  IBILI 1.2*   Sedimentation Rate Recent Labs    04/05/21 1758  ESRSEDRATE 21*   C-Reactive Protein Recent Labs    04/05/21 0237  CRP 21.9*    Microbiology: Blood cultures sent on 04/04/2021 negative so far Abscess cultures so far is Proteus Studies/Results: US Venous Img Lower Bilateral (DVT)  Result Date: 04/06/2021 CLINICAL DATA:  76 year old male with history of pulmonary embolism. EXAM: BILATERAL LOWER EXTREMITY VENOUS DOPPLER ULTRASOUND TECHNIQUE: Gray-scale sonography with graded compression, as well as color Doppler and duplex ultrasound were performed to evaluate the lower extremity deep venous systems from the level of the common femoral vein and including the common femoral, femoral, profunda femoral, popliteal and calf veins including the posterior tibial, peroneal and gastrocnemius veins when visible. The superficial great saphenous vein was also interrogated.  Spectral Doppler was utilized to evaluate flow at rest and with distal augmentation maneuvers in the common femoral, femoral and popliteal veins. COMPARISON:  None. FINDINGS: RIGHT LOWER EXTREMITY Common Femoral Vein: No evidence of thrombus. Normal compressibility, respiratory phasicity and response to augmentation. Saphenofemoral Junction: No evidence of thrombus. Normal compressibility and flow on color Doppler imaging. Profunda Femoral Vein: No evidence of thrombus. Normal compressibility and flow on color Doppler imaging. Femoral Vein: No evidence of thrombus. Normal compressibility, respiratory phasicity and response to augmentation. Popliteal Vein: No evidence of thrombus. Normal compressibility, respiratory phasicity and response to augmentation. Calf Veins: No evidence of thrombus. Normal compressibility and flow on color Doppler imaging. Superficial Great Saphenous Vein: Occlusive thrombus throughout the thigh and the calf to approximately 2.7 cm peripheral to the saphenofemoral junction. Other Findings:  None. LEFT LOWER EXTREMITY Common Femoral Vein: No evidence of thrombus. Normal compressibility, respiratory phasicity and response to augmentation. Saphenofemoral Junction: No evidence of thrombus. Normal compressibility and flow on color Doppler imaging. Profunda Femoral Vein: No evidence of thrombus. Normal compressibility and flow on color Doppler imaging. Femoral Vein: No evidence of thrombus. Normal compressibility, respiratory phasicity and response to augmentation. Popliteal Vein: No evidence of thrombus. Normal compressibility, respiratory phasicity and response to augmentation. Calf Veins: No evidence of thrombus. Normal compressibility and flow on color Doppler imaging. Superficial Great Saphenous Vein: No evidence of thrombus. Normal compressibility. Other Findings:  None. IMPRESSION: 1. Superficial venous thrombosis of the right greater saphenous vein throughout the calf and thigh. No  evidence of suppurative thrombophlebitis. 2. No evidence of bilateral lower extremity deep vein thrombosis. Ruthann Cancer, MD [facility] Radiology Electronically Signed   By: Ruthann Cancer M.D.   On: 04/06/2021 09:15    Superficial venous thrombosis of the right greater saphenous vein throughout the calf and thigh. No evidence of suppurative thrombophlebitis. 2. No evidence of bilateral lower extremity deep vein thrombosis.  Assessment/Plan:  76 year old male presenting with subjective fever, fatigue, weight loss, diarrhea and found to have severe thrombocytopenia and leukopenia and transaminitis.  Also has splenomegaly.  He has continuous hiccups and bilateral jaw pain. Found to have pulmonary embolism.  Etiology of the above is unclear. infection versus malignancy versus other causes  No fever since admission  HE is improving  Infectious work-up has been sent EBV DNA-P CMV DNA-neg Bartonella antibodies-P RPR-neg Ehrlichia antibody_P Q fever-P Cdiff neg GI panel NEg  Improving platelet and wbc  Diarrhea on admission-  has resolved. Clostridium differential and GI panel negative  Ana; abscess- had I/D- was on zosyn- changed to augmentin by primary team  Also has right saphenous vein thrombosis-  No DVT  Discussed the management with patient

## 2021-04-07 NOTE — Progress Notes (Signed)
Mobility Specialist - Progress Note   04/07/21 1200  Mobility  Activity Transferred from bed to chair;Dangled on edge of bed;Stood at bedside  Range of Motion/Exercises Right leg;Left leg  Level of Assistance Moderate assist, patient does 50-74%  Assistive Device Standard walker  Distance Ambulated (ft) 0 ft  Activity Response Tolerated well  $Mobility charge 1 Mobility     Pre-mobility: 96% SpO2 Post-mobility: 112 HR, 92% SPO2  Pt sleeping in bed using RA upon arrival. Pt agreed to session with little encouragement. Pt initially complained of nausea and fatigue, but denied after session completed. Pt complained of pain in upper abdominal region d/t acid reflux with little relief from meds --- rates pain 8/10. Pt sat EOB with ModA. Pt completed Therex. Pt completed STS with MinA. Pt transferred B-C with supervision. Pt was left in chair with chair alarm set and needs in reach.RN notified.  Kathee Delton Mobility Specialist 04/07/21, 1:03 PM

## 2021-04-08 DIAGNOSIS — R531 Weakness: Secondary | ICD-10-CM | POA: Diagnosis not present

## 2021-04-08 LAB — CBC
HCT: 35.1 % — ABNORMAL LOW (ref 39.0–52.0)
Hemoglobin: 12 g/dL — ABNORMAL LOW (ref 13.0–17.0)
MCH: 30.4 pg (ref 26.0–34.0)
MCHC: 34.2 g/dL (ref 30.0–36.0)
MCV: 88.9 fL (ref 80.0–100.0)
Platelets: 19 10*3/uL — CL (ref 150–400)
RBC: 3.95 MIL/uL — ABNORMAL LOW (ref 4.22–5.81)
RDW: 14.6 % (ref 11.5–15.5)
WBC: 2.5 10*3/uL — ABNORMAL LOW (ref 4.0–10.5)
nRBC: 4 % — ABNORMAL HIGH (ref 0.0–0.2)

## 2021-04-08 LAB — VARICELLA-ZOSTER BY PCR: Varicella-Zoster, PCR: NEGATIVE

## 2021-04-08 LAB — COMPREHENSIVE METABOLIC PANEL
ALT: 81 U/L — ABNORMAL HIGH (ref 0–44)
AST: 27 U/L (ref 15–41)
Albumin: 2.4 g/dL — ABNORMAL LOW (ref 3.5–5.0)
Alkaline Phosphatase: 104 U/L (ref 38–126)
Anion gap: 6 (ref 5–15)
BUN: 31 mg/dL — ABNORMAL HIGH (ref 8–23)
CO2: 27 mmol/L (ref 22–32)
Calcium: 8.3 mg/dL — ABNORMAL LOW (ref 8.9–10.3)
Chloride: 103 mmol/L (ref 98–111)
Creatinine, Ser: 0.66 mg/dL (ref 0.61–1.24)
GFR, Estimated: >60 mL/min (ref >60)
Glucose, Bld: 210 mg/dL — ABNORMAL HIGH (ref 70–99)
Potassium: 4.4 mmol/L (ref 3.5–5.1)
Sodium: 136 mmol/L (ref 135–145)
Total Bilirubin: 1.4 mg/dL — ABNORMAL HIGH (ref 0.3–1.2)
Total Protein: 5.9 g/dL — ABNORMAL LOW (ref 6.5–8.1)

## 2021-04-08 LAB — GLUCOSE, CAPILLARY
Glucose-Capillary: 203 mg/dL — ABNORMAL HIGH (ref 70–99)
Glucose-Capillary: 238 mg/dL — ABNORMAL HIGH (ref 70–99)
Glucose-Capillary: 292 mg/dL — ABNORMAL HIGH (ref 70–99)
Glucose-Capillary: 219 mg/dL — ABNORMAL HIGH (ref 70–99)

## 2021-04-08 MED ORDER — INSULIN GLARGINE-YFGN 100 UNIT/ML ~~LOC~~ SOLN
10.0000 [IU] | Freq: Once | SUBCUTANEOUS | Status: AC
  Administered 2021-04-08: 10 [IU] via SUBCUTANEOUS
  Filled 2021-04-08: qty 0.1

## 2021-04-08 MED ORDER — METOCLOPRAMIDE HCL 10 MG PO TABS
10.0000 mg | ORAL_TABLET | Freq: Three times a day (TID) | ORAL | Status: DC
  Administered 2021-04-08 – 2021-04-12 (×16): 10 mg via ORAL
  Filled 2021-04-08 (×16): qty 1

## 2021-04-08 MED ORDER — INSULIN GLARGINE-YFGN 100 UNIT/ML ~~LOC~~ SOLN
30.0000 [IU] | Freq: Every day | SUBCUTANEOUS | Status: DC
  Administered 2021-04-09 – 2021-04-12 (×4): 30 [IU] via SUBCUTANEOUS
  Filled 2021-04-08 (×6): qty 0.3

## 2021-04-08 NOTE — Progress Notes (Signed)
PROGRESS NOTE    Eric Joyce  DJS:[id number] DOB: November 05, 1945 DOA: 04/04/2021 PCP: [place]  Outpatient Specialists: cardiology    Brief Narrative:  From admission h and p Eric Joyce is a 76 y.o. male with medical history significant of Htn CAD sent by pcp for abnormal labs. Pt has been sick for 3 weeks and hpi as below.  Pt is alert, awake  and reports. He felt bad  x 3 weeks : Lack of energy, fever, chills, sweats Dr. Tamala Bari pcp. Pcp ordered labs and calls him to go hospital.  Symptoms: Pt went to [place] in [place] and back on jan 1st. [place] trip 1 week.  Jan 10th started to feel he was having a  bad cold : aches / fever/ headaches . On the 13th he felt worse : Fever / Chills / sweats /no rash or redness  so he went to Friday he took covid test and was negative. Fever would get worse in evening.  Sat / Sunday better and Monday all the symptoms come back.  Then that wednesday he went to walk in clinic [facility] in [place]. Gave him amoxicillin - took amoxicillin for two weeks but got worse. He got diarrhea - immediately after day 2 of amox. He  took amoxicillin for two weeks even with diarrhea.  stool was black,  Diarrhea was 6 times a day, pt did take pepcid complete and pepto bismal no imodium . Today he saw blood in urine.  The last time he had black stools was yesterday. Pt has never had blood transfusion.  No h/o covid.  SOB started on jan 10th.  No chest pain.   Assessment & Plan:   Principal Problem:   Generalized weakness Active Problems:   CAD (coronary artery disease)   Diabetes mellitus type 2, uncomplicated (HCC)   Alcoholism (HCC)   Other neutropenia (HCC)   Thrombocytopenia (HCC)   Abscess of anal and rectal regions   Fever and chills   Diarrhea   Hypophosphatemia   Acute pulmonary embolism (HCC)   Lactic acidosis   SOB (shortness of breath)   Melena   Rectal abscess  # Thrombocytopenia # Leukopenia #  Splenomegaly Severe. In setting of 3 weeks constitutional symptoms, diarrhea, ibuprofen use. Splenomegaly on CT. No signs cirrhosis on imaging. Renal function appears normal. Immature platelet fraction elevated indicating at least partial bone marrow response. With splenomegaly and lack of response to platelet transfusion peripheral destruction appears to be present. No clear infectious etiology. Heme and ID are involved. This may be some form of ITP. Hepatitis panel neg. HIV neg. C diff neg. Gi pathogen panel negative. Rpr neg. Cmv neg. Ana neg. Lyme neg. Adams13 not particularly low. Ebv igm neg (pcr pending). Is s/p total 3 units platelets, lasts on 2/3. Plts improved to 19 today. Discussed case w/ heme today, advises continuing steroids, possibly try ivig tomorrow - cont dexamethasone, today will be day 4 - f/u EBV pcr - f/u varicella - f/u erlichia - f/u bartonella - f/u rickettsia - f/u flow cytometry  # Perirectal abscess Likely 2/2 recent diarrhea. S/p I and D by gen surg 2/2. Symptomatically improved after that, plan for 5 day course - transitioned zosyn (started 2/1) to augmentin - f/u culture  # Jaw pain CT of face unremarkable. resolved  # Pulmonary embolus Pro-thrombotic state? Underlying malignancy. Anticoagulation contraindicated given thrombocytopenia. Pvl neg for dvt but shows thrombophlebitis. Antiphospholipid panel pending  # Hiccups # GERD Hiccups likely 2/2 gerd.  Underlying malignancy? - increased pantop to 40 - stop flexeril, will trial reglan - is already on gabapentin - could add reglan - will discuss w/ gi this week  # CAD S/p multiple stents. Asymptomatic - cont home metop - hold statin given mild liver dysfunction here - hold aspirin  # HTN Here bnp wnl - holding lisinopril  # T2DM Glucose elevated in setting of steroids - SSI; hold home metformin - increase semglee to 30 qd   DVT prophylaxis: SCDs Code Status: full Family Communication: none  @ bedside  Level of care: Progressive Status is: Inpatient Remains inpatient appropriate because: severity of illnes     Consultants:  ID, heme  Procedures: none  Antimicrobials:  zosyn    Subjective: Hiccups persist and are main complaint  Objective: Vitals:   04/07/21 2355 04/08/21 0510 04/08/21 0748 04/08/21 1150  BP: 126/84 118/79 129/78 (!) 134/96  Pulse: 79 80 99 87  Resp: 11 19 16 16   Temp: 97.9 F (36.6 C) 98 F (36.7 C) 97.7 F (36.5 C) 97.9 F (36.6 C)  TempSrc: Oral Oral Oral Oral  SpO2: 96% 95% 99% 96%  Weight:      Height:        Intake/Output Summary (Last 24 hours) at 04/08/2021 1239 Last data filed at 04/07/2021 1635 Gross per 24 hour  Intake --  Output 300 ml  Net -300 ml   Filed Weights   04/05/21 0223  Weight: 98.3 kg    Examination:  General exam: Appears calm and comfortable  Respiratory system: Clear to auscultation. Respiratory effort normal. Cardiovascular system: S1 & S2 heard, RRR. No JVD, murmurs, rubs, gallops or clicks. No pedal edema. Gastrointestinal system: Abdomen is obese, soft and nontender. No organomegaly or masses felt. Normal bowel sounds heard. Central nervous system: Alert and oriented. No focal neurological deficits. Extremities: Symmetric 5 x 5 power. Skin: No rashes, lesions or ulcers. No bruising Psychiatry: Judgement and insight appear normal. Mood & affect appropriate.     Data Reviewed: I have personally reviewed following labs and imaging studies  CBC: Recent Labs  Lab 04/05/21 0237 04/05/21 0936 04/05/21 1350 04/06/21 0530 04/06/21 1541 04/07/21 0458 04/08/21 0453  WBC 1.3* 1.3* 1.2* 1.1* 1.4* 1.7* 2.5*  NEUTROABS 0.2* 0.2* 0.3* 0.3*  --  0.4*  --   HGB 12.3* 12.3* 12.9* 12.2* 12.2* 12.6* 12.0*  HCT 35.8* 35.6* 37.0* 35.0* 34.5* 36.9* 35.1*  MCV 88.0 87.9 87.5 86.6 88.7 88.7 88.9  PLT <5* 5* <5* 5* 6* 16* 19*   Basic Metabolic Panel: Recent Labs  Lab 04/04/21 1505 04/04/21 1843  04/06/21 0530 04/07/21 0458 04/08/21 0453  NA 131*  --  130* 134* 136  K 3.7  --  4.0 4.0 4.4  CL 97*  --  100 100 103  CO2 24  --  22 26 27   GLUCOSE 202*  --  255* 237* 210*  BUN 22  --  19 28* 31*  CREATININE 0.73  --  0.64 0.64 0.66  CALCIUM 8.8*  --  7.9* 8.4* 8.3*  MG  --  1.7  --   --   --   PHOS  --  2.1*  --   --   --    GFR: Estimated Creatinine Clearance: 100 mL/min (by C-G formula based on SCr of 0.66 mg/dL). Liver Function Tests: Recent Labs  Lab 04/04/21 1603 04/05/21 1350 04/08/21 0453  AST 128* 108* 27  ALT 174* 150* 81*  ALKPHOS 133* 131* 104  BILITOT 2.1* 2.1* 1.4*  PROT 5.9* 6.2* 5.9*  ALBUMIN 2.4* 2.5* 2.4*   Recent Labs  Lab 04/04/21 1843  LIPASE 30   Recent Labs  Lab 04/05/21 0237  AMMONIA 30   Coagulation Profile: No results for input(s): INR, PROTIME in the last 168 hours. Cardiac Enzymes: Recent Labs  Lab 04/05/21 0237  CKTOTAL 20*   BNP (last 3 results) No results for input(s): PROBNP in the last 8760 hours. HbA1C: No results for input(s): HGBA1C in the last 72 hours.  CBG: Recent Labs  Lab 04/07/21 1112 04/07/21 1628 04/07/21 1951 04/08/21 0747 04/08/21 1151  GLUCAP 235* 235* 250* 203* 238*   Lipid Profile: No results for input(s): CHOL, HDL, LDLCALC, TRIG, CHOLHDL, LDLDIRECT in the last 72 hours. Thyroid Function Tests: No results for input(s): TSH, T4TOTAL, FREET4, T3FREE, THYROIDAB in the last 72 hours.  Anemia Panel: No results for input(s): VITAMINB12, FOLATE, FERRITIN, TIBC, IRON, RETICCTPCT in the last 72 hours.  Urine analysis:    Component Value Date/Time   COLORURINE AMBER (A) 04/05/2021 0955   APPEARANCEUR CLEAR 04/05/2021 [place] 02/15/2021 0939   LABSPEC 1.015 04/05/2021 0955   LABSPEC 1.017 04/27/2012 0853   PHURINE 5.5 04/05/2021 0955   GLUCOSEU NEGATIVE 04/05/2021 0955   GLUCOSEU Negative 04/27/2012 0853   HGBUR MODERATE (A) 04/05/2021 0955   BILIRUBINUR SMALL (A) 04/05/2021  0955   BILIRUBINUR Negative 02/15/2021 0939   BILIRUBINUR Negative 04/27/2012 0853   KETONESUR TRACE (A) 04/05/2021 0955   PROTEINUR 30 (A) 04/05/2021 0955   NITRITE NEGATIVE 04/05/2021 0955   LEUKOCYTESUR NEGATIVE 04/05/2021 0955   LEUKOCYTESUR Negative 04/27/2012 0853   Sepsis Labs: @LABRCNTIP (procalcitonin:4,lacticidven:4)  ) Recent Results (from the past 240 hour(s))  Resp Panel by RT-PCR (Flu A&B, Covid) Nasopharyngeal Swab     Status: None   Collection Time: 04/04/21  4:03 PM   Specimen: Nasopharyngeal Swab; Nasopharyngeal(NP) swabs in vial transport medium  Result Value Ref Range Status   SARS Coronavirus 2 by RT PCR NEGATIVE NEGATIVE Final    Comment: (NOTE) SARS-CoV-2 target nucleic acids are NOT DETECTED.  The SARS-CoV-2 RNA is generally detectable in upper respiratory specimens during the acute phase of infection. The lowest concentration of SARS-CoV-2 viral copies this assay can detect is 138 copies/mL. A negative result does not preclude SARS-Cov-2 infection and should not be used as the sole basis for treatment or other patient management decisions. A negative result may occur with  improper specimen collection/handling, submission of specimen other than nasopharyngeal swab, presence of viral mutation(s) within the areas targeted by this assay, and inadequate number of viral copies(<138 copies/mL). A negative result must be combined with clinical observations, patient history, and epidemiological information. The expected result is Negative.  Fact Sheet for Patients:  EntrepreneurPulse.com.au  Fact Sheet for Healthcare Providers:  IncredibleEmployment.be  This test is no t yet approved or cleared by the [place] FDA and  has been authorized for detection and/or diagnosis of SARS-CoV-2 by FDA under an Emergency Use Authorization (EUA). This EUA will remain  in effect (meaning this test can be used) for the duration of  the COVID-19 declaration under Section 564(b)(1) of the Act, 21 U.S.C.section 360bbb-3(b)(1), unless the authorization is terminated  or revoked sooner.       Influenza A by PCR NEGATIVE NEGATIVE Final   Influenza B by PCR NEGATIVE NEGATIVE Final    Comment: (NOTE) The Xpert Xpress SARS-CoV-2/FLU/RSV plus assay is intended as an aid in the diagnosis of  influenza from Nasopharyngeal swab specimens and should not be used as a sole basis for treatment. Nasal washings and aspirates are unacceptable for Xpert Xpress SARS-CoV-2/FLU/RSV testing.  Fact Sheet for Patients: EntrepreneurPulse.com.au  Fact Sheet for Healthcare Providers: IncredibleEmployment.be  This test is not yet approved or cleared by the [place] FDA and has been authorized for detection and/or diagnosis of SARS-CoV-2 by FDA under an Emergency Use Authorization (EUA). This EUA will remain in effect (meaning this test can be used) for the duration of the COVID-19 declaration under Section 564(b)(1) of the Act, 21 U.S.C. section 360bbb-3(b)(1), unless the authorization is terminated or revoked.  Performed at [facility], [address] [place], [place] [address]   Blood culture (routine x 2)     Status: None (Preliminary result)   Collection Time: 04/04/21  6:43 PM   Specimen: BLOOD  Result Value Ref Range Status   Specimen Description BLOOD RAC  Final   Special Requests BOTTLES DRAWN AEROBIC AND ANAEROBIC BCAV  Final   Culture   Final    NO GROWTH 4 DAYS Performed at [facility], [address] [place], [place] [address]    Report Status PENDING  Incomplete  Blood culture (routine x 2)     Status: None (Preliminary result)   Collection Time: 04/04/21  6:43 PM   Specimen: BLOOD  Result Value Ref Range Status   Specimen Description BLOOD BLRA  Final   Special Requests BOTTLES DRAWN AEROBIC AND ANAEROBIC [place]  Final   Culture   Final    NO GROWTH  4 DAYS Performed at [facility], [address] [place], [place] [address]    Report Status PENDING  Incomplete  C Difficile Quick Screen w PCR reflex     Status: None   Collection Time: 04/05/21  8:35 AM   Specimen: STOOL  Result Value Ref Range Status   C Diff antigen NEGATIVE NEGATIVE Final   C Diff toxin NEGATIVE NEGATIVE Final   C Diff interpretation No C. difficile detected.  Final    Comment: Performed at [facility], [address] [place], [place] [address]  Gastrointestinal Panel by PCR , Stool     Status: None   Collection Time: 04/05/21  1:44 PM   Specimen: STOOL  Result Value Ref Range Status   Campylobacter species NOT DETECTED NOT DETECTED Final   Plesimonas shigelloides NOT DETECTED NOT DETECTED Final   Salmonella species NOT DETECTED NOT DETECTED Final   Yersinia enterocolitica NOT DETECTED NOT DETECTED Final   Vibrio species NOT DETECTED NOT DETECTED Final   Vibrio cholerae NOT DETECTED NOT DETECTED Final   Enteroaggregative E coli (EAEC) NOT DETECTED NOT DETECTED Final   Enteropathogenic E coli (EPEC) NOT DETECTED NOT DETECTED Final   Enterotoxigenic E coli (ETEC) NOT DETECTED NOT DETECTED Final   Shiga like toxin producing E coli (STEC) NOT DETECTED NOT DETECTED Final   Shigella/Enteroinvasive E coli (EIEC) NOT DETECTED NOT DETECTED Final   Cryptosporidium NOT DETECTED NOT DETECTED Final   Cyclospora cayetanensis NOT DETECTED NOT DETECTED Final   Entamoeba histolytica NOT DETECTED NOT DETECTED Final   Giardia lamblia NOT DETECTED NOT DETECTED Final   Adenovirus F40/41 NOT DETECTED NOT DETECTED Final   Astrovirus NOT DETECTED NOT DETECTED Final   Norovirus GI/GII NOT DETECTED NOT DETECTED Final   Rotavirus A NOT DETECTED NOT DETECTED Final   Sapovirus (I, II, IV, and V) NOT DETECTED NOT DETECTED Final    Comment: Performed at  [address] [place], [place] [address]  Aerobic/Anaerobic Culture w Gram Stain  (surgical/deep wound)     Status: None (Preliminary result)   Collection Time: 04/05/21  5:33 PM   Specimen: Wound  Result Value Ref Range Status   Specimen Description   Final    WOUND Performed at [facility], [address] [place], [place] [address]    Special Requests   Final    PERIRECTAL ABSCESS Performed at [facility], [address] [place], [place] [address]    Gram Stain   Final    FEW WBC PRESENT, PREDOMINANTLY MONONUCLEAR ABUNDANT GRAM NEGATIVE RODS FEW GRAM POSITIVE COCCI FEW GRAM POSITIVE RODS    Culture   Final    CULTURE REINCUBATED FOR BETTER GROWTH Performed at [facility], [place] [address], [place], [place] [address]    Report Status PENDING  Incomplete         Radiology Studies: No results found.      Scheduled Meds:  sodium chloride   Intravenous Once   amoxicillin-clavulanate  1 tablet Oral Q12H   cyclobenzaprine  10 mg Oral TID   gabapentin  400 mg Oral TID   insulin aspart  0-20 Units Subcutaneous TID WC   insulin aspart  0-5 Units Subcutaneous QHS   insulin glargine-yfgn  20 Units Subcutaneous Daily   metoprolol succinate  50 mg Oral Daily   multivitamin with minerals  1 tablet Oral Daily   pantoprazole  40 mg Oral Daily   Ensure Max Protein  11 oz Oral BID   Continuous Infusions:     LOS: 4 days    Time spent: 25 min    Desma Maxim, MD [facility]   If 7PM-7AM, please contact night-coverage [email or web address] Password [facility] 04/08/2021, 12:39 PM

## 2021-04-08 NOTE — Evaluation (Signed)
Physical Therapy Evaluation Patient Details Name: Eric Joyce MRN: [id number] DOB: 1946-02-06 Today's Date: 04/08/2021  History of Present Illness  The pt is a pleasant 76 y/o male presenting to ED with generalized weakness after reports of feeling sick for the past 3 weeks and following pcp visit where pt found to have abnormal labs. Pts PMH is significant for of Htn CAD. Pt reported to have had decreased energy, fever, chills and sweats.  Clinical Impression  Pt seen with the above diagnoses. Upon start of PT evaluation pt found awake and alert in hospital bed. Pt agreeable to PT exam. Pt at start of session with frequent hiccups and reported some trunk/chest soreness, however, this was not present at end of session with pt seated in chair. During session pt was able to complete 2 STS requiring CGA and ambulate 20 ft in room with RW and CGA with no symptoms. While pt ambulated in session, he did exhibit some initial mild unsteadiness with standing.   Pt prior to hospitalization pt was very active, ambulating 3 miles most days of the week without an AD. Pt currently well below his PLOF. Pt lives alone and reports he has friends who could help him PRN. Due to pt's current functional mobility and lack of home assistance, PT recommends SNF at this time. The pt will benefit from further skilled PT to improve LE strength, balance and gait.      Recommendations for follow up therapy are one component of a multi-disciplinary discharge planning process, led by the attending physician.  Recommendations may be updated based on patient status, additional functional criteria and insurance authorization.  Follow Up Recommendations Skilled nursing-short term rehab (<3 hours/day)    Assistance Recommended at Discharge Intermittent Supervision/Assistance  Patient can return home with the following  A little help with walking and/or transfers;A little help with bathing/dressing/bathroom;Assistance with  cooking/housework;Help with stairs or ramp for entrance    Equipment Recommendations Rolling walker (2 wheels)  Recommendations for Other Services       Functional Status Assessment Patient has had a recent decline in their functional status and demonstrates the ability to make significant improvements in function in a reasonable and predictable amount of time.     Precautions / Restrictions Precautions Precautions: Fall Precaution Comments: Pt observed to have mild unsteadiness during exam      Mobility  Bed Mobility Overal bed mobility: Needs Assistance Bed Mobility: Sidelying to Sit   Sidelying to sit: Min guard       General bed mobility comments: Pt requires min guard for bed mobility/increased time    Transfers Overall transfer level: Needs assistance Equipment used: Rolling walker (2 wheels) Transfers: Sit to/from Stand Sit to Stand: Min guard           General transfer comment: Min guard due to mild initial unsteadiness with standing    Ambulation/Gait Ambulation/Gait assistance: Min guard Gait Distance (Feet): 20 Feet Assistive device: Rolling walker (2 wheels) Gait Pattern/deviations: Decreased step length - right, Decreased step length - left Gait velocity: decreased     General Gait Details: Pt reported no SOB/other symptoms with gait. He was able to amb. approx 20 ft in room with RW and min guard from PT  Stairs            Wheelchair Mobility    Modified Rankin (Stroke Patients Only)       Balance Overall balance assessment: Mild deficits observed, not formally tested  Pertinent Vitals/Pain Pain Assessment Pain Assessment: 0-10 Pain Score: 8  Pain Location: pt reports more of a soreness felt in trunk/chest from hiccups. Pt reported improvement at end of session when seated in chair, no hiccups present at end of session Pain Intervention(s): Monitored during session,  Limited activity within patient's tolerance, Repositioned    Home Living Family/patient expects to be discharged to:: Private residence Living Arrangements: Alone Available Help at Discharge: Friend(s);Available PRN/intermittently Type of Home: House Home Access: Stairs to enter Entrance Stairs-Rails: Right Entrance Stairs-Number of Steps: 2   Home Layout: One level Home Equipment: Grab bars - tub/shower;Cane - single point      Prior Function Prior Level of Function : Independent/Modified Independent             Mobility Comments: Pt reports very active prior to recent illness, was ambulating without an AD up to 3 miles most days prior to illness       Hand Dominance   Dominant Hand: Left    Extremity/Trunk Assessment                Communication   Communication: No difficulties  Cognition Arousal/Alertness: Awake/alert Behavior During Therapy: WFL for tasks assessed/performed Overall Cognitive Status: Within Functional Limits for tasks assessed                                          General Comments General comments (skin integrity, edema, etc.): mild initial unsteadiness with standing    Exercises General Exercises - Lower Extremity Ankle Circles/Pumps: 5 reps, AROM, Both Heel Slides: AROM, 5 reps, Both Straight Leg Raises: AROM, Other (comment), Both (3 reps) Other Exercises Other Exercises: Pt completed 2 STS with CGA. Pt ambulated approx 20 ft with RW, CGA   Assessment/Plan    PT Assessment Patient needs continued PT services  PT Problem List Decreased strength;Decreased activity tolerance;Decreased balance;Decreased range of motion;Decreased mobility;Decreased knowledge of use of DME;Pain       PT Treatment Interventions DME instruction;Gait training;Stair training;Functional mobility training;Therapeutic activities;Therapeutic exercise;Balance training;Neuromuscular re-education;Patient/family education;Manual  techniques;Modalities    PT Goals (Current goals can be found in the Care Plan section)       Frequency Min 2X/week     Co-evaluation               AM-PAC PT "6 Clicks" Mobility  Outcome Measure Help needed turning from your back to your side while in a flat bed without using bedrails?: A Little Help needed moving from lying on your back to sitting on the side of a flat bed without using bedrails?: A Little Help needed moving to and from a bed to a chair (including a wheelchair)?: A Little Help needed standing up from a chair using your arms (e.g., wheelchair or bedside chair)?: A Little Help needed to walk in hospital room?: A Little Help needed climbing 3-5 steps with a railing? : A Lot 6 Click Score: 17    End of Session Equipment Utilized During Treatment: Gait belt Activity Tolerance: Patient tolerated treatment well;No increased pain Patient left: in chair;with call bell/phone within reach;with chair alarm set Nurse Communication: Mobility status PT Visit Diagnosis: Unsteadiness on feet (R26.81);Other abnormalities of gait and mobility (R26.89);Muscle weakness (generalized) (M62.81);Pain Pain - Right/Left:  (trunk, pt reports hiccups causing pain)    Time: 1346-1430 PT Time Calculation (min) (ACUTE ONLY): 44 min   Charges:  PT Evaluation $PT Eval Moderate Complexity: 1 Mod PT Treatments $Gait Training: 8-22 mins        Ricard Dillon PT, DPT   Zollie Pee 04/08/2021, 3:07 PM

## 2021-04-09 DIAGNOSIS — D708 Other neutropenia: Secondary | ICD-10-CM | POA: Diagnosis not present

## 2021-04-09 DIAGNOSIS — R531 Weakness: Secondary | ICD-10-CM | POA: Diagnosis not present

## 2021-04-09 DIAGNOSIS — D72819 Decreased white blood cell count, unspecified: Secondary | ICD-10-CM

## 2021-04-09 DIAGNOSIS — I2699 Other pulmonary embolism without acute cor pulmonale: Secondary | ICD-10-CM | POA: Diagnosis not present

## 2021-04-09 DIAGNOSIS — D696 Thrombocytopenia, unspecified: Secondary | ICD-10-CM | POA: Diagnosis not present

## 2021-04-09 DIAGNOSIS — R161 Splenomegaly, not elsewhere classified: Secondary | ICD-10-CM | POA: Diagnosis not present

## 2021-04-09 LAB — CBC
HCT: 35.1 % — ABNORMAL LOW (ref 39.0–52.0)
Hemoglobin: 12.1 g/dL — ABNORMAL LOW (ref 13.0–17.0)
MCH: 30.7 pg (ref 26.0–34.0)
MCHC: 34.5 g/dL (ref 30.0–36.0)
MCV: 89.1 fL (ref 80.0–100.0)
Platelets: 28 10*3/uL — CL (ref 150–400)
RBC: 3.94 MIL/uL — ABNORMAL LOW (ref 4.22–5.81)
RDW: 14.8 % (ref 11.5–15.5)
WBC: 3.6 10*3/uL — ABNORMAL LOW (ref 4.0–10.5)
nRBC: 3.9 % — ABNORMAL HIGH (ref 0.0–0.2)

## 2021-04-09 LAB — CULTURE, BLOOD (ROUTINE X 2)
Culture: NO GROWTH
Culture: NO GROWTH

## 2021-04-09 LAB — BARTONELLA ANTIBODY PANEL
B Quintana IgM: NEGATIVE {titer}
B henselae IgG: NEGATIVE {titer}
B henselae IgM: NEGATIVE {titer}
B quintana IgG: NEGATIVE {titer}

## 2021-04-09 LAB — COMP PANEL: LEUKEMIA/LYMPHOMA: Immunophenotypic Profile: 2

## 2021-04-09 LAB — RICKETTSIAL FEVER ABS
Q Fever Phase I: NEGATIVE
Q Fever Phase II: NEGATIVE
RMSF IgG: NEGATIVE

## 2021-04-09 LAB — AEROBIC/ANAEROBIC CULTURE W GRAM STAIN (SURGICAL/DEEP WOUND)

## 2021-04-09 LAB — IMMATURE PLATELET FRACTION: Immature Platelet Fraction: 10.3 % — ABNORMAL HIGH (ref 1.2–8.6)

## 2021-04-09 LAB — EPSTEIN BARR VRS(EBV DNA BY PCR)
EBV DNA QN by PCR: 237 [IU]/mL
log10 EBV DNA Qn PCR: 2.375 {Log_IU}/mL

## 2021-04-09 LAB — GLUCOSE, CAPILLARY
Glucose-Capillary: 170 mg/dL — ABNORMAL HIGH (ref 70–99)
Glucose-Capillary: 153 mg/dL — ABNORMAL HIGH (ref 70–99)
Glucose-Capillary: 175 mg/dL — ABNORMAL HIGH (ref 70–99)
Glucose-Capillary: 196 mg/dL — ABNORMAL HIGH (ref 70–99)

## 2021-04-09 MED ORDER — DEXAMETHASONE SODIUM PHOSPHATE 10 MG/ML IJ SOLN
40.0000 mg | Freq: Every day | INTRAMUSCULAR | Status: AC
  Administered 2021-04-09: 40 mg via INTRAVENOUS
  Filled 2021-04-09: qty 4

## 2021-04-09 MED ORDER — IMMUNE GLOBULIN (HUMAN) 10 GM/100ML IV SOLN: 1.0000 g/kg | INTRAVENOUS | Status: DC

## 2021-04-09 MED ORDER — AMOXICILLIN-POT CLAVULANATE 875-125 MG PO TABS
1.0000 | ORAL_TABLET | Freq: Two times a day (BID) | ORAL | Status: AC
Start: 2021-04-09 — End: 2021-04-09
  Administered 2021-04-09: 1 via ORAL
  Filled 2021-04-09: qty 1

## 2021-04-09 MED ORDER — ACETAMINOPHEN 325 MG PO TABS
650.0000 mg | ORAL_TABLET | Freq: Every day | ORAL | Status: AC
  Administered 2021-04-09 – 2021-04-10 (×2): 650 mg via ORAL
  Filled 2021-04-09 (×2): qty 2

## 2021-04-09 MED ORDER — IMMUNE GLOBULIN (HUMAN) 10 GM/100ML IV SOLN
1.0000 g/kg | INTRAVENOUS | Status: AC
  Administered 2021-04-09 – 2021-04-10 (×2): 95 g via INTRAVENOUS
  Filled 2021-04-09 (×2): qty 950

## 2021-04-09 MED ORDER — IMMUNE GLOBULIN (HUMAN) 20 GM/200ML IV SOLN
1.0000 g/kg | INTRAVENOUS | Status: DC
  Filled 2021-04-09: qty 1000

## 2021-04-09 MED ORDER — DIPHENHYDRAMINE HCL 50 MG/ML IJ SOLN
50.0000 mg | Freq: Every day | INTRAMUSCULAR | Status: AC
  Administered 2021-04-09 – 2021-04-10 (×2): 50 mg via INTRAVENOUS
  Filled 2021-04-09 (×2): qty 1

## 2021-04-09 MED ORDER — POLYVINYL ALCOHOL 1.4 % OP SOLN
1.0000 [drp] | OPHTHALMIC | Status: DC | PRN
  Filled 2021-04-09: qty 15

## 2021-04-09 NOTE — TOC Initial Note (Signed)
Transition of Care [facility]) - Initial/Assessment Note    Patient Details  Name: Channing Yeager Gunther MRN: [id number] Date of Birth: 02-Mar-1946  [facility] CM/SW Contact:    Alberteen Sam, LCSW Phone Number: 04/09/2021, 12:49 PM  Clinical Narrative:                  Patient on isolation precautions.   CSW spoke with patient over the phone regarding PT recs of SNF.   Patient reports he is agreeable to SNF and has been to [facility] in the past. Is agreeable for CSW to send out referrals to local facilities for bed offers.   CSW has faxed out referrals pending bed offers at this time.   Expected Discharge Plan: Skilled Nursing Facility Barriers to Discharge: Continued Medical Work up   Patient Goals and CMS Choice Patient states their goals for this hospitalization and ongoing recovery are:: to go home CMS Medicare.gov Compare Post Acute Care list provided to:: Patient Choice offered to / list presented to : Patient  Expected Discharge Plan and Services Expected Discharge Plan: [place]                                              Prior Living Arrangements/Services   Lives with:: Self Patient language and need for interpreter reviewed:: Yes Do you feel safe going back to the place where you live?: No   needs short term rehab  Need for Family Participation in Patient Care: Yes (Comment) Care giver support system in place?: Yes (comment)   Criminal Activity/Legal Involvement Pertinent to Current Situation/Hospitalization: No - Comment as needed  Activities of Daily Living Home Assistive Devices/Equipment: None ADL Screening (condition at time of admission) Patient's cognitive ability adequate to safely complete daily activities?: Yes Is the patient deaf or have difficulty hearing?: No Does the patient have difficulty seeing, even when wearing glasses/contacts?: No Does the patient have difficulty concentrating, remembering, or making  decisions?: No Patient able to express need for assistance with ADLs?: Yes Does the patient have difficulty dressing or bathing?: Yes Independently performs ADLs?: Yes (appropriate for developmental age) Does the patient have difficulty walking or climbing stairs?: Yes Weakness of Legs: Both Weakness of Arms/Hands: None  Permission Sought/Granted                  Emotional Assessment Appearance:: Appears stated age Attitude/Demeanor/Rapport: Gracious Affect (typically observed): Calm Orientation: : Oriented to Self, Oriented to Place, Oriented to Situation, Oriented to  Time Alcohol / Substance Use: Not Applicable Psych Involvement: No (comment)  Admission diagnosis:  Proctitis [K62.89] Fever and chills [R50.9] Thrombocytopenia (HCC) [D69.6] Other neutropenia (HCC) [D70.8] Decreased pedal pulses [R09.89] Generalized weakness [R53.1] Patient Active Problem List   Diagnosis Date Noted   Rectal abscess    Generalized weakness 04/04/2021   Hypophosphatemia 04/04/2021   Acute pulmonary embolism ([place]) 04/04/2021   Lactic acidosis 04/04/2021   SOB (shortness of breath) 04/04/2021   Melena 04/04/2021   Other neutropenia (HCC)    Thrombocytopenia (HCC)    Abscess of anal and rectal regions    Splenomegaly    Fever and chills    Diarrhea    Abdominal distension (gaseous) 01/18/2021   Actinic keratosis 01/18/2021   Alcoholism ([place]) 01/18/2021   Basal cell carcinoma 01/18/2021   Chronic post-traumatic stress disorder 01/18/2021   COVID-19 01/18/2021  Depression 01/18/2021   Diabetes mellitus with neuropathy ([place]) 01/18/2021   Encounter for immunization 01/18/2021   History of other malignant neoplasm of skin 31/49/7026   Metabolic syndrome 37/85/8850   Nephrolithiasis 01/18/2021   Nocturia 01/18/2021   Osteoarthrosis 01/18/2021   Other urticaria 01/18/2021   Presence of intraocular lens 01/18/2021   Rosacea 01/18/2021   Seborrheic dermatitis 01/18/2021   Viral  warts 01/18/2021   Asymptomatic varicose veins 10/19/2020   CAD (coronary artery disease) 10/19/2020   Diabetes mellitus type 2, uncomplicated ([place]) 27/74/1287   Hypertensive disorder 10/19/2020   Class 1 obesity due to excess calories without serious comorbidity with body mass index (BMI) of 30.0 to 30.9 in adult 10/04/2020   Diabetic neuropathy ([place]) 04/13/2020   Hav (hallux abducto valgus), unspecified laterality 04/13/2020   Other specified postprocedural states 01/18/2014   Myocardial infarction [facility]) 06/29/2002   Hyperlipidemia 03/04/2002   Insomnia 03/04/1968   PCP:  [place]:   [place], [place] - [address] [place] [address] Phone: [phone] Fax: [phone]     Social Determinants of Health (SDOH) Interventions    Readmission Risk Interventions No flowsheet data found.

## 2021-04-09 NOTE — Progress Notes (Signed)
IVIG ordered but nursing staff unable to give per policy.  IV TEAM, Supervisor contacted, and charge nurse in ICU contacted and verified that no one is certified on this campus to give IVIG at this time.  MD(s) are aware.

## 2021-04-09 NOTE — Progress Notes (Addendum)
Hematology/Oncology Progress note [phone] [phone]     Patient Care Team: [facility] as [facility]CP - General (General Practice)   Name of the patient: Eric Joyce  [id number]  22-Apr-1945  Date of visit: 04/09/21   INTERVAL HISTORY-  Patient denies any bleeding events.  He has finished 4 days of dexamethasone 40 mg daily. Diarrhea has resolved.  Appetite is still poor.   Allergies  Allergen Reactions   Sulfamethoxazole-Trimethoprim     Patient Active Problem List   Diagnosis Date Noted   Rectal abscess    Generalized weakness 04/04/2021   Hypophosphatemia 04/04/2021   Acute pulmonary embolism (HCC) 04/04/2021   Lactic acidosis 04/04/2021   SOB (shortness of breath) 04/04/2021   Melena 04/04/2021   Other neutropenia (HCC)    Thrombocytopenia (HCC)    Abscess of anal and rectal regions    Splenomegaly    Fever and chills    Diarrhea    Abdominal distension (gaseous) 01/18/2021   Actinic keratosis 01/18/2021   Alcoholism (HCC) 01/18/2021   Basal cell carcinoma 01/18/2021   Chronic post-traumatic stress disorder 01/18/2021   COVID-19 01/18/2021   Depression 01/18/2021   Diabetes mellitus with neuropathy (HCC) 01/18/2021   Encounter for immunization 01/18/2021   History of other malignant neoplasm of skin 01/18/2021   Metabolic syndrome 01/18/2021   Nephrolithiasis 01/18/2021   Nocturia 01/18/2021   Osteoarthrosis 01/18/2021   Other urticaria 01/18/2021   Presence of intraocular lens 01/18/2021   Rosacea 01/18/2021   Seborrheic dermatitis 01/18/2021   Viral warts 01/18/2021   Asymptomatic varicose veins 10/19/2020   CAD (coronary artery disease) 10/19/2020   Diabetes mellitus type 2, uncomplicated (HCC) 10/19/2020   Hypertensive disorder 10/19/2020   Class 1 obesity due to excess calories without serious comorbidity with body mass index (BMI) of 30.0 to 30.9 in adult 10/04/2020   Diabetic neuropathy (HCC) 04/13/2020   Hav  (hallux abducto valgus), unspecified laterality 04/13/2020   Other specified postprocedural states 01/18/2014   Myocardial infarction (HCC) 06/29/2002   Hyperlipidemia 03/04/2002   Insomnia 03/04/1968     Past Medical History:  Diagnosis Date   Diabetes mellitus without complication (HCC)    Hyperlipidemia    Hypertension    Myocardial infarction (HCC)      Past Surgical History:  Procedure Laterality Date   APPENDECTOMY     CARDIAC SURGERY     2 stents   EYE SURGERY     REPLACEMENT TOTAL KNEE Right    TONSILLECTOMY AND ADENOIDECTOMY      Current Facility-Administered Medications:    0.9 %  sodium chloride infusion (Manually program via Guardrails IV Fluids), , Intravenous, Once, Manuela Schwartz, NP, Held at 04/05/21 [phone]   acetaminophen (TYLENOL) tablet 650 mg, 650 mg, Oral, Daily, Rickard Patience, MD, 650 mg at 04/09/21 1348   amoxicillin-clavulanate (AUGMENTIN) 875-125 MG per tablet 1 tablet, 1 tablet, Oral, Q12H, Wouk, Wilfred Curtis, MD   diphenhydrAMINE (BENADRYL) injection 50 mg, 50 mg, Intravenous, Daily, Rickard Patience, MD, 50 mg at 04/09/21 1348   gabapentin (NEURONTIN) capsule 400 mg, 400 mg, Oral, TID, Wouk, Wilfred Curtis, MD, 400 mg at 04/09/21 1717   Immune Globulin 10% (PRIVIGEN) IV infusion 95 g, 1 g/kg, Intravenous, Q24H, Wouk, Wilfred Curtis, MD, Last Rate: 222 mL/hr at 04/09/21 1450, Rate Change at 04/09/21 1450   insulin aspart (novoLOG) injection 0-20 Units, 0-20 Units, Subcutaneous, TID WC, Wouk, Wilfred Curtis, MD, 4 Units at 04/09/21 1717   insulin aspart (novoLOG) injection 0-5 Units,  0-5 Units, Subcutaneous, QHS, Wouk, Ailene Rud, MD, 2 Units at 04/08/21 2112   insulin glargine-yfgn [facility]) injection 30 Units, 30 Units, Subcutaneous, Daily, Wouk, Ailene Rud, MD, 30 Units at 04/09/21 1213   metoCLOPramide (REGLAN) tablet 10 mg, 10 mg, Oral, TID AC & HS, Wouk, Ailene Rud, MD, 10 mg at 04/09/21 1717   metoprolol succinate (TOPROL-XL) 24 hr tablet 50 mg, 50 mg, Oral,  Daily, Wouk, Ailene Rud, MD, 50 mg at 04/09/21 2481   multivitamin with minerals tablet 1 tablet, 1 tablet, Oral, Daily, Wouk, Ailene Rud, MD, 1 tablet at 04/09/21 0902   ondansetron [facility]) injection 4 mg, 4 mg, Intravenous, Q4H PRN, Mansy, Jan A, MD, 4 mg at 04/06/21 2349   pantoprazole (PROTONIX) EC tablet 40 mg, 40 mg, Oral, Daily, Wouk, Ailene Rud, MD, 40 mg at 04/09/21 0902   polyvinyl alcohol (LIQUIFILM TEARS) 1.4 % ophthalmic solution 1 drop, 1 drop, Both Eyes, PRN, Wouk, Ailene Rud, MD   protein supplement (ENSURE MAX) liquid, 11 oz, Oral, BID, Gwynne Edinger, MD, 11 oz at 04/09/21 1213   Physical exam:  Vitals:   04/09/21 1435 04/09/21 1450 04/09/21 1505 04/09/21 1617  BP: 115/79 120/88 118/71 124/84  Pulse: 72 79 81 83  Resp: $Remo'16 16 16 16  'WXMQe$ Temp: (!) 97.5 F (36.4 C) (!) 97.5 F (36.4 C) 97.7 F (36.5 C) 97.7 F (36.5 C)  TempSrc: Oral Oral Oral   SpO2: 96% 99% 98% 96%  Weight:      Height:       Physical Exam Constitutional:      Appearance: He is not diaphoretic.  HENT:     Head: Normocephalic and atraumatic.     Nose: Nose normal.     Mouth/Throat:     Pharynx: No oropharyngeal exudate.  Eyes:     General: No scleral icterus.    Pupils: Pupils are equal, round, and reactive to light.  Cardiovascular:     Rate and Rhythm: Normal rate and regular rhythm.     Heart sounds: No murmur heard. Pulmonary:     Effort: Pulmonary effort is normal. No respiratory distress.  Abdominal:     General: There is no distension.     Palpations: Abdomen is soft.     Tenderness: There is no abdominal tenderness.     Comments: + hiccup  Musculoskeletal:        General: Normal range of motion.     Cervical back: Normal range of motion and neck supple.  Skin:    General: Skin is warm and dry.     Findings: No erythema.     Comments: No significant petechia or bruising  Neurological:     Mental Status: He is alert and oriented to person, place, and time.      Cranial Nerves: No cranial nerve deficit.     Motor: No abnormal muscle tone.     Coordination: Coordination normal.  Psychiatric:        Mood and Affect: Affect normal.       CMP Latest Ref Rng & Units 04/08/2021  Glucose 70 - 99 mg/dL 210(H)  BUN 8 - 23 mg/dL 31(H)  Creatinine 0.61 - 1.24 mg/dL 0.66  Sodium 135 - 145 mmol/L 136  Potassium 3.5 - 5.1 mmol/L 4.4  Chloride 98 - 111 mmol/L 103  CO2 22 - 32 mmol/L 27  Calcium 8.9 - 10.3 mg/dL 8.3(L)  Total Protein 6.5 - 8.1 g/dL 5.9(L)  Total Bilirubin 0.3 - 1.2 mg/dL  1.4(H)  Alkaline Phos 38 - 126 U/L 104  AST 15 - 41 U/L 27  ALT 0 - 44 U/L 81(H)   CBC Latest Ref Rng & Units 04/09/2021  WBC 4.0 - 10.5 K/uL 3.6(L)  Hemoglobin 13.0 - 17.0 g/dL 12.1(L)  Hematocrit 39.0 - 52.0 % 35.1(L)  Platelets 150 - 400 K/uL 28(LL)    RADIOGRAPHIC STUDIES: I have personally reviewed the radiological images as listed and agreed with the findings in the report. DG Chest 2 View  Result Date: 04/04/2021 CLINICAL DATA:  Generalized weakness, fever and chills for more than 3 weeks. Black stool and copious diarrhea. EXAM: CHEST - 2 VIEW COMPARISON:  None. FINDINGS: Normal sized heart. Tortuous aorta. Clear lungs. Thoracic spine degenerative changes. IMPRESSION: No acute abnormality. Electronically Signed   By: Claudie Revering M.D.   On: 04/04/2021 17:06   CT Angio Chest Pulmonary Embolism (PE) W or WO Contrast  Result Date: 04/04/2021 CLINICAL DATA:  Weakness and fever for several weeks, initial encounter EXAM: CT ANGIOGRAPHY CHEST WITH CONTRAST TECHNIQUE: Multidetector CT imaging of the chest was performed using the standard protocol during bolus administration of intravenous contrast. Multiplanar CT image reconstructions and MIPs were obtained to evaluate the vascular anatomy. RADIATION DOSE REDUCTION: This exam was performed according to the departmental dose-optimization program which includes automated exposure control, adjustment of the mA and/or kV  according to patient size and/or use of iterative reconstruction technique. CONTRAST:  57mL OMNIPAQUE IOHEXOL 350 MG/ML SOLN COMPARISON:  None. FINDINGS: Cardiovascular: Thoracic aorta shows a normal branching pattern. No aneurysmal dilatation or dissection is noted. No cardiac enlargement is seen. Coronary calcifications are noted. The pulmonary artery shows a normal branching pattern bilaterally. Mild right lower lobe pulmonary emboli are seen posteriorly. No right heart strain is noted. Mediastinum/Nodes: Thoracic inlet is within normal limits. No sizable hilar or mediastinal adenopathy is noted. The esophagus is within normal limits. Lungs/Pleura: Lungs demonstrate mild dependent atelectatic changes. No sizable parenchymal nodules are noted. Upper Abdomen: Visualized upper abdomen is within normal limits. Musculoskeletal: Degenerative changes of the thoracic spine are noted. No acute rib abnormality is noted. Review of the MIP images confirms the above findings. IMPRESSION: Changes consistent with right lower lobe pulmonary emboli. No right heart strain is noted. Mild dependent atelectatic changes. Critical Value/emergent results were communicated the Amion at the time of interpretation on 04/04/2021 at 10:28 Pm to Dr. Florina Ou , who verbally acknowledged these results. Electronically Signed   By: Inez Catalina M.D.   On: 04/04/2021 22:32   CT ABDOMEN PELVIS W CONTRAST  Result Date: 04/04/2021 CLINICAL DATA:  Weakness, fever, chills for 3 weeks, dark stool, diarrhea EXAM: CT ABDOMEN AND PELVIS WITH CONTRAST TECHNIQUE: Multidetector CT imaging of the abdomen and pelvis was performed using the standard protocol following bolus administration of intravenous contrast. RADIATION DOSE REDUCTION: This exam was performed according to the departmental dose-optimization program which includes automated exposure control, adjustment of the mA and/or kV according to patient size and/or use of iterative reconstruction  technique. CONTRAST:  184mL OMNIPAQUE IOHEXOL 300 MG/ML  SOLN COMPARISON:  None. FINDINGS: Lower chest: No acute pleural or parenchymal lung disease. Hepatobiliary: No focal liver abnormality is seen. No gallstones, gallbladder wall thickening, or biliary dilatation. Pancreas: Unremarkable. No pancreatic ductal dilatation or surrounding inflammatory changes. Spleen: The spleen is enlarged measuring 15.5 x 15.0 x 5.7 cm. No focal parenchymal abnormalities. Adrenals/Urinary Tract: There are 2 sub 5 mm nonobstructing calculi within the lower pole left kidney. No right-sided  calculi. Small bilateral renal cortical cysts. No obstructive uropathy. Bladder is unremarkable. The adrenals are normal. Stomach/Bowel: No bowel obstruction or ileus. Scattered diverticulosis of the sigmoid colon without evidence of diverticulitis. Mild circumferential rectal wall thickening. There is a 1.7 x 1.2 cm low-attenuation region dorsal to the anal verge reference image 100/3, which could reflect a small perirectal abscess. Please correlate with physical exam findings. No inflammatory changes. Vascular/Lymphatic: Aortic atherosclerosis. No enlarged abdominal or pelvic lymph nodes. Numerous subcentimeter mesenteric and retroperitoneal lymph nodes are nonspecific. Reproductive: The prostate is mildly enlarged. Surgical clips are seen within the ventral aspect of the prostate. Other: No free fluid or free intraperitoneal gas. There is a small fat containing umbilical hernia. No bowel herniation. Musculoskeletal: No acute or destructive bony lesions. Reconstructed images demonstrate no additional findings. IMPRESSION: 1. Mild nonspecific circumferential rectal wall thickening, with possible small perirectal abscess dorsal to the anal verge. Please correlate with physical exam findings. 2. Distal colonic diverticulosis without diverticulitis. 3. Nonobstructing less than 5 mm left renal calculi. 4. Splenomegaly. 5. Small fat containing umbilical  hernia.  No bowel herniation. 6.  Aortic Atherosclerosis (ICD10-I70.0). Electronically Signed   By: Randa Ngo M.D.   On: 04/04/2021 17:50   US Venous Img Lower Bilateral (DVT)  Result Date: 04/06/2021 CLINICAL DATA:  76 year old male with history of pulmonary embolism. EXAM: BILATERAL LOWER EXTREMITY VENOUS DOPPLER ULTRASOUND TECHNIQUE: Gray-scale sonography with graded compression, as well as color Doppler and duplex ultrasound were performed to evaluate the lower extremity deep venous systems from the level of the common femoral vein and including the common femoral, femoral, profunda femoral, popliteal and calf veins including the posterior tibial, peroneal and gastrocnemius veins when visible. The superficial great saphenous vein was also interrogated. Spectral Doppler was utilized to evaluate flow at rest and with distal augmentation maneuvers in the common femoral, femoral and popliteal veins. COMPARISON:  None. FINDINGS: RIGHT LOWER EXTREMITY Common Femoral Vein: No evidence of thrombus. Normal compressibility, respiratory phasicity and response to augmentation. Saphenofemoral Junction: No evidence of thrombus. Normal compressibility and flow on color Doppler imaging. Profunda Femoral Vein: No evidence of thrombus. Normal compressibility and flow on color Doppler imaging. Femoral Vein: No evidence of thrombus. Normal compressibility, respiratory phasicity and response to augmentation. Popliteal Vein: No evidence of thrombus. Normal compressibility, respiratory phasicity and response to augmentation. Calf Veins: No evidence of thrombus. Normal compressibility and flow on color Doppler imaging. Superficial Great Saphenous Vein: Occlusive thrombus throughout the thigh and the calf to approximately 2.7 cm peripheral to the saphenofemoral junction. Other Findings:  None. LEFT LOWER EXTREMITY Common Femoral Vein: No evidence of thrombus. Normal compressibility, respiratory phasicity and response to  augmentation. Saphenofemoral Junction: No evidence of thrombus. Normal compressibility and flow on color Doppler imaging. Profunda Femoral Vein: No evidence of thrombus. Normal compressibility and flow on color Doppler imaging. Femoral Vein: No evidence of thrombus. Normal compressibility, respiratory phasicity and response to augmentation. Popliteal Vein: No evidence of thrombus. Normal compressibility, respiratory phasicity and response to augmentation. Calf Veins: No evidence of thrombus. Normal compressibility and flow on color Doppler imaging. Superficial Great Saphenous Vein: No evidence of thrombus. Normal compressibility. Other Findings:  None. IMPRESSION: 1. Superficial venous thrombosis of the right greater saphenous vein throughout the calf and thigh. No evidence of suppurative thrombophlebitis. 2. No evidence of bilateral lower extremity deep vein thrombosis. Ruthann Cancer, MD [facility] Electronically Signed   By: Ruthann Cancer M.D.   On: 04/06/2021 09:15  US ARTERIAL ABI (SCREENING LOWER EXTREMITY)  Result Date: 04/05/2021 CLINICAL DATA:  Decreased pedal pulses. EXAM: NONINVASIVE PHYSIOLOGIC VASCULAR STUDY OF BILATERAL LOWER EXTREMITIES TECHNIQUE: Evaluation of both lower extremities were performed at rest, including calculation of ankle-brachial indices with single level Doppler, pressure and pulse volume recording. COMPARISON:  None. FINDINGS: Right ABI:  1.15 Left ABI:  1.26 Right Lower Extremity:  Irregular waveforms. Left Lower Extremity:  Normal arterial waveforms at the ankle. 1.0-1.4 Normal IMPRESSION: Normal resting ankle-brachial indices bilaterally. Electronically Signed   By: Markus Daft M.D.   On: 04/05/2021 08:44   CT Maxillofacial W Contrast  Result Date: 04/04/2021 CLINICAL DATA:  Bilateral TMJ with limited movement. Mouth pain. Possible malignancy. Fever and chills 3 weeks. EXAM: CT MAXILLOFACIAL WITH CONTRAST TECHNIQUE:  Multidetector CT imaging of the maxillofacial structures was performed with intravenous contrast. Multiplanar CT image reconstructions were also generated. RADIATION DOSE REDUCTION: This exam was performed according to the departmental dose-optimization program which includes automated exposure control, adjustment of the mA and/or kV according to patient size and/or use of iterative reconstruction technique. CONTRAST:  129mL OMNIPAQUE IOHEXOL 300 MG/ML  SOLN COMPARISON:  None. FINDINGS: Osseous: Normal TMJ bilaterally. Normal alignment and no degenerative change. No lesion in the mandible. Mild periapical lucency around upper incisors bilaterally. Orbits: Negative for orbital mass or edema. Bilateral cataract extraction. Sinuses: Mucosal edema filling much of the left sphenoid sinus with bony thickening of the left sphenoid sinus. Remaining sinuses clear. Mastoid and middle ear clear bilaterally. Soft tissues: No soft tissue mass. No evidence of cellulitis or soft tissue abscess. Submandibular parotid glands normal bilaterally. Normal pharynx. Limited thyroid negative Limited intracranial: No acute abnormality. IMPRESSION: Normal TMJ bilaterally.  No dislocation or degenerative change. Small periapical lucency around upper incisors bilaterally compatible with dental infection. No evidence of soft tissue cellulitis or soft tissue abscess. No evidence of mass or malignancy Mucosal edema and bony thickening left sphenoid sinus. Electronically Signed   By: Franchot Gallo M.D.   On: 04/04/2021 17:59   US Abdomen Limited RUQ (LIVER/GB)  Result Date: 04/04/2021 CLINICAL DATA:  Elevated LFTs EXAM: ULTRASOUND ABDOMEN LIMITED RIGHT UPPER QUADRANT COMPARISON:  None. FINDINGS: Gallbladder: Moderate to large amount of echogenic sludge. No shadowing calculi visualized. No significant wall thickening or pericholecystic edema. Negative sonographic Murphy's sign. Common bile duct: Diameter: 3 mm Liver: No focal lesion identified.  Within normal limits in parenchymal echogenicity. Portal vein is patent on color Doppler imaging with normal direction of blood flow towards the liver. Other: None. IMPRESSION: Gallbladder sludge. Electronically Signed   By: Ofilia Neas M.D.   On: 04/04/2021 20:51    Assessment and plan-   #Thrombocytopenia, which is refractory to platelet transfusion, splenomegaly. Increased immature platelet fraction, indicating appropriate bone marrow response, possible peripheral blood destruction, secondary to splenomegaly and/or autoimmune or drug-induced ITP. Etiology of splenomegaly is unknown.  Peripheral flow cytometry is came back showing monoclonal lymphocytosis, less than 5000/ul, 1% myeloblasts.  This could be secondary to acute infection although her infectious work-up was negative, or due to underlying bone marrow process.  Recommend to proceed with bone marrow biopsy for further evaluation.  CRP is elevated.  ESR is elevated RPR negative adequate vitamin B12 and folate. ANA is negative.   Normal fibrinogen.  Normal haptoglobin Negative hepatitis and HIV.   Repeat smear showed no schistocytes.   EBV IgM is negative.  Positive IgG.  EBV PCR is pending. CMV PCR is negative I recommend to transfuse platelet if count  is less than 10,000. Platelet counts are trending up however still low at 28,000 after 4 days of dexamethasone. Recommend IVIG daily x2 days.-Tylenol and Benadryl as premed. I ordered 1 more day of dexamethasone.  Continue monitor CBC daily.    #Leukopenia, predominantly neutropenia unknown baseline.  Leukopenia has improved.  Total WBC of 3.6.    #Elevated LFT, negative hepatitis panel. Ultrasound abdomen showed sludge gallbladder.  Parenchymal echogenicity within normal limits.  Patent portal vein. Counts are trending down.   #Pulmonary embolism, Superficial venous thrombosis of right greater saphenous vein Xarelto calf and thigh.  No DVT bilaterally. anticoagulation is  contraindicated due to severe thrombocytopenia. Check antiphospholipid panel, results pending.  Plan to start anticoagulation once platelet count is above 50,000.   #Rectal wall thickening/rectal abscess.-Surgery evaluation.  Status post drainage.  Finish course of Augmentin.  #Hiccups, continue Flexeril.  Hiccups are better.  Thank you for allowing me to participate in the care of this patient.  Plan was discussed with Dr. Corky Sox, MD, PhD Hematology Oncology  04/09/2021

## 2021-04-09 NOTE — Progress Notes (Signed)
PROGRESS NOTE    Eric Joyce  BPZ:[id number] DOB: Oct 31, 1945 DOA: 04/04/2021 PCP: [facility]  Outpatient Specialists: cardiology    Brief Narrative:  From admission h and p Eric Joyce is a 76 y.o. male with medical history significant of Htn CAD sent by pcp for abnormal labs. Pt has been sick for 3 weeks and hpi as below.  Pt is alert, awake  and reports. He felt bad  x 3 weeks : Lack of energy, fever, chills, sweats Dr. Tamala Bari pcp. Pcp ordered labs and calls him to go hospital.  Symptoms: Pt went to [place] in [place] and back on jan 1st. [place] trip 1 week.  Jan 10th started to feel he was having a  bad cold : aches / fever/ headaches . On the 13th he felt worse : Fever / Chills / sweats /no rash or redness  so he went to Friday he took covid test and was negative. Fever would get worse in evening.  Sat / Sunday better and Monday all the symptoms come back.  Then that wednesday he went to walk in clinic [facility] in [place]. Gave him amoxicillin - took amoxicillin for two weeks but got worse. He got diarrhea - immediately after day 2 of amox. He  took amoxicillin for two weeks even with diarrhea.  stool was black,  Diarrhea was 6 times a day, pt did take pepcid complete and pepto bismal no imodium . Today he saw blood in urine.  The last time he had black stools was yesterday. Pt has never had blood transfusion.  No h/o covid.  SOB started on jan 10th.  No chest pain.   Assessment & Plan:   Principal Problem:   Generalized weakness Active Problems:   CAD (coronary artery disease)   Diabetes mellitus type 2, uncomplicated (HCC)   Alcoholism (HCC)   Other neutropenia (HCC)   Thrombocytopenia (HCC)   Abscess of anal and rectal regions   Fever and chills   Diarrhea   Hypophosphatemia   Acute pulmonary embolism (HCC)   Lactic acidosis   SOB (shortness of breath)   Melena   Rectal abscess  # Thrombocytopenia # Leukopenia #  Splenomegaly Severe. In setting of 3 weeks constitutional symptoms, diarrhea, ibuprofen use. Splenomegaly on CT. No signs cirrhosis on imaging. Renal function appears normal. Immature platelet fraction elevated indicating at least partial bone marrow response. With splenomegaly and lack of response to platelet transfusion peripheral destruction appears to be present. No clear infectious etiology. Heme and ID are involved. This may be some form of ITP. Hepatitis panel neg. HIV neg. C diff neg. Gi pathogen panel negative. Rpr neg. Cmv neg. Ana neg. Lyme neg. Adams13 not particularly low. Ebv igm neg (pcr pending). Varicella neg. Bartonella neg. Is s/p total 3 units platelets, lasts on 2/3. Flow cytometry shows monoclonal b-cell lymphocytosis of undetermined significance. Plts improved to 28 today. Today is day 4 of steroids - IVIg ordered by heme, is currently running - f/u EBV pcr - f/u erlichia - f/u rickettsia - ID and heme are following - PT advising SNF, patient agreeable, bed search underway  # Perirectal abscess Likely 2/2 recent diarrhea. S/p I and D by gen surg 2/2. Symptomatically improved after that, plan for 5 day course. Abscess culture with multiple organisms, no staph aureus. - transitioned zosyn (started 2/1) to augmentin, will stop after today's doses  # Jaw pain CT of face unremarkable. resolved  # Pulmonary embolus Pro-thrombotic state? Underlying malignancy. Anticoagulation  contraindicated given thrombocytopenia. Pvl neg for dvt but shows thrombophlebitis. Antiphospholipid panel pending  # Hiccups # GERD Hiccups likely 2/2 gerd. Underlying malignancy? Didn't respond well to flexeril. Much improved today w/ starting reglan yesterday - increased pantop to 32 - cont reglan - is already on gabapentin - consider GI consult when thrombytopenia improved  # CAD S/p multiple stents. Asymptomatic - cont home metop - hold statin given mild liver dysfunction here - hold  aspirin  # HTN Here bnp wnl - holding lisinopril  # T2DM Glucose elevated in setting of steroids. Improved some today - SSI; hold home metformin - cont semglee 30 qd, if heme stops steroids today will likely need to reduce this dose in the coming days   DVT prophylaxis: SCDs Code Status: full Family Communication: none @ bedside  Level of care: Progressive Status is: Inpatient Remains inpatient appropriate because: severity of illnes     Consultants:  ID, heme  Procedures: none  Antimicrobials:  zosyn    Subjective: Hiccups much improved. Jaw pain improved. Appetite remains poor. No bleeding.  Objective: Vitals:   04/09/21 0815 04/09/21 1154 04/09/21 1335 04/09/21 1400  BP: 120/90 117/84  115/77  Pulse: 100 86  87  Resp: 20 20  16   Temp: 98.6 F (37 C) 97.6 F (36.4 C)  97.6 F (36.4 C)  TempSrc:    Oral  SpO2: 95% 98%  97%  Weight:   92.5 kg   Height:        Intake/Output Summary (Last 24 hours) at 04/09/2021 1421 Last data filed at 04/08/2021 1706 Gross per 24 hour  Intake --  Output 600 ml  Net -600 ml   Filed Weights   04/05/21 0223 04/09/21 1335  Weight: 98.3 kg 92.5 kg    Examination:  General exam: Appears calm and comfortable  Respiratory system: Clear to auscultation. Respiratory effort normal. Cardiovascular system: S1 & S2 heard, RRR. No JVD, murmurs, rubs, gallops or clicks. No pedal edema. Gastrointestinal system: Abdomen is obese, soft and nontender. No organomegaly or masses felt. Normal bowel sounds heard. Central nervous system: Alert and oriented. No focal neurological deficits. Extremities: Symmetric 5 x 5 power. Skin: No rashes, lesions or ulcers. No bruising Psychiatry: Judgement and insight appear normal. Mood & affect appropriate.     Data Reviewed: I have personally reviewed following labs and imaging studies  CBC: Recent Labs  Lab 04/05/21 0237 04/05/21 0936 04/05/21 1350 04/06/21 0530 04/06/21 1541  04/07/21 0458 04/08/21 0453 04/09/21 0520  WBC 1.3* 1.3* 1.2* 1.1* 1.4* 1.7* 2.5* 3.6*  NEUTROABS 0.2* 0.2* 0.3* 0.3*  --  0.4*  --   --   HGB 12.3* 12.3* 12.9* 12.2* 12.2* 12.6* 12.0* 12.1*  HCT 35.8* 35.6* 37.0* 35.0* 34.5* 36.9* 35.1* 35.1*  MCV 88.0 87.9 87.5 86.6 88.7 88.7 88.9 89.1  PLT <5* 5* <5* 5* 6* 16* 19* 28*   Basic Metabolic Panel: Recent Labs  Lab 04/04/21 1505 04/04/21 1843 04/06/21 0530 04/07/21 0458 04/08/21 0453  NA 131*  --  130* 134* 136  K 3.7  --  4.0 4.0 4.4  CL 97*  --  100 100 103  CO2 24  --  22 26 27   GLUCOSE 202*  --  255* 237* 210*  BUN 22  --  19 28* 31*  CREATININE 0.73  --  0.64 0.64 0.66  CALCIUM 8.8*  --  7.9* 8.4* 8.3*  MG  --  1.7  --   --   --  PHOS  --  2.1*  --   --   --    GFR: Estimated Creatinine Clearance: 91.6 mL/min (by C-G formula based on SCr of 0.66 mg/dL). Liver Function Tests: Recent Labs  Lab 04/04/21 1603 04/05/21 1350 04/08/21 0453  AST 128* 108* 27  ALT 174* 150* 81*  ALKPHOS 133* 131* 104  BILITOT 2.1* 2.1* 1.4*  PROT 5.9* 6.2* 5.9*  ALBUMIN 2.4* 2.5* 2.4*   Recent Labs  Lab 04/04/21 1843  LIPASE 30   Recent Labs  Lab 04/05/21 0237  AMMONIA 30   Coagulation Profile: No results for input(s): INR, PROTIME in the last 168 hours. Cardiac Enzymes: Recent Labs  Lab 04/05/21 0237  CKTOTAL 20*   BNP (last 3 results) No results for input(s): PROBNP in the last 8760 hours. HbA1C: No results for input(s): HGBA1C in the last 72 hours.  CBG: Recent Labs  Lab 04/08/21 1151 04/08/21 1653 04/08/21 2044 04/09/21 0817 04/09/21 1156  GLUCAP 238* 292* 219* 170* 153*   Lipid Profile: No results for input(s): CHOL, HDL, LDLCALC, TRIG, CHOLHDL, LDLDIRECT in the last 72 hours. Thyroid Function Tests: No results for input(s): TSH, T4TOTAL, FREET4, T3FREE, THYROIDAB in the last 72 hours.  Anemia Panel: No results for input(s): VITAMINB12, FOLATE, FERRITIN, TIBC, IRON, RETICCTPCT in the last 72  hours.  Urine analysis:    Component Value Date/Time   COLORURINE AMBER (A) 04/05/2021 0955   APPEARANCEUR CLEAR 04/05/2021 [place] 02/15/2021 0939   LABSPEC 1.015 04/05/2021 0955   LABSPEC 1.017 04/27/2012 0853   PHURINE 5.5 04/05/2021 0955   GLUCOSEU NEGATIVE 04/05/2021 0955   GLUCOSEU Negative 04/27/2012 0853   HGBUR MODERATE (A) 04/05/2021 0955   BILIRUBINUR SMALL (A) 04/05/2021 0955   BILIRUBINUR Negative 02/15/2021 0939   BILIRUBINUR Negative 04/27/2012 0853   KETONESUR TRACE (A) 04/05/2021 0955   PROTEINUR 30 (A) 04/05/2021 0955   NITRITE NEGATIVE 04/05/2021 0955   LEUKOCYTESUR NEGATIVE 04/05/2021 0955   LEUKOCYTESUR Negative 04/27/2012 0853   Sepsis Labs: @LABRCNTIP (procalcitonin:4,lacticidven:4)  ) Recent Results (from the past 240 hour(s))  Resp Panel by RT-PCR (Flu A&B, Covid) Nasopharyngeal Swab     Status: None   Collection Time: 04/04/21  4:03 PM   Specimen: Nasopharyngeal Swab; Nasopharyngeal(NP) swabs in vial transport medium  Result Value Ref Range Status   SARS Coronavirus 2 by RT PCR NEGATIVE NEGATIVE Final    Comment: (NOTE) SARS-CoV-2 target nucleic acids are NOT DETECTED.  The SARS-CoV-2 RNA is generally detectable in upper respiratory specimens during the acute phase of infection. The lowest concentration of SARS-CoV-2 viral copies this assay can detect is 138 copies/mL. A negative result does not preclude SARS-Cov-2 infection and should not be used as the sole basis for treatment or other patient management decisions. A negative result may occur with  improper specimen collection/handling, submission of specimen other than nasopharyngeal swab, presence of viral mutation(s) within the areas targeted by this assay, and inadequate number of viral copies(<138 copies/mL). A negative result must be combined with clinical observations, patient history, and epidemiological information. The expected result is Negative.  Fact Sheet for  Patients:  EntrepreneurPulse.com.au  Fact Sheet for Healthcare Providers:  IncredibleEmployment.be  This test is no t yet approved or cleared by the [place] FDA and  has been authorized for detection and/or diagnosis of SARS-CoV-2 by FDA under an Emergency Use Authorization (EUA). This EUA will remain  in effect (meaning this test can be used) for the duration of the COVID-19 declaration  under Section 564(b)(1) of the Act, 21 U.S.C.section 360bbb-3(b)(1), unless the authorization is terminated  or revoked sooner.       Influenza A by PCR NEGATIVE NEGATIVE Final   Influenza B by PCR NEGATIVE NEGATIVE Final    Comment: (NOTE) The Xpert Xpress SARS-CoV-2/FLU/RSV plus assay is intended as an aid in the diagnosis of influenza from Nasopharyngeal swab specimens and should not be used as a sole basis for treatment. Nasal washings and aspirates are unacceptable for Xpert Xpress SARS-CoV-2/FLU/RSV testing.  Fact Sheet for Patients: EntrepreneurPulse.com.au  Fact Sheet for Healthcare Providers: IncredibleEmployment.be  This test is not yet approved or cleared by the [place] FDA and has been authorized for detection and/or diagnosis of SARS-CoV-2 by FDA under an Emergency Use Authorization (EUA). This EUA will remain in effect (meaning this test can be used) for the duration of the COVID-19 declaration under Section 564(b)(1) of the Act, 21 U.S.C. section 360bbb-3(b)(1), unless the authorization is terminated or revoked.  Performed at [facility], [address] [place], [place] [address]   Blood culture (routine x 2)     Status: None   Collection Time: 04/04/21  6:43 PM   Specimen: BLOOD  Result Value Ref Range Status   Specimen Description BLOOD RAC  Final   Special Requests BOTTLES DRAWN AEROBIC AND ANAEROBIC BCAV  Final   Culture   Final    NO GROWTH 5 DAYS Performed at [facility], [address] [place], [place] [address]    Report Status 04/09/2021 FINAL  Final  Blood culture (routine x 2)     Status: None   Collection Time: 04/04/21  6:43 PM   Specimen: BLOOD  Result Value Ref Range Status   Specimen Description BLOOD BLRA  Final   Special Requests BOTTLES DRAWN AEROBIC AND ANAEROBIC [place]  Final   Culture   Final    NO GROWTH 5 DAYS Performed at [facility], [address], [place], [place] [address]    Report Status 04/09/2021 FINAL  Final  C Difficile Quick Screen w PCR reflex     Status: None   Collection Time: 04/05/21  8:35 AM   Specimen: STOOL  Result Value Ref Range Status   C Diff antigen NEGATIVE NEGATIVE Final   C Diff toxin NEGATIVE NEGATIVE Final   C Diff interpretation No C. difficile detected.  Final    Comment: Performed at [facility], [address] [place], [place] [address]  Gastrointestinal Panel by PCR , Stool     Status: None   Collection Time: 04/05/21  1:44 PM   Specimen: STOOL  Result Value Ref Range Status   Campylobacter species NOT DETECTED NOT DETECTED Final   Plesimonas shigelloides NOT DETECTED NOT DETECTED Final   Salmonella species NOT DETECTED NOT DETECTED Final   Yersinia enterocolitica NOT DETECTED NOT DETECTED Final   Vibrio species NOT DETECTED NOT DETECTED Final   Vibrio cholerae NOT DETECTED NOT DETECTED Final   Enteroaggregative E coli (EAEC) NOT DETECTED NOT DETECTED Final   Enteropathogenic E coli (EPEC) NOT DETECTED NOT DETECTED Final   Enterotoxigenic E coli (ETEC) NOT DETECTED NOT DETECTED Final   Shiga like toxin producing E coli (STEC) NOT DETECTED NOT DETECTED Final   Shigella/Enteroinvasive E coli (EIEC) NOT DETECTED NOT DETECTED Final   Cryptosporidium NOT DETECTED NOT DETECTED Final   Cyclospora cayetanensis NOT DETECTED NOT DETECTED Final   Entamoeba histolytica NOT DETECTED NOT DETECTED Final   Giardia lamblia NOT  DETECTED NOT DETECTED Final   Adenovirus  F40/41 NOT DETECTED NOT DETECTED Final   Astrovirus NOT DETECTED NOT DETECTED Final   Norovirus GI/GII NOT DETECTED NOT DETECTED Final   Rotavirus A NOT DETECTED NOT DETECTED Final   Sapovirus (I, II, IV, and V) NOT DETECTED NOT DETECTED Final    Comment: Performed at [facility], [address] [place], [place] [address]  Aerobic/Anaerobic Culture w Gram Stain (surgical/deep wound)     Status: Abnormal   Collection Time: 04/05/21  5:33 PM   Specimen: Wound  Result Value Ref Range Status   Specimen Description   Final    WOUND Performed at [facility], [address], [place], [place] [address]    Special Requests   Final    PERIRECTAL ABSCESS Performed at [facility], [address] [place], [place] [address]    Gram Stain   Final    FEW WBC PRESENT, PREDOMINANTLY MONONUCLEAR ABUNDANT GRAM NEGATIVE RODS FEW GRAM POSITIVE COCCI FEW GRAM POSITIVE RODS Performed at [facility], [place] [address], [place], [place] [address]    Culture (A)  Final    MULTIPLE ORGANISMS PRESENT, NONE PREDOMINANT NO STAPHYLOCOCCUS AUREUS ISOLATED NO GROUP A STREP (S.PYOGENES) ISOLATED MIXED ANAEROBIC FLORA PRESENT.  CALL LAB IF FURTHER IID REQUIRED.    Report Status 04/09/2021 FINAL  Final  Varicella-zoster by PCR (Blood or Swab)     Status: None   Collection Time: 04/05/21  5:58 PM   Specimen: Blood  Result Value Ref Range Status   Varicella-Zoster, PCR Negative Negative Final    Comment: (NOTE) No Varicella Zoster Virus DNA detected. This test was developed and its performance characteristics determined by [facility].  It has not been cleared or approved by the Food and Drug Administration.  The FDA has determined that such clearance or approval is not necessary. Performed At: [facility] [place], [place] [id number] Rush Farmer MD YK:[id number]          Radiology Studies: No results found.      Scheduled Meds:   sodium chloride   Intravenous Once   acetaminophen  650 mg Oral Daily   amoxicillin-clavulanate  1 tablet Oral Q12H   diphenhydrAMINE  50 mg Intravenous Daily   gabapentin  400 mg Oral TID   insulin aspart  0-20 Units Subcutaneous TID WC   insulin aspart  0-5 Units Subcutaneous QHS   insulin glargine-yfgn  30 Units Subcutaneous Daily   metoCLOPramide  10 mg Oral TID AC & HS   metoprolol succinate  50 mg Oral Daily   multivitamin with minerals  1 tablet Oral Daily   pantoprazole  40 mg Oral Daily   Ensure Max Protein  11 oz Oral BID   Continuous Infusions:  IMMUNE GLOBULIN 10% (HUMAN) IV - For Fluid Restriction Only        LOS: 5 days    Time spent: 25 min    Desma Maxim, MD [facility]   If 7PM-7AM, please contact night-coverage [email or web address] Password [email or web address] 04/09/2021, 2:21 PM

## 2021-04-09 NOTE — Progress Notes (Signed)
Date of Admission:  04/04/2021      ID: Eric Joyce is a 76 y.o. male Principal Problem:   Generalized weakness Active Problems:   CAD (coronary artery disease)   Diabetes mellitus type 2, uncomplicated (HCC)   Alcoholism ([place])   Other neutropenia (HCC)   Thrombocytopenia (HCC)   Abscess of anal and rectal regions   Fever and chills   Diarrhea   Hypophosphatemia   Acute pulmonary embolism (HCC)   Lactic acidosis   SOB (shortness of breath)   Melena   Rectal abscess    Subjective: Feeling better Started having hiccups again today No pain abdomen No diarrhea Poor appetite Never had fever since hospitalization Medications:   sodium chloride   Intravenous Once   acetaminophen  650 mg Oral Daily   amoxicillin-clavulanate  1 tablet Oral Q12H   diphenhydrAMINE  50 mg Intravenous Daily   gabapentin  400 mg Oral TID   insulin aspart  0-20 Units Subcutaneous TID WC   insulin aspart  0-5 Units Subcutaneous QHS   insulin glargine-yfgn  30 Units Subcutaneous Daily   metoCLOPramide  10 mg Oral TID AC & HS   metoprolol succinate  50 mg Oral Daily   multivitamin with minerals  1 tablet Oral Daily   pantoprazole  40 mg Oral Daily   Ensure Max Protein  11 oz Oral BID    Objective: Vital signs in last 24 hours: Patient Vitals for the past 24 hrs:  BP Temp Temp src Pulse Resp SpO2 Weight  04/09/21 1617 124/84 97.7 F (36.5 C) -- 83 16 96 % --  04/09/21 1505 118/71 97.7 F (36.5 C) Oral 81 16 98 % --  04/09/21 1450 120/88 (!) 97.5 F (36.4 C) Oral 79 16 99 % --  04/09/21 1435 115/79 (!) 97.5 F (36.4 C) Oral 72 16 96 % --  04/09/21 1420 114/86 97.6 F (36.4 C) Oral 83 16 97 % --  04/09/21 1400 115/77 97.6 F (36.4 C) Oral 87 16 97 % --  04/09/21 1335 -- -- -- -- -- -- 92.5 kg  04/09/21 1154 117/84 97.6 F (36.4 C) -- 86 20 98 % --  04/09/21 0815 120/90 98.6 F (37 C) -- 100 20 95 % --  04/08/21 2045 116/85 98.1 F (36.7 C) Oral -- 17 94 % --    PHYSICAL EXAM:   General: Alert, no distress, some hiccoughs Head: Normocephalic, without obvious abnormality, atraumatic. Eyes: Conjunctivae clear, anicteric sclerae. Pupils are equal ENT Nares normal. No drainage or sinus tenderness. Lips, mucosa, and tongue normal. No Thrush Neck: Supple, symmetrical, no adenopathy, thyroid: non tender no carotid bruit and no JVD. Back: No CVA tenderness. Lungs: Clear to auscultation bilaterally. No Wheezing or Rhonchi. No rales. Heart: Regular rate and rhythm, no murmur, rub or gallop. Abdomen: Soft,  distended. Bowel sounds normal. No masses Extremities: atraumatic, no cyanosis. No edema. No clubbing Skin: No rashes or lesions. Or bruising Lymph: Cervical, supraclavicular normal. Neurologic: Grossly non-focal  Lab Results Recent Labs    04/07/21 0458 04/08/21 0453 04/09/21 0520  WBC 1.7* 2.5* 3.6*  HGB 12.6* 12.0* 12.1*  HCT 36.9* 35.1* 35.1*  NA 134* 136  --   K 4.0 4.4  --   CL 100 103  --   CO2 26 27  --   BUN 28* 31*  --   CREATININE 0.64 0.66  --    Liver Panel Recent Labs    04/08/21 0453  PROT 5.9*  ALBUMIN 2.4*  AST 27  ALT 81*  ALKPHOS 104  BILITOT 1.4*   Microbiology: Blood cultures sent on 04/04/2021 negative so far Abscess cultures so far is Proteus Studies/Results: No results found.  Superficial venous thrombosis of the right greater saphenous vein throughout the calf and thigh. No evidence of suppurative thrombophlebitis. 2. No evidence of bilateral lower extremity deep vein thrombosis.  Assessment/Plan:  76 year old male presenting with subjective fever, fatigue, weight loss, diarrhea and found to have severe thrombocytopenia and leukopenia and transaminitis.  Also has splenomegaly.  He has continuous hiccups and bilateral jaw pain. Found to have pulmonary embolism.  Etiology of the above is unclear. infection versus malignancy versus other causes  No fever since admission  HE is improving  Infectious work-up has been  sent EBV antibody IgG was > 600 IgM neg EBV DNA-P CMV DNA-neg VZV DNA neg HIV neg Hepatitis panel neg Bartonella antibodies-N RPR-neg Ehrlichia antibody_P Q fever-P Cdiff neg GI panel Neg  Impression/recommendation 76 year old male presenting with subjective fever, fatigue, weight loss, diarrhea for the past 3 weeks and found to have severe thrombocytopenia and leukopenia and transaminitis.  Also has splenomegaly.  He has continuous hiccups and bilateral jaw pain.  The latter has resolved Was also found to have pulmonary embolism and right saphenous vein superficial thrombosis.  Etiology of the above is unclear.  Could be infection versus malignancy versus drug-induced So far infectious disease work-up has been negative.  The Epstein-Barr virus IgG is high but IgM is negative hence doubt it is an active or ongoing infection.  DNA pending  No documented fever since admission. Improving platelet and wbc  Patient is followed by oncologist.  Concern for ITP.  Has received dexamethasone Getting IVIG today. Flow cytometry is indicative of involvement by CD5 positive, CD23 positive, CD20 positive, CD22 positive clonal B-cell population with less than 5000/ul, 2% of the leukocytosis, CLL/SLL phenotype. Patient was going to have bone marrow biopsy.  Diarrhea on admission-  has resolved. Clostridium differential and GI panel negative  Anal abscess- had I/D- was on zosyn- changed to augmentin by primary team  Also has right saphenous vein thrombosis-  No DVT  Discussed the management with patient. ID will follow him peripherally and will sign off once all the infectious disease available.

## 2021-04-09 NOTE — Progress Notes (Signed)
Inpatient Diabetes Program Recommendations  AACE/ADA: New Consensus Statement on Inpatient Glycemic Control (2015)  Target Ranges:  Prepandial:   less than 140 mg/dL      Peak postprandial:   less than 180 mg/dL (1-2 hours)      Critically ill patients:  140 - 180 mg/dL   Lab Results  Component Value Date   GLUCAP 170 (H) 04/09/2021   HGBA1C 6.6 (H) 04/05/2021    Review of Glycemic Control  Diabetes history: DM2 Outpatient Diabetes medications: Metformin 500 mg bid Current orders for Inpatient glycemic control: Semglee 30 units qd, Novolog 0-9 units correction tid, Decadron 40 mg qd  Inpatient Diabetes Program Recommendations:   While on steroids: -Add Novolog 5 units tid if eats 50% Secure chat sent to Dr. Si Raider.  Thank you, Nani Gasser. Ramsie Ostrander, RN, MSN, CDE  Diabetes Coordinator Inpatient Glycemic Control Team Team Pager [phone] (8am-5pm) 04/09/2021 9:25 AM

## 2021-04-09 NOTE — NC FL2 (Signed)
[place] LEVEL OF CARE SCREENING TOOL     IDENTIFICATION  Patient Name: Eric Joyce Birthdate: Mar 25, 1945 Sex: male Admission Date (Current Location): 04/04/2021  [facility] and [place] Number:  Engineering geologist and Address:  [facility], [address] [place], [place] [address]      Provider Number: [id number]  Attending Physician Name and Address:  Gwynne Edinger, MD  Relative Name and Phone Number:  Silva Bandy (sister) [phone]    Current Level of Care: Hospital Recommended Level of Care: [place] Prior Approval Number:    Date Approved/Denied:   PASRR Number: [id number]  Discharge Plan: SNF    Current Diagnoses: Patient Active Problem List   Diagnosis Date Noted   Rectal abscess    Generalized weakness 04/04/2021   Hypophosphatemia 04/04/2021   Acute pulmonary embolism ([place]) 04/04/2021   Lactic acidosis 04/04/2021   SOB (shortness of breath) 04/04/2021   Melena 04/04/2021   Other neutropenia (HCC)    Thrombocytopenia (HCC)    Abscess of anal and rectal regions    Splenomegaly    Fever and chills    Diarrhea    Abdominal distension (gaseous) 01/18/2021   Actinic keratosis 01/18/2021   Alcoholism ([place]) 01/18/2021   Basal cell carcinoma 01/18/2021   Chronic post-traumatic stress disorder 01/18/2021   COVID-19 01/18/2021   Depression 01/18/2021   Diabetes mellitus with neuropathy ([place]) 01/18/2021   Encounter for immunization 01/18/2021   History of other malignant neoplasm of skin 84/69/6295   Metabolic syndrome 28/41/3244   Nephrolithiasis 01/18/2021   Nocturia 01/18/2021   Osteoarthrosis 01/18/2021   Other urticaria 01/18/2021   Presence of intraocular lens 01/18/2021   Rosacea 01/18/2021   Seborrheic dermatitis 01/18/2021   Viral warts 01/18/2021   Asymptomatic varicose veins 10/19/2020   CAD (coronary artery disease) 10/19/2020   Diabetes mellitus type 2, uncomplicated ([place])  03/06/7251   Hypertensive disorder 10/19/2020   Class 1 obesity due to excess calories without serious comorbidity with body mass index (BMI) of 30.0 to 30.9 in adult 10/04/2020   Diabetic neuropathy ([place]) 04/13/2020   Hav (hallux abducto valgus), unspecified laterality 04/13/2020   Other specified postprocedural states 01/18/2014   Myocardial infarction ([place]) 06/29/2002   Hyperlipidemia 03/04/2002   Insomnia 03/04/1968    Orientation RESPIRATION BLADDER Height & Weight     Self, Time, Situation, Place  Normal Continent Weight: 216 lb 11.4 oz (98.3 kg) Height:  6\' 1"  (185.4 cm)  BEHAVIORAL SYMPTOMS/MOOD NEUROLOGICAL BOWEL NUTRITION STATUS      Continent Diet (see discharge summary)  AMBULATORY STATUS COMMUNICATION OF NEEDS Skin   Limited Assist Verbally Normal                       Personal Care Assistance Level of Assistance  Bathing, Feeding, Dressing, Total care Bathing Assistance: Limited assistance Feeding assistance: Independent Dressing Assistance: Limited assistance Total Care Assistance: Limited assistance   Functional Limitations Info  Sight, Hearing, Speech Sight Info: Adequate Hearing Info: Adequate Speech Info: Adequate    SPECIAL CARE FACTORS FREQUENCY  PT (By licensed PT), OT (By licensed OT)     PT Frequency: min 4x weekly OT Frequency: min 4x weekly            Contractures Contractures Info: Not present    Additional Factors Info  Code Status, Allergies Code Status Info: full Allergies Info: sulfamethoxazole-trimethoprim           Current Medications (04/09/2021):  This  is the current hospital active medication list Current Facility-Administered Medications  Medication Dose Route Frequency Provider Last Rate Last Admin   0.9 %  sodium chloride infusion (Manually program via Guardrails IV Fluids)   Intravenous Once Sharion Settler, NP   Held at 04/05/21 [phone]   acetaminophen (TYLENOL) tablet 650 mg  650 mg Oral Daily Earlie Server, MD        amoxicillin-clavulanate (AUGMENTIN) 875-125 MG per tablet 1 tablet  1 tablet Oral Q12H Wouk, Ailene Rud, MD   1 tablet at 04/09/21 0902   dexamethasone (DECADRON) injection 40 mg  40 mg Intravenous Daily Earlie Server, MD       diphenhydrAMINE (BENADRYL) injection 50 mg  50 mg Intravenous Daily Earlie Server, MD       gabapentin (NEURONTIN) capsule 400 mg  400 mg Oral TID Gwynne Edinger, MD   400 mg at 04/09/21 0902   Immune Globulin 10% (PRIVIGEN) IV infusion 100 g  1 g/kg Intravenous Q24H Earlie Server, MD   Held at 04/09/21 1201   insulin aspart (novoLOG) injection 0-20 Units  0-20 Units Subcutaneous TID WC Gwynne Edinger, MD   4 Units at 04/09/21 1212   insulin aspart (novoLOG) injection 0-5 Units  0-5 Units Subcutaneous QHS Gwynne Edinger, MD   2 Units at 04/08/21 2112   insulin glargine-yfgn [facility]) injection 30 Units  30 Units Subcutaneous Daily Gwynne Edinger, MD   30 Units at 04/09/21 1213   metoCLOPramide (REGLAN) tablet 10 mg  10 mg Oral TID AC & HS Wouk, Ailene Rud, MD   10 mg at 04/09/21 1212   metoprolol succinate (TOPROL-XL) 24 hr tablet 50 mg  50 mg Oral Daily Gwynne Edinger, MD   50 mg at 04/09/21 9163   multivitamin with minerals tablet 1 tablet  1 tablet Oral Daily Gwynne Edinger, MD   1 tablet at 04/09/21 0902   ondansetron [facility]) injection 4 mg  4 mg Intravenous Q4H PRN Mansy, Jan A, MD   4 mg at 04/06/21 2349   pantoprazole (PROTONIX) EC tablet 40 mg  40 mg Oral Daily Gwynne Edinger, MD   40 mg at 04/09/21 0902   protein supplement (ENSURE MAX) liquid  11 oz Oral BID Gwynne Edinger, MD   11 oz at 04/09/21 1213     Discharge Medications: Please see discharge summary for a list of discharge medications.  Relevant Imaging Results:  Relevant Lab Results:   Additional Information WGY:[id number]  Alberteen Sam, LCSW

## 2021-04-09 NOTE — Progress Notes (Signed)
PT Cancellation Note  Patient Details Name: Eric Joyce MRN: [id number] DOB: 11-12-1945   Cancelled Treatment:    Reason Eval/Treat Not Completed: Medical issues which prohibited therapy  Pt with IVIG infusion running upon arrival.  Session held during intervention.  Will continue tomorrow as appropriate.   Chesley Noon 04/09/2021, 3:06 PM

## 2021-04-10 ENCOUNTER — Inpatient Hospital Stay: Payer: PPO

## 2021-04-10 DIAGNOSIS — R531 Weakness: Secondary | ICD-10-CM | POA: Diagnosis not present

## 2021-04-10 LAB — CBC WITH DIFFERENTIAL/PLATELET
Abs Immature Granulocytes: 0.74 10*3/uL — ABNORMAL HIGH (ref 0.00–0.07)
Basophils Absolute: 0.1 10*3/uL (ref 0.0–0.1)
Basophils Relative: 1 %
Eosinophils Absolute: 0 10*3/uL (ref 0.0–0.5)
Eosinophils Relative: 0 %
HCT: 34.4 % — ABNORMAL LOW (ref 39.0–52.0)
Hemoglobin: 11.8 g/dL — ABNORMAL LOW (ref 13.0–17.0)
Immature Granulocytes: 9 %
Lymphocytes Relative: 19 %
Lymphs Abs: 1.5 10*3/uL (ref 0.7–4.0)
MCH: 30.7 pg (ref 26.0–34.0)
MCHC: 34.3 g/dL (ref 30.0–36.0)
MCV: 89.6 fL (ref 80.0–100.0)
Monocytes Absolute: 0.3 10*3/uL (ref 0.1–1.0)
Monocytes Relative: 4 %
Neutro Abs: 5.3 10*3/uL (ref 1.7–7.7)
Neutrophils Relative %: 67 %
Platelets: 30 10*3/uL — ABNORMAL LOW (ref 150–400)
RBC: 3.84 MIL/uL — ABNORMAL LOW (ref 4.22–5.81)
RDW: 15.6 % — ABNORMAL HIGH (ref 11.5–15.5)
Smear Review: DECREASED
WBC Morphology: ABNORMAL
WBC: 7.9 10*3/uL (ref 4.0–10.5)
nRBC: 1.4 % — ABNORMAL HIGH (ref 0.0–0.2)

## 2021-04-10 LAB — BASIC METABOLIC PANEL
Anion gap: 4 — ABNORMAL LOW (ref 5–15)
BUN: 29 mg/dL — ABNORMAL HIGH (ref 8–23)
CO2: 24 mmol/L (ref 22–32)
Calcium: 7.7 mg/dL — ABNORMAL LOW (ref 8.9–10.3)
Chloride: 102 mmol/L (ref 98–111)
Creatinine, Ser: 0.79 mg/dL (ref 0.61–1.24)
GFR, Estimated: >60 mL/min (ref >60)
Glucose, Bld: 181 mg/dL — ABNORMAL HIGH (ref 70–99)
Potassium: 4.2 mmol/L (ref 3.5–5.1)
Sodium: 130 mmol/L — ABNORMAL LOW (ref 135–145)

## 2021-04-10 LAB — CBC
HCT: 34.5 % — ABNORMAL LOW (ref 39.0–52.0)
Hemoglobin: 11.7 g/dL — ABNORMAL LOW (ref 13.0–17.0)
MCH: 31 pg (ref 26.0–34.0)
MCHC: 33.9 g/dL (ref 30.0–36.0)
MCV: 91.3 fL (ref 80.0–100.0)
Platelets: 30 10*3/uL — ABNORMAL LOW (ref 150–400)
RBC: 3.78 MIL/uL — ABNORMAL LOW (ref 4.22–5.81)
RDW: 15 % (ref 11.5–15.5)
WBC: 8 10*3/uL (ref 4.0–10.5)
nRBC: 1.4 % — ABNORMAL HIGH (ref 0.0–0.2)

## 2021-04-10 LAB — EHRLICHIA ANTIBODY PANEL
E chaffeensis (HGE) Ab, IgG: NEGATIVE
E chaffeensis (HGE) Ab, IgM: NEGATIVE
E. Chaffeensis (HME) IgM Titer: NEGATIVE
E.Chaffeensis (HME) IgG: NEGATIVE

## 2021-04-10 LAB — GLUCOSE, CAPILLARY
Glucose-Capillary: 155 mg/dL — ABNORMAL HIGH (ref 70–99)
Glucose-Capillary: 168 mg/dL — ABNORMAL HIGH (ref 70–99)
Glucose-Capillary: 133 mg/dL — ABNORMAL HIGH (ref 70–99)
Glucose-Capillary: 135 mg/dL — ABNORMAL HIGH (ref 70–99)

## 2021-04-10 MED ORDER — MIDAZOLAM HCL 2 MG/2ML IJ SOLN
INTRAMUSCULAR | Status: AC | PRN
  Administered 2021-04-10: 1 mg via INTRAVENOUS

## 2021-04-10 MED ORDER — HYDROXYZINE HCL 25 MG PO TABS: 25.0000 mg | ORAL_TABLET | Freq: Three times a day (TID) | ORAL | Status: DC | PRN

## 2021-04-10 MED ORDER — FENTANYL CITRATE (PF) 100 MCG/2ML IJ SOLN
INTRAMUSCULAR | Status: AC | PRN
Start: 2021-04-10 — End: 2021-04-10
  Administered 2021-04-10: 50 ug via INTRAVENOUS

## 2021-04-10 MED ORDER — HEPARIN SOD (PORK) LOCK FLUSH 100 UNIT/ML IV SOLN
INTRAVENOUS | Status: AC
  Filled 2021-04-10: qty 5

## 2021-04-10 MED ORDER — MIDAZOLAM HCL 2 MG/2ML IJ SOLN
INTRAMUSCULAR | Status: AC
  Filled 2021-04-10: qty 2

## 2021-04-10 MED ORDER — POLYETHYLENE GLYCOL 3350 17 G PO PACK
17.0000 g | PACK | Freq: Every day | ORAL | Status: DC
  Administered 2021-04-10: 17 g via ORAL
  Filled 2021-04-10 (×3): qty 1

## 2021-04-10 MED ORDER — FENTANYL CITRATE (PF) 100 MCG/2ML IJ SOLN
INTRAMUSCULAR | Status: AC
  Filled 2021-04-10: qty 2

## 2021-04-10 NOTE — Procedures (Signed)
Interventional Radiology Procedure Note  Procedure: CT BM ASP AND CORE BX    Complications: None  Estimated Blood Loss:  MIN  Findings: 11 G CORE AND ASP    M. TREVOR Tila Millirons, MD    

## 2021-04-10 NOTE — Progress Notes (Signed)
Nutrition Follow-up  DOCUMENTATION CODES:   Not applicable  INTERVENTION:   -Continue MVI with minerals daily -Continue Ensure Max po BID, each supplement provides 150 kcal and 30 grams of protein.     NUTRITION DIAGNOSIS:   Inadequate oral intake related to decreased appetite, diarrhea as evidenced by per patient/family report.  Ongoing  GOAL:   Patient will meet greater than or equal to 90% of their needs  Progressing   MONITOR:   PO intake, Supplement acceptance, Labs, Weight trends, I & O's  REASON FOR ASSESSMENT:   Malnutrition Screening Tool    ASSESSMENT:   76 yo male with a PMH of T2DM, HTN, previous MI, and HLD who is admitted with generalized weakness.  2/2- s/p s/p Incision and drainage of Left posterolateral perirectal abscess 2/7- s/p bone marrow aspiration and core bx  Reviewed I/O's: +612 ml x 24 hours and +993 ml since admission  UOP: 600 ml x 24 hours   Pt unavailable at time of visit. Attempted to speak with pt via call to hospital room phone, however, unable to reach.   Pt with good appetite. Noted meal completions 75%. Pt is drinking Ensure Max per his request.   Medications reviewed and include miralax.    Labs reviewed: Na: 130, CBGS: 133-196 (inpatient orders for glycemic control are 0-20 units insulin aspart TID with meals, 0-5 units insulin aspart daily at bedtime, and 30 units insulin glargine-yfgn daily).    Diet Order:   Diet Order             Diet heart healthy/carb modified Room service appropriate? Yes; Fluid consistency: Thin  Diet effective now                   EDUCATION NEEDS:   Education needs have been addressed  Skin:  Skin Assessment: Reviewed RN Assessment  Last BM:  04/09/21  Height:   Ht Readings from Last 1 Encounters:  04/05/21 6\' 1"  (1.854 m)    Weight:   Wt Readings from Last 1 Encounters:  04/09/21 92.5 kg   BMI:  Body mass index is 26.9 kg/m.  Estimated Nutritional Needs:   Kcal:   2100-2300  Protein:  110-125 grams  Fluid:  >2.1 L    Loistine Chance, RD, LDN, [place] Registered Dietitian II Certified Diabetes Care and Education Specialist Please refer to [facility] for RD and/or RD on-call/weekend/after hours pager

## 2021-04-10 NOTE — Progress Notes (Signed)
PROGRESS NOTE    Eric Joyce  XFQ:[id number] DOB: 1945/04/07 DOA: 04/04/2021 PCP: [facility]  Outpatient Specialists: cardiology    Brief Narrative:  From admission h and p Eric Joyce is a 76 y.o. male with medical history significant of Htn CAD sent by pcp for abnormal labs. Pt has been sick for 3 weeks and hpi as below.  Pt is alert, awake  and reports. He felt bad  x 3 weeks : Lack of energy, fever, chills, sweats Dr. Tamala Bari pcp. Pcp ordered labs and calls him to go hospital.  Symptoms: Pt went to [place] in [place] and back on jan 1st. [place] trip 1 week.  Jan 10th started to feel he was having a  bad cold : aches / fever/ headaches . On the 13th he felt worse : Fever / Chills / sweats /no rash or redness  so he went to Friday he took covid test and was negative. Fever would get worse in evening.  Sat / Sunday better and Monday all the symptoms come back.  Then that wednesday he went to walk in clinic [facility] in [place]. Gave him amoxicillin - took amoxicillin for two weeks but got worse. He got diarrhea - immediately after day 2 of amox. He  took amoxicillin for two weeks even with diarrhea.  stool was black,  Diarrhea was 6 times a day, pt did take pepcid complete and pepto bismal no imodium . Today he saw blood in urine.  The last time he had black stools was yesterday. Pt has never had blood transfusion.  No h/o covid.  SOB started on jan 10th.  No chest pain.   Assessment & Plan:   Principal Problem:   Generalized weakness Active Problems:   CAD (coronary artery disease)   Diabetes mellitus type 2, uncomplicated (HCC)   Alcoholism (HCC)   Other neutropenia (HCC)   Thrombocytopenia (HCC)   Abscess of anal and rectal regions   Fever and chills   Diarrhea   Hypophosphatemia   Acute pulmonary embolism (HCC)   Lactic acidosis   SOB (shortness of breath)   Melena   Rectal abscess  # Thrombocytopenia # Leukopenia #  Splenomegaly Severe. In setting of 3 weeks constitutional symptoms, diarrhea, ibuprofen use. Splenomegaly on CT. No signs cirrhosis on imaging. Renal function appears normal. Immature platelet fraction elevated indicating at least partial bone marrow response. With splenomegaly and lack of response to platelet transfusion peripheral destruction appears to be present. No clear infectious etiology. Heme and ID are involved. This may be some form of ITP. Hepatitis panel neg. HIV neg. C diff neg. Gi pathogen panel negative. Rpr neg. Cmv neg. Ana neg. Lyme neg. Adams13 not particularly low. Ebv igm neg (pcr pending). Varicella neg. Bartonella neg. Is s/p total 3 units platelets, lasts on 2/3. Flow cytometry shows monoclonal b-cell lymphocytosis of undetermined significance. Plts improved to 30 today. Treated with 4 days steroids last dose 2/6. IVIG started 2/6. Bone marrow biopsy performed today. Ebv pcr mildly elevated, ID says not clinically significant - IVIg second dose ordered by heme - f/u erlichia - f/u rickettsia - ID and heme are following - PT advising SNF, patient agreeable, bed search underway  # Perirectal abscess Likely 2/2 recent diarrhea. S/p I and D by gen surg 2/2. Symptomatically improved after that, plan for 5 day course. Abscess culture with multiple organisms, no staph aureus. - transitioned zosyn (started 2/1) to augmentin, 5 day course abx now completed  # Jaw  pain CT of face unremarkable. resolved  # Pulmonary embolus Pro-thrombotic state? Underlying malignancy. Anticoagulation contraindicated given thrombocytopenia. Pvl neg for dvt but shows thrombophlebitis. Antiphospholipid panel pending - heme advises ok to initiate anticoagulation when platelets reach 50  # Hiccups # GERD Hiccups likely 2/2 gerd. Underlying gastric/esophageal malignancy? Didn't respond well to flexeril. Now trying reglan - cont pantop - cont reglan - is already on gabapentin - consider GI consult  when thrombytopenia improved  # CAD S/p multiple stents. Asymptomatic - cont home metop - hold statin given mild liver dysfunction here - hold aspirin  # HTN Here bnp wnl - holding lisinopril  # T2DM Glucose elevated in setting of steroids. Improved some today - SSI; hold home metformin - cont semglee 30 qd for now   DVT prophylaxis: SCDs Code Status: full Family Communication: none @ bedside  Level of care: Progressive Status is: Inpatient Remains inpatient appropriate because: severity of illnes     Consultants:  ID, heme  Procedures: none  Antimicrobials:  Zosyn>augmentin   Subjective: Hiccups persist. Jaw pain improved. Appetite remains poor. No bleeding.  Objective: Vitals:   04/10/21 1048 04/10/21 1100 04/10/21 1115 04/10/21 1200  BP: (!) 137/100 132/84 124/86 126/79  Pulse: 98 81 77 83  Resp: _0 Temp:    97.6 F (36.4 C)  TempSrc:    Oral  SpO2: 100% 95% 94% 96%  Weight:      Height:        Intake/Output Summary (Last 24 hours) at 04/10/2021 1213 Last data filed at 04/10/2021 1200 Gross per 24 hour  Intake 1211.57 ml  Output 900 ml  Net 311.57 ml   Filed Weights   04/05/21 0223 04/09/21 1335  Weight: 98.3 kg 92.5 kg    Examination:  General exam: Appears calm and comfortable  Respiratory system: Clear to auscultation. Respiratory effort normal. Cardiovascular system: S1 & S2 heard, RRR. No JVD, murmurs, rubs, gallops or clicks. No pedal edema. Gastrointestinal system: Abdomen is obese, soft and nontender. No organomegaly or masses felt. Normal bowel sounds heard. Central nervous system: Alert and oriented. No focal neurological deficits. Extremities: Symmetric 5 x 5 power. Skin: No rashes, lesions or ulcers. No bruising Psychiatry: Judgement and insight appear normal. Mood & affect appropriate.     Data Reviewed: I have personally reviewed following labs and imaging studies  CBC: Recent Labs  Lab 04/05/21 0936  04/05/21 1350 04/06/21 0530 04/06/21 1541 04/07/21 0458 04/08/21 0453 04/09/21 0520 04/10/21 0506  WBC 1.3* 1.2* 1.1* 1.4* 1.7* 2.5* 3.6* 7.9   8.0  NEUTROABS 0.2* 0.3* 0.3*  --  0.4*  --   --  5.3  HGB 12.3* 12.9* 12.2* 12.2* 12.6* 12.0* 12.1* 11.8*   11.7*  HCT 35.6* 37.0* 35.0* 34.5* 36.9* 35.1* 35.1* 34.4*   34.5*  MCV 87.9 87.5 86.6 88.7 88.7 88.9 89.1 89.6   91.3  PLT 5* <5* 5* 6* 16* 19* 28* 30*   30*   Basic Metabolic Panel: Recent Labs  Lab 04/04/21 1505 04/04/21 1843 04/06/21 0530 04/07/21 0458 04/08/21 0453 04/10/21 0506  NA 131*  --  130* 134* 136 130*  K 3.7  --  4.0 4.0 4.4 4.2  CL 97*  --  100 100 103 102  CO2 24  --  _1 GLUCOSE 202*  --  255* 237* 210* 181*  BUN 22  --  19 28* 31* 29*  CREATININE 0.73  --  0.64 0.64 0.66  0.79  CALCIUM 8.8*  --  7.9* 8.4* 8.3* 7.7*  MG  --  1.7  --   --   --   --   PHOS  --  2.1*  --   --   --   --    GFR: Estimated Creatinine Clearance: 91.6 mL/min (by C-G formula based on SCr of 0.79 mg/dL). Liver Function Tests: Recent Labs  Lab 04/04/21 1603 04/05/21 1350 04/08/21 0453  AST 128* 108* 27  ALT 174* 150* 81*  ALKPHOS 133* 131* 104  BILITOT 2.1* 2.1* 1.4*  PROT 5.9* 6.2* 5.9*  ALBUMIN 2.4* 2.5* 2.4*   Recent Labs  Lab 04/04/21 1843  LIPASE 30   Recent Labs  Lab 04/05/21 0237  AMMONIA 30   Coagulation Profile: No results for input(s): INR, PROTIME in the last 168 hours. Cardiac Enzymes: Recent Labs  Lab 04/05/21 0237  CKTOTAL 20*   BNP (last 3 results) No results for input(s): PROBNP in the last 8760 hours. HbA1C: No results for input(s): HGBA1C in the last 72 hours.  CBG: Recent Labs  Lab 04/09/21 1156 04/09/21 1622 04/09/21 2052 04/10/21 0812 04/10/21 1211  GLUCAP 153* 175* 196* 155* 168*   Lipid Profile: No results for input(s): CHOL, HDL, LDLCALC, TRIG, CHOLHDL, LDLDIRECT in the last 72 hours. Thyroid Function Tests: No results for input(s): TSH, T4TOTAL, FREET4, T3FREE,  THYROIDAB in the last 72 hours.  Anemia Panel: No results for input(s): VITAMINB12, FOLATE, FERRITIN, TIBC, IRON, RETICCTPCT in the last 72 hours.  Urine analysis:    Component Value Date/Time   COLORURINE AMBER (A) 04/05/2021 0955   APPEARANCEUR CLEAR 04/05/2021 [place] 02/15/2021 0939   LABSPEC 1.015 04/05/2021 0955   LABSPEC 1.017 04/27/2012 0853   PHURINE 5.5 04/05/2021 0955   GLUCOSEU NEGATIVE 04/05/2021 0955   GLUCOSEU Negative 04/27/2012 0853   HGBUR MODERATE (A) 04/05/2021 0955   BILIRUBINUR SMALL (A) 04/05/2021 0955   BILIRUBINUR Negative 02/15/2021 0939   BILIRUBINUR Negative 04/27/2012 0853   KETONESUR TRACE (A) 04/05/2021 0955   PROTEINUR 30 (A) 04/05/2021 0955   NITRITE NEGATIVE 04/05/2021 0955   LEUKOCYTESUR NEGATIVE 04/05/2021 0955   LEUKOCYTESUR Negative 04/27/2012 0853   Sepsis Labs: _0 (procalcitonin:4,lacticidven:4)  ) Recent Results (from the past 240 hour(s))  Resp Panel by RT-PCR (Flu A&B, Covid) Nasopharyngeal Swab     Status: None   Collection Time: 04/04/21  4:03 PM   Specimen: Nasopharyngeal Swab; Nasopharyngeal(NP) swabs in vial transport medium  Result Value Ref Range Status   SARS Coronavirus 2 by RT PCR NEGATIVE NEGATIVE Final    Comment: (NOTE) SARS-CoV-2 target nucleic acids are NOT DETECTED.  The SARS-CoV-2 RNA is generally detectable in upper respiratory specimens during the acute phase of infection. The lowest concentration of SARS-CoV-2 viral copies this assay can detect is 138 copies/mL. A negative result does not preclude SARS-Cov-2 infection and should not be used as the sole basis for treatment or other patient management decisions. A negative result may occur with  improper specimen collection/handling, submission of specimen other than nasopharyngeal swab, presence of viral mutation(s) within the areas targeted by this assay, and inadequate number of viral copies(<138 copies/mL). A negative result must  be combined with clinical observations, patient history, and epidemiological information. The expected result is Negative.  Fact Sheet for Patients:  EntrepreneurPulse.com.au  Fact Sheet for Healthcare Providers:  IncredibleEmployment.be  This test is no t yet approved or cleared by the [place] and  has been authorized for  detection and/or diagnosis of SARS-CoV-2 by FDA under an Emergency Use Authorization (EUA). This EUA will remain  in effect (meaning this test can be used) for the duration of the COVID-19 declaration under Section 564(b)(1) of the Act, 21 U.S.C.section 360bbb-3(b)(1), unless the authorization is terminated  or revoked sooner.       Influenza A by PCR NEGATIVE NEGATIVE Final   Influenza B by PCR NEGATIVE NEGATIVE Final    Comment: (NOTE) The Xpert Xpress SARS-CoV-2/FLU/RSV plus assay is intended as an aid in the diagnosis of influenza from Nasopharyngeal swab specimens and should not be used as a sole basis for treatment. Nasal washings and aspirates are unacceptable for Xpert Xpress SARS-CoV-2/FLU/RSV testing.  Fact Sheet for Patients: EntrepreneurPulse.com.au  Fact Sheet for Healthcare Providers: IncredibleEmployment.be  This test is not yet approved or cleared by the [place] FDA and has been authorized for detection and/or diagnosis of SARS-CoV-2 by FDA under an Emergency Use Authorization (EUA). This EUA will remain in effect (meaning this test can be used) for the duration of the COVID-19 declaration under Section 564(b)(1) of the Act, 21 U.S.C. section 360bbb-3(b)(1), unless the authorization is terminated or revoked.  Performed at [facility], [address] [place], [place] [address]   Blood culture (routine x 2)     Status: None   Collection Time: 04/04/21  6:43 PM   Specimen: BLOOD  Result Value Ref Range Status   Specimen Description BLOOD  RAC  Final   Special Requests BOTTLES DRAWN AEROBIC AND ANAEROBIC BCAV  Final   Culture   Final    NO GROWTH 5 DAYS Performed at [facility], [address], [place], [place] [address]    Report Status 04/09/2021 FINAL  Final  Blood culture (routine x 2)     Status: None   Collection Time: 04/04/21  6:43 PM   Specimen: BLOOD  Result Value Ref Range Status   Specimen Description BLOOD BLRA  Final   Special Requests BOTTLES DRAWN AEROBIC AND ANAEROBIC [place]  Final   Culture   Final    NO GROWTH 5 DAYS Performed at [facility], [address], [place], [place] [address]    Report Status 04/09/2021 FINAL  Final  C Difficile Quick Screen w PCR reflex     Status: None   Collection Time: 04/05/21  8:35 AM   Specimen: STOOL  Result Value Ref Range Status   C Diff antigen NEGATIVE NEGATIVE Final   C Diff toxin NEGATIVE NEGATIVE Final   C Diff interpretation No C. difficile detected.  Final    Comment: Performed at [facility], [address] [place], [place] [address]  Gastrointestinal Panel by PCR , Stool     Status: None   Collection Time: 04/05/21  1:44 PM   Specimen: STOOL  Result Value Ref Range Status   Campylobacter species NOT DETECTED NOT DETECTED Final   Plesimonas shigelloides NOT DETECTED NOT DETECTED Final   Salmonella species NOT DETECTED NOT DETECTED Final   Yersinia enterocolitica NOT DETECTED NOT DETECTED Final   Vibrio species NOT DETECTED NOT DETECTED Final   Vibrio cholerae NOT DETECTED NOT DETECTED Final   Enteroaggregative E coli (EAEC) NOT DETECTED NOT DETECTED Final   Enteropathogenic E coli (EPEC) NOT DETECTED NOT DETECTED Final   Enterotoxigenic E coli (ETEC) NOT DETECTED NOT DETECTED Final   Shiga like toxin producing E coli (STEC) NOT DETECTED NOT DETECTED Final   Shigella/Enteroinvasive E coli (EIEC) NOT DETECTED NOT  DETECTED Final   Cryptosporidium NOT DETECTED NOT DETECTED Final   Cyclospora cayetanensis NOT DETECTED NOT  DETECTED Final   Entamoeba histolytica NOT DETECTED NOT DETECTED Final   Giardia lamblia NOT DETECTED NOT DETECTED Final   Adenovirus F40/41 NOT DETECTED NOT DETECTED Final   Astrovirus NOT DETECTED NOT DETECTED Final   Norovirus GI/GII NOT DETECTED NOT DETECTED Final   Rotavirus A NOT DETECTED NOT DETECTED Final   Sapovirus (I, II, IV, and V) NOT DETECTED NOT DETECTED Final    Comment: Performed at [facility], [address] [place], [place] [address]  Aerobic/Anaerobic Culture w Gram Stain (surgical/deep wound)     Status: Abnormal   Collection Time: 04/05/21  5:33 PM   Specimen: Wound  Result Value Ref Range Status   Specimen Description   Final    WOUND Performed at [facility], [address], [place], [place] [address]    Special Requests   Final    PERIRECTAL ABSCESS Performed at [facility], [address] [place], [place] [address]    Gram Stain   Final    FEW WBC PRESENT, PREDOMINANTLY MONONUCLEAR ABUNDANT GRAM NEGATIVE RODS FEW GRAM POSITIVE COCCI FEW GRAM POSITIVE RODS Performed at [facility], [place] [address] [place], [place] [address]    Culture (A)  Final    MULTIPLE ORGANISMS PRESENT, NONE PREDOMINANT NO STAPHYLOCOCCUS AUREUS ISOLATED NO GROUP A STREP (S.PYOGENES) ISOLATED MIXED ANAEROBIC FLORA PRESENT.  CALL LAB IF FURTHER IID REQUIRED.    Report Status 04/09/2021 FINAL  Final  Varicella-zoster by PCR (Blood or Swab)     Status: None   Collection Time: 04/05/21  5:58 PM   Specimen: Blood  Result Value Ref Range Status   Varicella-Zoster, PCR Negative Negative Final    Comment: (NOTE) No Varicella Zoster Virus DNA detected. This test was developed and its performance characteristics determined by [facility].  It has not been cleared or approved by the Food and Drug Administration.  The FDA has determined that such clearance or approval is not necessary. Performed At: [facility] [place], [place] [id number] Rush Farmer MD LI:[id number]          Radiology Studies: CT BONE MARROW BIOPSY & ASPIRATION  Result Date: 04/10/2021 INDICATION: Thrombocytopenia EXAM: CT GUIDED RIGHT ILIAC BONE MARROW ASPIRATION AND CORE BIOPSY Date:  04/10/2021 04/10/2021 11:00 am Radiologist:  M. Daryll Brod, MD Guidance:  CT FLUOROSCOPY: Fluoroscopy Time: None. MEDICATIONS: 1% lidocaine local ANESTHESIA/SEDATION: 1.0 mg IV Versed; 100 mcg IV Fentanyl Moderate Sedation Time:  10 minute The patient was continuously monitored during the procedure by the interventional radiology nurse under my direct supervision. CONTRAST:  None. COMPLICATIONS: None PROCEDURE: Informed consent was obtained from the patient following explanation of the procedure, risks, benefits and alternatives. The patient understands, agrees and consents for the procedure. All questions were addressed. A time out was performed. The patient was positioned prone and non-contrast localization CT was performed of the pelvis to demonstrate the iliac marrow spaces. Maximal barrier sterile technique utilized including caps, mask, sterile gowns, sterile gloves, large sterile drape, hand hygiene, and Betadine prep. Under sterile conditions and local anesthesia, an 11 gauge coaxial bone biopsy needle was advanced into the right iliac marrow space. Needle position was confirmed with CT imaging. Initially, bone marrow aspiration was performed. Next, the 11 gauge outer cannula was utilized to obtain a right iliac bone marrow core biopsy. Needle was removed. Hemostasis was obtained with compression. The  patient tolerated the procedure well. Samples were prepared with the cytotechnologist. No immediate complications. IMPRESSION: CT guided right iliac bone marrow aspiration and core biopsy. Electronically Signed   By: Jerilynn Mages.  Shick M.D.   On: 04/10/2021 11:07        Scheduled Meds:  sodium chloride   Intravenous Once   acetaminophen  650 mg Oral Daily    diphenhydrAMINE  50 mg Intravenous Daily   gabapentin  400 mg Oral TID   insulin aspart  0-20 Units Subcutaneous TID WC   insulin aspart  0-5 Units Subcutaneous QHS   insulin glargine-yfgn  30 Units Subcutaneous Daily   metoCLOPramide  10 mg Oral TID AC & HS   metoprolol succinate  50 mg Oral Daily   multivitamin with minerals  1 tablet Oral Daily   pantoprazole  40 mg Oral Daily   Ensure Max Protein  11 oz Oral BID   Continuous Infusions:  IMMUNE GLOBULIN 10% (HUMAN) IV - For Fluid Restriction Only 222 mL/hr at 04/09/21 1450      LOS: 6 days    Time spent: 25 min    Desma Maxim, MD [facility]   If 7PM-7AM, please contact night-coverage [email or web address] Password [facility] 04/10/2021, 12:13 PM

## 2021-04-10 NOTE — Progress Notes (Signed)
Per pharmacy, keep IVIG infusion at 240mg /kg/hr.

## 2021-04-10 NOTE — Progress Notes (Signed)
Physical Therapy Treatment Patient Details Name: Eric Joyce MRN: [id number] DOB: 1945-06-17 Today's Date: 04/10/2021   History of Present Illness 76 y/o male here with generalized weakness after reports of feeling sick for the past few weeks and following pcp visit where pt found to have abnormal labs. Pts PMH is significant for of Htn CAD. Pt reported to have had decreased energy, fever, chills and sweats.    PT Comments    Pt initially reporting that he is too tired and needs to sleep but with gentle encouragement he acknowledges the need to be active (cleared for normal activity by MD prior to session).  He ultimately was able to ambulate ~300 ft, but was unsteady and showed poor safety awareness t/o the effort and the session in general.  Pt had very inconsistent steppage/stagger stepping, poor posture (nearly falling forward into walker multiple times), and poor general safety awareness.  He was unable to consistently keep himself inside the walker despite nearly constant cuing.  Pt's vitals remained relatively stable t/o the effort and he did well with supine exercises, however he is very far from his baseline and being safe to manage at home. Short term rehab continues to be the only viable and safe option at this time.   Recommendations for follow up therapy are one component of a multi-disciplinary discharge planning process, led by the attending physician.  Recommendations may be updated based on patient status, additional functional criteria and insurance authorization.  Follow Up Recommendations  Skilled nursing-short term rehab (<3 hours/day)     Assistance Recommended at Discharge Frequent or constant Supervision/Assistance  Patient can return home with the following A little help with bathing/dressing/bathroom;A little help with walking and/or transfers;Assistance with cooking/housework;Assist for transportation;Help with stairs or ramp for entrance   Equipment  Recommendations  Rolling walker (2 wheels)    Recommendations for Other Services       Precautions / Restrictions Precautions Precautions: Fall Restrictions Weight Bearing Restrictions: No     Mobility  Bed Mobility Overal bed mobility: Needs Assistance Bed Mobility: Supine to Sit, Sit to Supine     Supine to sit: Min guard Sit to supine: Min assist   General bed mobility comments: Pt able to get himself to EOB, but did not get L LE to EOB and consistently maintained overly forward flexed posture that he temorarily would correct with cuing and then quickly reassume face almost between legs slumping.  Able to get LEs back up into bed post ambulation but needed assist scooting up in bed.    Transfers Overall transfer level: Needs assistance Equipment used: Rolling walker (2 wheels) Transfers: Sit to/from Stand Sit to Stand: Min assist           General transfer comment: Again pt with strange posturing for someone who ostensiby had been able to exercise and walk miles QD prior to admission.  Light assist to initiate rise to standing and to insure appropriate posturing and use of walker.  Pt with definite unsteadiness on initial standing needing direct assist to insure safety.    Ambulation/Gait Ambulation/Gait assistance: Min assist Gait Distance (Feet): 300 Feet Assistive device: Rolling walker (2 wheels)         General Gait Details: Once up and going pt was eager to see how much he could do.  HR stayed relatively stable t/o the effort (getting to low 100s) but he did start to have some subjective fatigue with increased distance/activity. Pt showed poor safety awareness, both with gradually  increasing forward lean, as well as very poor intuitive use of the walker often getting too far from or too far outside of it and on multiple occasions needing light assist to correct before nearly falling as VCs often did not seem to have timely responses to correct overt safety  concerns.  Pt with inconsistent cadence with regular stagger stepping and again poor ability to make appropriate adjustments despite plenty of cuing.   Stairs             Wheelchair Mobility    Modified Rankin (Stroke Patients Only)       Balance Overall balance assessment: Needs assistance Sitting-balance support: No upper extremity supported Sitting balance-Leahy Scale: Fair Sitting balance - Comments: Pt with very poor self selected posture (that he could briefly correct with cuing) but was excessively slumped much of the time   Standing balance support: Bilateral upper extremity supported Standing balance-Leahy Scale: Poor Standing balance comment: Pt with poor awareness of appropriate walker use (As well clearly being very heavily reliant on it) as he consistently either got it too far ahead of him or, especially on turns and even after much cuing pre and during, he would get far outside walker and repeatedly had stagger stepping and need for light direct assist to insure upright posture                            Cognition Arousal/Alertness: Awake/alert Behavior During Therapy: WFL for tasks assessed/performed Overall Cognitive Status: Difficult to assess                                 General Comments: Pt with some impulsivity as well as difficulty following some instructions despite consistent cuing.  Pt pleasant and willing to work with PT  but did have moments of lack of intuitive processing and poor safety awareness.        Exercises General Exercises - Lower Extremity Ankle Circles/Pumps: AROM, 10 reps Heel Slides: Strengthening, 10 reps (with resisted leg ext) Hip ABduction/ADduction: Strengthening, 10 reps Straight Leg Raises: Strengthening, 10 reps (with light resist at apex)    General Comments General comments (skin integrity, edema, etc.): Pt ostensibly was very active and independent prior to hospitalization but today showed  very poor safety and is clearly unsafe to attempt managing at home alone.  Very poor safety awareness, as well as some inconsistent tremoring that he reports as new onset this hospitalization.      Pertinent Vitals/Pain Pain Assessment Pain Assessment: No/denies pain    Home Living                          Prior Function            PT Goals (current goals can now be found in the care plan section) Progress towards PT goals: Progressing toward goals    Frequency    Min 2X/week      PT Plan Current plan remains appropriate    Co-evaluation              AM-PAC PT "6 Clicks" Mobility   Outcome Measure  Help needed turning from your back to your side while in a flat bed without using bedrails?: A Little Help needed moving from lying on your back to sitting on the side of a flat bed without using bedrails?: A  Little Help needed moving to and from a bed to a chair (including a wheelchair)?: A Little Help needed standing up from a chair using your arms (e.g., wheelchair or bedside chair)?: A Little Help needed to walk in hospital room?: A Little Help needed climbing 3-5 steps with a railing? : A Lot 6 Click Score: 17    End of Session Equipment Utilized During Treatment: Gait belt Activity Tolerance: Patient tolerated treatment well Patient left: with bed alarm set;with call bell/phone within reach;with nursing/sitter in room Nurse Communication: Mobility status PT Visit Diagnosis: Unsteadiness on feet (R26.81);Other abnormalities of gait and mobility (R26.89);Muscle weakness (generalized) (M62.81);Pain     Time: 5681-2751 PT Time Calculation (min) (ACUTE ONLY): 27 min  Charges:  $Gait Training: 8-22 mins $Therapeutic Exercise: 8-22 mins                     Kreg Shropshire, DPT 04/10/2021, 4:30 PM

## 2021-04-10 NOTE — Consult Note (Signed)
Chief Complaint: Patient was seen in consultation today for  Chief Complaint  Patient presents with   Weakness    Referring Physician(s): Dr. Tasia Catchings  Supervising Physician: Daryll Brod  Patient Status: [place] - In-pt  History of Present Illness: Eric Joyce is a 76 y.o. male with a medical history significant for DM, HTN and myocardial  infarction. He was sent to the ED by his PCP for evaluation of abnormal labs, weakness and shortness of breath. He was found to have a right lower lobe pulmonary embolism, thrombocytopenia and splenomegaly. He was not started on anticoagulation due to thrombocytopenia.   Interventional Radiology has been asked to evaluate this patient for an image-guided bone marrow biopsy and aspiration for further work up.   Past Medical History:  Diagnosis Date   Diabetes mellitus without complication ([place])    Hyperlipidemia    Hypertension    Myocardial infarction [facility])     Past Surgical History:  Procedure Laterality Date   APPENDECTOMY     CARDIAC SURGERY     2 stents   EYE SURGERY     REPLACEMENT TOTAL KNEE Right    TONSILLECTOMY AND ADENOIDECTOMY      Allergies: Sulfamethoxazole-trimethoprim  Medications: Prior to Admission medications   Medication Sig Start Date End Date Taking? Authorizing Provider  amoxicillin-clavulanate (AUGMENTIN) 875-125 MG tablet Take 1 tablet by mouth 2 (two) times daily. 03/28/21  Yes [provider]  aspirin EC 81 MG tablet Take 81 mg by mouth daily.   Yes [provider]  atorvastatin (LIPITOR) 40 MG tablet Take 40 mg by mouth daily. 03/27/17  Yes [provider]  Cholecalciferol 125 MCG (5000 UT) TABS Take 5,000 Units by mouth daily.   Yes [provider]  Coenzyme Q10 100 MG capsule Take 100 mg by mouth daily.   Yes [provider]  gabapentin (NEURONTIN) 400 MG capsule Take 400 mg by mouth 3 (three) times daily.   Yes [provider]  lisinopril  (PRINIVIL,ZESTRIL) 10 MG tablet Take 10 mg by mouth daily. 03/27/17  Yes [provider]  melatonin 3 MG TABS tablet Take 6 mg by mouth at bedtime.   Yes [provider]  metFORMIN (GLUCOPHAGE) 500 MG tablet Take 500 mg by mouth 2 (two) times daily with a meal. 03/27/17  Yes [provider]  metoprolol succinate (TOPROL-XL) 50 MG 24 hr tablet Take 50 mg by mouth daily. 03/27/17  Yes [provider]  oxybutynin (DITROPAN-XL) 10 MG 24 hr tablet Take 1 tablet (10 mg total) by mouth daily. 02/15/21  Yes Billey Co, MD  vitamin B-12 (CYANOCOBALAMIN) 1000 MCG tablet Take 1,000 mcg by mouth daily.   Yes [provider]  DOCOSAHEXAENOIC ACID PO Take by mouth.    [provider]     Family History  Problem Relation Age of Onset   Lung cancer Mother    Heart disease Father    Diabetes Paternal Uncle    Diabetes Paternal Grandmother     Social History   Socioeconomic History   Marital status: Single    Spouse name: Not on file   Number of children: Not on file   Years of education: Not on file   Highest education level: Not on file  Occupational History   Not on file  Tobacco Use   Smoking status: Former    Types: Cigarettes   Smokeless tobacco: Never  Substance and Sexual Activity   Alcohol use: Not Currently  Drug use: Never   Sexual activity: Not on file  Other Topics Concern   Not on file  Social History Narrative   Not on file   Social Determinants of Health   Financial Resource Strain: Not on file  Food Insecurity: Not on file  Transportation Needs: Not on file  Physical Activity: Not on file  Stress: Not on file  Social Connections: Not on file    Review of Systems: A 12 point ROS discussed and pertinent positives are indicated in the HPI above.  All other systems are negative.  Review of Systems  Constitutional:  Positive for appetite change and fatigue.  Respiratory:  Positive for shortness of breath.  Negative for cough.   Cardiovascular:  Negative for chest pain and leg swelling.  Gastrointestinal:  Negative for abdominal pain, diarrhea, nausea and vomiting.  Neurological:  Negative for headaches.   Vital Signs: BP 120/88 (BP Location: Left Arm)    Pulse 73    Temp 97.7 F (36.5 C)    Resp 18    Ht $R'6\' 1"'MU$  (1.854 m)    Wt 203 lb 14.8 oz (92.5 kg)    SpO2 95%    BMI 26.90 kg/m   Physical Exam Constitutional:      General: He is not in acute distress.    Appearance: He is ill-appearing.  HENT:     Mouth/Throat:     Mouth: Mucous membranes are moist.     Pharynx: Oropharynx is clear.  Cardiovascular:     Rate and Rhythm: Normal rate and regular rhythm.     Pulses: Normal pulses.     Heart sounds: Normal heart sounds.  Pulmonary:     Effort: Pulmonary effort is normal.     Breath sounds: Normal breath sounds.  Abdominal:     General: Bowel sounds are normal.     Palpations: Abdomen is soft.  Skin:    General: Skin is warm and dry.  Neurological:     Mental Status: He is alert and oriented to person, place, and time.    Imaging: DG Chest 2 View  Result Date: 04/04/2021 CLINICAL DATA:  Generalized weakness, fever and chills for more than 3 weeks. Black stool and copious diarrhea. EXAM: CHEST - 2 VIEW COMPARISON:  None. FINDINGS: Normal sized heart. Tortuous aorta. Clear lungs. Thoracic spine degenerative changes. IMPRESSION: No acute abnormality. Electronically Signed   By: Claudie Revering M.D.   On: 04/04/2021 17:06   CT Angio Chest Pulmonary Embolism (PE) W or WO Contrast  Result Date: 04/04/2021 CLINICAL DATA:  Weakness and fever for several weeks, initial encounter EXAM: CT ANGIOGRAPHY CHEST WITH CONTRAST TECHNIQUE: Multidetector CT imaging of the chest was performed using the standard protocol during bolus administration of intravenous contrast. Multiplanar CT image reconstructions and MIPs were obtained to evaluate the vascular anatomy. RADIATION DOSE REDUCTION: This exam was  performed according to the departmental dose-optimization program which includes automated exposure control, adjustment of the mA and/or kV according to patient size and/or use of iterative reconstruction technique. CONTRAST:  78mL OMNIPAQUE IOHEXOL 350 MG/ML SOLN COMPARISON:  None. FINDINGS: Cardiovascular: Thoracic aorta shows a normal branching pattern. No aneurysmal dilatation or dissection is noted. No cardiac enlargement is seen. Coronary calcifications are noted. The pulmonary artery shows a normal branching pattern bilaterally. Mild right lower lobe pulmonary emboli are seen posteriorly. No right heart strain is noted. Mediastinum/Nodes: Thoracic inlet is within normal limits. No sizable hilar or mediastinal adenopathy is noted. The esophagus is within  normal limits. Lungs/Pleura: Lungs demonstrate mild dependent atelectatic changes. No sizable parenchymal nodules are noted. Upper Abdomen: Visualized upper abdomen is within normal limits. Musculoskeletal: Degenerative changes of the thoracic spine are noted. No acute rib abnormality is noted. Review of the MIP images confirms the above findings. IMPRESSION: Changes consistent with right lower lobe pulmonary emboli. No right heart strain is noted. Mild dependent atelectatic changes. Critical Value/emergent results were communicated the Amion at the time of interpretation on 04/04/2021 at 10:28 Pm to Dr. Florina Ou , who verbally acknowledged these results. Electronically Signed   By: Inez Catalina M.D.   On: 04/04/2021 22:32   CT ABDOMEN PELVIS W CONTRAST  Result Date: 04/04/2021 CLINICAL DATA:  Weakness, fever, chills for 3 weeks, dark stool, diarrhea EXAM: CT ABDOMEN AND PELVIS WITH CONTRAST TECHNIQUE: Multidetector CT imaging of the abdomen and pelvis was performed using the standard protocol following bolus administration of intravenous contrast. RADIATION DOSE REDUCTION: This exam was performed according to the departmental dose-optimization program  which includes automated exposure control, adjustment of the mA and/or kV according to patient size and/or use of iterative reconstruction technique. CONTRAST:  127mL OMNIPAQUE IOHEXOL 300 MG/ML  SOLN COMPARISON:  None. FINDINGS: Lower chest: No acute pleural or parenchymal lung disease. Hepatobiliary: No focal liver abnormality is seen. No gallstones, gallbladder wall thickening, or biliary dilatation. Pancreas: Unremarkable. No pancreatic ductal dilatation or surrounding inflammatory changes. Spleen: The spleen is enlarged measuring 15.5 x 15.0 x 5.7 cm. No focal parenchymal abnormalities. Adrenals/Urinary Tract: There are 2 sub 5 mm nonobstructing calculi within the lower pole left kidney. No right-sided calculi. Small bilateral renal cortical cysts. No obstructive uropathy. Bladder is unremarkable. The adrenals are normal. Stomach/Bowel: No bowel obstruction or ileus. Scattered diverticulosis of the sigmoid colon without evidence of diverticulitis. Mild circumferential rectal wall thickening. There is a 1.7 x 1.2 cm low-attenuation region dorsal to the anal verge reference image 100/3, which could reflect a small perirectal abscess. Please correlate with physical exam findings. No inflammatory changes. Vascular/Lymphatic: Aortic atherosclerosis. No enlarged abdominal or pelvic lymph nodes. Numerous subcentimeter mesenteric and retroperitoneal lymph nodes are nonspecific. Reproductive: The prostate is mildly enlarged. Surgical clips are seen within the ventral aspect of the prostate. Other: No free fluid or free intraperitoneal gas. There is a small fat containing umbilical hernia. No bowel herniation. Musculoskeletal: No acute or destructive bony lesions. Reconstructed images demonstrate no additional findings. IMPRESSION: 1. Mild nonspecific circumferential rectal wall thickening, with possible small perirectal abscess dorsal to the anal verge. Please correlate with physical exam findings. 2. Distal colonic  diverticulosis without diverticulitis. 3. Nonobstructing less than 5 mm left renal calculi. 4. Splenomegaly. 5. Small fat containing umbilical hernia.  No bowel herniation. 6.  Aortic Atherosclerosis (ICD10-I70.0). Electronically Signed   By: Randa Ngo M.D.   On: 04/04/2021 17:50   US Venous Img Lower Bilateral (DVT)  Result Date: 04/06/2021 CLINICAL DATA:  76 year old male with history of pulmonary embolism. EXAM: BILATERAL LOWER EXTREMITY VENOUS DOPPLER ULTRASOUND TECHNIQUE: Gray-scale sonography with graded compression, as well as color Doppler and duplex ultrasound were performed to evaluate the lower extremity deep venous systems from the level of the common femoral vein and including the common femoral, femoral, profunda femoral, popliteal and calf veins including the posterior tibial, peroneal and gastrocnemius veins when visible. The superficial great saphenous vein was also interrogated. Spectral Doppler was utilized to evaluate flow at rest and with distal augmentation maneuvers in the common femoral, femoral and popliteal veins. COMPARISON:  None.  FINDINGS: RIGHT LOWER EXTREMITY Common Femoral Vein: No evidence of thrombus. Normal compressibility, respiratory phasicity and response to augmentation. Saphenofemoral Junction: No evidence of thrombus. Normal compressibility and flow on color Doppler imaging. Profunda Femoral Vein: No evidence of thrombus. Normal compressibility and flow on color Doppler imaging. Femoral Vein: No evidence of thrombus. Normal compressibility, respiratory phasicity and response to augmentation. Popliteal Vein: No evidence of thrombus. Normal compressibility, respiratory phasicity and response to augmentation. Calf Veins: No evidence of thrombus. Normal compressibility and flow on color Doppler imaging. Superficial Great Saphenous Vein: Occlusive thrombus throughout the thigh and the calf to approximately 2.7 cm peripheral to the saphenofemoral junction. Other Findings:   None. LEFT LOWER EXTREMITY Common Femoral Vein: No evidence of thrombus. Normal compressibility, respiratory phasicity and response to augmentation. Saphenofemoral Junction: No evidence of thrombus. Normal compressibility and flow on color Doppler imaging. Profunda Femoral Vein: No evidence of thrombus. Normal compressibility and flow on color Doppler imaging. Femoral Vein: No evidence of thrombus. Normal compressibility, respiratory phasicity and response to augmentation. Popliteal Vein: No evidence of thrombus. Normal compressibility, respiratory phasicity and response to augmentation. Calf Veins: No evidence of thrombus. Normal compressibility and flow on color Doppler imaging. Superficial Great Saphenous Vein: No evidence of thrombus. Normal compressibility. Other Findings:  None. IMPRESSION: 1. Superficial venous thrombosis of the right greater saphenous vein throughout the calf and thigh. No evidence of suppurative thrombophlebitis. 2. No evidence of bilateral lower extremity deep vein thrombosis. Ruthann Cancer, MD [facility] Electronically Signed   By: Ruthann Cancer M.D.   On: 04/06/2021 09:15   US ARTERIAL ABI (SCREENING LOWER EXTREMITY)  Result Date: 04/05/2021 CLINICAL DATA:  Decreased pedal pulses. EXAM: NONINVASIVE PHYSIOLOGIC VASCULAR STUDY OF BILATERAL LOWER EXTREMITIES TECHNIQUE: Evaluation of both lower extremities were performed at rest, including calculation of ankle-brachial indices with single level Doppler, pressure and pulse volume recording. COMPARISON:  None. FINDINGS: Right ABI:  1.15 Left ABI:  1.26 Right Lower Extremity:  Irregular waveforms. Left Lower Extremity:  Normal arterial waveforms at the ankle. 1.0-1.4 Normal IMPRESSION: Normal resting ankle-brachial indices bilaterally. Electronically Signed   By: Markus Daft M.D.   On: 04/05/2021 08:44   CT Maxillofacial W Contrast  Result Date: 04/04/2021 CLINICAL DATA:  Bilateral  TMJ with limited movement. Mouth pain. Possible malignancy. Fever and chills 3 weeks. EXAM: CT MAXILLOFACIAL WITH CONTRAST TECHNIQUE: Multidetector CT imaging of the maxillofacial structures was performed with intravenous contrast. Multiplanar CT image reconstructions were also generated. RADIATION DOSE REDUCTION: This exam was performed according to the departmental dose-optimization program which includes automated exposure control, adjustment of the mA and/or kV according to patient size and/or use of iterative reconstruction technique. CONTRAST:  137mL OMNIPAQUE IOHEXOL 300 MG/ML  SOLN COMPARISON:  None. FINDINGS: Osseous: Normal TMJ bilaterally. Normal alignment and no degenerative change. No lesion in the mandible. Mild periapical lucency around upper incisors bilaterally. Orbits: Negative for orbital mass or edema. Bilateral cataract extraction. Sinuses: Mucosal edema filling much of the left sphenoid sinus with bony thickening of the left sphenoid sinus. Remaining sinuses clear. Mastoid and middle ear clear bilaterally. Soft tissues: No soft tissue mass. No evidence of cellulitis or soft tissue abscess. Submandibular parotid glands normal bilaterally. Normal pharynx. Limited thyroid negative Limited intracranial: No acute abnormality. IMPRESSION: Normal TMJ bilaterally.  No dislocation or degenerative change. Small periapical lucency around upper incisors bilaterally compatible with dental infection. No evidence of soft tissue cellulitis or soft tissue abscess. No evidence of mass or malignancy  Mucosal edema and bony thickening left sphenoid sinus. Electronically Signed   By: Franchot Gallo M.D.   On: 04/04/2021 17:59   US Abdomen Limited RUQ (LIVER/GB)  Result Date: 04/04/2021 CLINICAL DATA:  Elevated LFTs EXAM: ULTRASOUND ABDOMEN LIMITED RIGHT UPPER QUADRANT COMPARISON:  None. FINDINGS: Gallbladder: Moderate to large amount of echogenic sludge. No shadowing calculi visualized. No significant wall  thickening or pericholecystic edema. Negative sonographic Murphy's sign. Common bile duct: Diameter: 3 mm Liver: No focal lesion identified. Within normal limits in parenchymal echogenicity. Portal vein is patent on color Doppler imaging with normal direction of blood flow towards the liver. Other: None. IMPRESSION: Gallbladder sludge. Electronically Signed   By: Ofilia Neas M.D.   On: 04/04/2021 20:51    Labs:  CBC: Recent Labs    04/07/21 0458 04/08/21 0453 04/09/21 0520 04/10/21 0506  WBC 1.7* 2.5* 3.6* 7.9   8.0  HGB 12.6* 12.0* 12.1* 11.8*   11.7*  HCT 36.9* 35.1* 35.1* 34.4*   34.5*  PLT 16* 19* 28* 30*   30*    COAGS: Recent Labs    04/04/21 1603  APTT 28    BMP: Recent Labs    04/06/21 0530 04/07/21 0458 04/08/21 0453 04/10/21 0506  NA 130* 134* 136 130*  K 4.0 4.0 4.4 4.2  CL 100 100 103 102  CO2 $Re'22 26 27 24  'WFx$ GLUCOSE 255* 237* 210* 181*  BUN 19 28* 31* 29*  CALCIUM 7.9* 8.4* 8.3* 7.7*  CREATININE 0.64 0.64 0.66 0.79  GFRNONAA >60 >60 >60 >60    LIVER FUNCTION TESTS: Recent Labs    04/04/21 1603 04/05/21 1350 04/08/21 0453  BILITOT 2.1* 2.1* 1.4*  AST 128* 108* 27  ALT 174* 150* 81*  ALKPHOS 133* 131* 104  PROT 5.9* 6.2* 5.9*  ALBUMIN 2.4* 2.5* 2.4*    TUMOR MARKERS: No results for input(s): AFPTM, CEA, CA199, CHROMGRNA in the last 8760 hours.  Assessment and Plan:  Thrombocytopenia: Eric Joyce. [place], 76 year old male, presents today to the [facility] Interventional Radiology department for an image-guided bone marrow biopsy with aspiration. He has been NPO.   Risks and benefits of this procedure were discussed with the patient and/or patient's family including, but not limited to bleeding, infection, damage to adjacent structures or low yield requiring additional tests.  All of the questions were answered and there is agreement to proceed.  Consent signed and in chart.  Thank you for this interesting consult.  I  greatly enjoyed [place] and look forward to participating in their care.  A copy of this report was sent to the requesting provider on this date.  Electronically Signed: Soyla Dryer, AGACNP-BC [phone] 04/10/2021, 9:40 AM   I spent a total of 20 Minutes    in face to face in clinical consultation, greater than 50% of which was counseling/coordinating care for bone marrow biopsy with aspiration.

## 2021-04-11 DIAGNOSIS — I2699 Other pulmonary embolism without acute cor pulmonale: Secondary | ICD-10-CM | POA: Diagnosis not present

## 2021-04-11 DIAGNOSIS — D696 Thrombocytopenia, unspecified: Secondary | ICD-10-CM | POA: Diagnosis not present

## 2021-04-11 DIAGNOSIS — D72819 Decreased white blood cell count, unspecified: Secondary | ICD-10-CM | POA: Diagnosis not present

## 2021-04-11 DIAGNOSIS — R161 Splenomegaly, not elsewhere classified: Secondary | ICD-10-CM | POA: Diagnosis not present

## 2021-04-11 LAB — GLUCOSE, CAPILLARY
Glucose-Capillary: 96 mg/dL (ref 70–99)
Glucose-Capillary: 117 mg/dL — ABNORMAL HIGH (ref 70–99)
Glucose-Capillary: 117 mg/dL — ABNORMAL HIGH (ref 70–99)
Glucose-Capillary: 112 mg/dL — ABNORMAL HIGH (ref 70–99)

## 2021-04-11 LAB — CBC
HCT: 37.4 % — ABNORMAL LOW (ref 39.0–52.0)
Hemoglobin: 12.6 g/dL — ABNORMAL LOW (ref 13.0–17.0)
MCH: 31 pg (ref 26.0–34.0)
MCHC: 33.7 g/dL (ref 30.0–36.0)
MCV: 92.1 fL (ref 80.0–100.0)
Platelets: 39 10*3/uL — ABNORMAL LOW (ref 150–400)
RBC: 4.06 MIL/uL — ABNORMAL LOW (ref 4.22–5.81)
RDW: 16.6 % — ABNORMAL HIGH (ref 11.5–15.5)
WBC: 11.8 10*3/uL — ABNORMAL HIGH (ref 4.0–10.5)
nRBC: 0.8 % — ABNORMAL HIGH (ref 0.0–0.2)

## 2021-04-11 LAB — ANTIPHOSPHOLIPID SYNDROME PROF
Anticardiolipin IgG: <9 GPL U/mL (ref 0–14)
Anticardiolipin IgM: <9 MPL U/mL (ref 0–12)
DRVVT: 40.5 s (ref 0.0–47.0)
PTT Lupus Anticoagulant: 27.7 s (ref 0.0–51.9)

## 2021-04-11 NOTE — Progress Notes (Signed)
Mobility Specialist - Progress Note    04/11/21 1700  Mobility  Activity Refused mobility    Pt in chair upon arrival. Pt noted fatigue from previous PT session. Pt refused session and asked to return tomorrow. Will attempt session at another day and time.  Kathee Delton Mobility Specialist 04/11/21, 5:19 PM

## 2021-04-11 NOTE — TOC Progression Note (Signed)
Transition of Care [facility]) - Progression Note    Patient Details  Name: Eric Joyce MRN: [id number] Date of Birth: [birth year]  Transition of Care [facility]) CM/SW [place], [place] Phone Number: 04/11/2021, 9:54 AM  Clinical Narrative:     CSW spoke with patient to provide bed offers, prefers [place] options between [facility] an [facility]. Patient reports he'd like to think about these two options and does not feel comfortable deciding or making a choice at this time. Reports he will continue to review his options and let CSW know once he decides.   CSW explained need for choice in order for insurance auth to be started, patient expressed understanding.    Expected Discharge Plan: Skilled Nursing Facility Barriers to Discharge: Continued Medical Work up  Expected Discharge Plan and Services Expected Discharge Plan: [place]                                               Social Determinants of Health (SDOH) Interventions    Readmission Risk Interventions No flowsheet data found.

## 2021-04-11 NOTE — Progress Notes (Signed)
Physical Therapy Treatment Patient Details Name: Eric Joyce MRN: [id number] DOB: March 04, 1946 Today's Date: 04/11/2021   History of Present Illness 76 y/o male here with generalized weakness after reports of feeling sick for the past few weeks and following pcp visit where pt found to have abnormal labs. Pts PMH is significant for of Htn CAD. Pt reported to have had decreased energy, fever, chills and sweats.    PT Comments    Pt was able to do multiple loops around the nurses' station and did much better today with general confidence, postural awareness, and consistently following cuing.  Pt normally active and independent and is motivated to go home, showed great improvement today though he is still far from his baseline.  Pt did not have any LOBs or overt safety issues and vitals stayed appropriate t/o prolonged bout of activity.    Recommendations for follow up therapy are one component of a multi-disciplinary discharge planning process, led by the attending physician.  Recommendations may be updated based on patient status, additional functional criteria and insurance authorization.  Follow Up Recommendations  Home health PT     Assistance Recommended at Discharge Intermittent Supervision/Assistance  Patient can return home with the following Assist for transportation;Assistance with cooking/housework   Equipment Recommendations  Rolling walker (2 wheels);BSC/3in1    Recommendations for Other Services       Precautions / Restrictions Precautions Precautions: Fall Restrictions Weight Bearing Restrictions: No     Mobility  Bed Mobility Overal bed mobility: Modified Independent Bed Mobility: Supine to Sit     Supine to sit: Modified independent (Device/Increase time)     General bed mobility comments: Pt easily able to get to sitting EOB, displayed much improved posture (than yesterday) once sitting    Transfers Overall transfer level: Modified  independent Equipment used: Rolling walker (2 wheels) Transfers: Sit to/from Stand Sit to Stand: Supervision           General transfer comment: Pt able to rise w/o direct assist, still needing some cuing for set up and sequencing but rose on first attempt w/o assist    Ambulation/Gait Ambulation/Gait assistance: Min assist Gait Distance (Feet): 550 Feet Assistive device: Rolling walker (2 wheels), 1 person hand held assist         General Gait Details: 2 bouts of ambulation with walker, first ~400 ft, then another 140ft.  He showed great effort and willingness to appropriately push himself.  His O2 (mid 90s) and HR (90-110) remained relatively stable t/o the effort and he displayed much improved posture, safety awareness and consistency today vs yesterday.  We did go ~25 ft with single UE use, pt did well but with slower more guarded cadence.   Stairs Stairs: Yes Stairs assistance: Supervision Stair Management: One rail Right, Alternating pattern Number of Stairs: 6 General stair comments: Pt was able to reciprocally ascend and descend steps with single UE use.  He did have one small buckle with R knee controlling descent but was able to self arrest with rail w/o issue.   Wheelchair Mobility    Modified Rankin (Stroke Patients Only)       Balance     Sitting balance-Leahy Scale: Good Sitting balance - Comments: Pt displayed much better posture, awareness and overall confidence this date than prior session   Standing balance support: Bilateral upper extremity supported Standing balance-Leahy Scale: Fair Standing balance comment: Much improved confidence, posture, appropriate use of walker and was even able to take a few guarded steps  w/o UEs/AD.                            Cognition Arousal/Alertness: Awake/alert Behavior During Therapy: WFL for tasks assessed/performed Overall Cognitive Status: Within Functional Limits for tasks assessed                                  General Comments: Pt much more consistent and appropraite today.  Able to hold conversation and maintain cuing adjustments much better        Exercises General Exercises - Lower Extremity Long Arc Quad: Strengthening, 10 reps Heel Slides: Strengthening, 10 reps Hip ABduction/ADduction: Strengthening, 10 reps Hip Flexion/Marching: Strengthening, 10 reps    General Comments        Pertinent Vitals/Pain Pain Assessment Pain Assessment: No/denies pain    Home Living                          Prior Function            PT Goals (current goals can now be found in the care plan section) Progress towards PT goals: Progressing toward goals    Frequency    Min 2X/week      PT Plan Discharge plan needs to be updated    Co-evaluation              AM-PAC PT "6 Clicks" Mobility   Outcome Measure  Help needed turning from your back to your side while in a flat bed without using bedrails?: None Help needed moving from lying on your back to sitting on the side of a flat bed without using bedrails?: None Help needed moving to and from a bed to a chair (including a wheelchair)?: None Help needed standing up from a chair using your arms (e.g., wheelchair or bedside chair)?: None Help needed to walk in hospital room?: None Help needed climbing 3-5 steps with a railing? : A Little 6 Click Score: 23    End of Session Equipment Utilized During Treatment: Gait belt Activity Tolerance: Patient tolerated treatment well Patient left: with chair alarm set;with call bell/phone within reach Nurse Communication: Mobility status PT Visit Diagnosis: Unsteadiness on feet (R26.81);Other abnormalities of gait and mobility (R26.89);Muscle weakness (generalized) (M62.81);Pain     Time: 2229-7989 PT Time Calculation (min) (ACUTE ONLY): 42 min  Charges:  $Gait Training: 8-22 mins $Therapeutic Exercise: 8-22 mins $Therapeutic Activity: 8-22  mins                     Kreg Shropshire, DPT 04/11/2021, 4:24 PM

## 2021-04-11 NOTE — Progress Notes (Signed)
PROGRESS NOTE    Eric Joyce  NAT:[id number] DOB: April 27, 1945 DOA: 04/04/2021 PCP: [facility]   Brief Narrative: Taken from prior notes. Eric Joyce is a 76 y.o. male with medical history significant of Htn CAD sent by pcp for abnormal labs.  Patient is complaining of generalized weakness for about 3 weeks.  No fever or chills. Per H&P patient went to [place] on Christmas for 1 week.  Developed generalized malaise, fever and headache on January 10, tested negative for COVID.  Continue to get worse.  Received 2 weeks of amoxicillin with no improvement.  Developed melena and diarrhea after starting antibiotics.  Also saw some hematuria.  Patient never received any transfusion.  He was found to have pancytopenia and splenomegaly.  GI pathogen negative.  Infectious disease was consulted and patient underwent multiple investigations which were mostly negative.  Mildly elevated EBV PCR which is not clinically significant per ID.  Respiratory viral panel negative, GI pathogen panel negative, ANA negative, Lyme disease panel negative, varicella negative, Bartonella negative.  Erlichia negative. Hematology was consulted for pancytopenia and splenomegaly.  There was some concern of ITP/TTP.  Adams 13 was inconclusive, flow cytometry with monoclonal B-cell lymphocytosis of undetermined significance.  Received 4 days of steroid followed by 2 doses of IVIG.  Bone marrow biopsy was done by IR on 04/10/2021-pending results.  Concern of CLL as very slow improvement with steroid and IVIG.  Hematology will follow-up as an outpatient after getting bone marrow biopsy results for final diagnosis.  Patient also received 3 units of platelet, last on 04/06/2021.  He was also found to have PE, concern of prothrombotic state with some underlying malignancy.  Patient needs anticoagulation which is currently being held due to thrombocytopenia.  Plan is to start him on Eliquis once platelet creased at 50, currently started  improving.  Also found to have a small perirectal abscess most likely secondary to diarrhea.  S/p incision and drainage by general surgery on 04/05/2021.  Abscess cultures with multiple organisms, no Staph aureus.  Received Zosyn initially which was later transitioned to Augmentin and he completed the course.  PT initially recommended SNF, patient wants to go home with home health services which were ordered.  Mobility improved with continuation of physical therapy.  Subjective: Patient was seen and examined today.  No new complaints.  Thinking that he is improving and wants to go home.  Stating that he can get some private help and would like to go home with home health services instead of rehab.  Assessment & Plan:   Principal Problem:   Generalized weakness Active Problems:   CAD (coronary artery disease)   Diabetes mellitus type 2, uncomplicated (HCC)   Alcoholism (HCC)   Other neutropenia (HCC)   Thrombocytopenia (HCC)   Abscess of anal and rectal regions   Fever and chills   Diarrhea   Hypophosphatemia   Acute pulmonary embolism (HCC)   Lactic acidosis   SOB (shortness of breath)   Melena   Rectal abscess  Assessment and Plan: Pancytopenia/splenomegaly/generalized weakness.  No sign of cirrhosis on CT.  Concern of underlying hematologic malignancy.  Hematology was consulted.  Mostly infectious disease work-up was negative.  Flow cytometry with monoclonal B-cell lymphocytosis of undetermined significance. Platelets started improving, at 39 today. Received 3 units of platelets, last on 04/06/2021. Bone marrow biopsy was done by IR on 04/10/2020-pending results. PT is recommending SNF initially but patient wants to go home with home health services which were ordered. -  Hematology will follow up as an outpatient after bone marrow biopsy results.  Pulmonary embolism.  Hemodynamically stable, on room air. Anticoagulation is being held due to thrombocytopenia.  Concern of prothrombotic  state/underlying malignancy. -Plan is to start Eliquis without any bolus by hematology once platelet reached at 50.  Perirectal abscess.  Most likely due to diarrhea.  S/p IND by general surgery on 04/05/2021, culture with multiple organisms, no Staph aureus.  Received Zosyn followed by Augmentin and completed a course of antibiotics.  GERD/hiccups.  Received Flexeril with no improvement in hiccup initially, now resolved. -Continue PPI, Reglan and gabapentin. -Outpatient GI follow-up as needed  CAD.  S/p PCI.  No chest pain -Continue home metoprolol -Holding home aspirin due to thrombocytopenia and concern of bleeding. -Holding home statin due to mild liver dysfunction-can resume on discharge  Type 2 diabetes mellitus.  On metformin at home. -Continues Semglee 30 units at bedtime. -SSI  Objective: Vitals:   04/11/21 0358 04/11/21 0745 04/11/21 1115 04/11/21 1541  BP: 122/78 107/83 119/88 115/85  Pulse: 72 80 87 88  Resp: _0 Temp: 97.8 F (36.6 C) 97.7 F (36.5 C) 97.8 F (36.6 C) (!) 97.5 F (36.4 C)  TempSrc: Oral Oral Oral Oral  SpO2: 97% 94% 98% 97%  Weight:      Height:        Intake/Output Summary (Last 24 hours) at 04/11/2021 1727 Last data filed at 04/11/2021 1700 Gross per 24 hour  Intake 480 ml  Output 2050 ml  Net -1570 ml   Filed Weights   04/05/21 0223 04/09/21 1335  Weight: 98.3 kg 92.5 kg    Examination:  General exam: Appears calm and comfortable  Respiratory system: Clear to auscultation. Respiratory effort normal. Cardiovascular system: S1 & S2 heard, RRR.  Gastrointestinal system: Soft, nontender, nondistended, bowel sounds positive. Central nervous system: Alert and oriented. No focal neurological deficits. Extremities: No edema, no cyanosis, pulses intact and symmetrical. Psychiatry: Judgement and insight appear normal. Mood & affect appropriate.    DVT prophylaxis: SCDs, thrombocytopenia Code Status: Full Family Communication:   Disposition Plan:  Status is: Inpatient Remains inpatient appropriate because: Severity of illness Most likely will be going home tomorrow with home health services.   Level of care: Progressive  All the records are reviewed and case discussed with Care Management/Social Worker. Management plans discussed with the patient, nursing and they are in agreement.  Consultants:  Hematology and oncology Interventional radiology Infectious disease  Procedures:  Antimicrobials:   Data Reviewed: I have personally reviewed following labs and imaging studies  CBC: Recent Labs  Lab 04/05/21 0936 04/05/21 1350 04/06/21 0530 04/06/21 1541 04/07/21 0458 04/08/21 0453 04/09/21 0520 04/10/21 0506 04/11/21 0654  WBC 1.3* 1.2* 1.1*   < > 1.7* 2.5* 3.6* 7.9   8.0 11.8*  NEUTROABS 0.2* 0.3* 0.3*  --  0.4*  --   --  5.3  --   HGB 12.3* 12.9* 12.2*   < > 12.6* 12.0* 12.1* 11.8*   11.7* 12.6*  HCT 35.6* 37.0* 35.0*   < > 36.9* 35.1* 35.1* 34.4*   34.5* 37.4*  MCV 87.9 87.5 86.6   < > 88.7 88.9 89.1 89.6   91.3 92.1  PLT 5* <5* 5*   < > 16* 19* 28* 30*   30* 39*   < > = values in this interval not displayed.   Basic Metabolic Panel: Recent Labs  Lab 04/04/21 1843 04/06/21 0530 04/07/21 0458 04/08/21 0453 04/10/21  0506  NA  --  130* 134* 136 130*  K  --  4.0 4.0 4.4 4.2  CL  --  100 100 103 102  CO2  --  _0 GLUCOSE  --  255* 237* 210* 181*  BUN  --  19 28* 31* 29*  CREATININE  --  0.64 0.64 0.66 0.79  CALCIUM  --  7.9* 8.4* 8.3* 7.7*  MG 1.7  --   --   --   --   PHOS 2.1*  --   --   --   --    GFR: Estimated Creatinine Clearance: 91.6 mL/min (by C-G formula based on SCr of 0.79 mg/dL). Liver Function Tests: Recent Labs  Lab 04/05/21 1350 04/08/21 0453  AST 108* 27  ALT 150* 81*  ALKPHOS 131* 104  BILITOT 2.1* 1.4*  PROT 6.2* 5.9*  ALBUMIN 2.5* 2.4*   Recent Labs  Lab 04/04/21 1843  LIPASE 30   Recent Labs  Lab 04/05/21 0237  AMMONIA 30   Coagulation  Profile: No results for input(s): INR, PROTIME in the last 168 hours. Cardiac Enzymes: Recent Labs  Lab 04/05/21 0237  CKTOTAL 20*   BNP (last 3 results) No results for input(s): PROBNP in the last 8760 hours. HbA1C: No results for input(s): HGBA1C in the last 72 hours. CBG: Recent Labs  Lab 04/10/21 1616 04/10/21 2042 04/11/21 0747 04/11/21 1117 04/11/21 1543  GLUCAP 133* 135* 96 117* 117*   Lipid Profile: No results for input(s): CHOL, HDL, LDLCALC, TRIG, CHOLHDL, LDLDIRECT in the last 72 hours. Thyroid Function Tests: No results for input(s): TSH, T4TOTAL, FREET4, T3FREE, THYROIDAB in the last 72 hours. Anemia Panel: No results for input(s): VITAMINB12, FOLATE, FERRITIN, TIBC, IRON, RETICCTPCT in the last 72 hours. Sepsis Labs: Recent Labs  Lab 04/04/21 1843  PROCALCITON 0.46  LATICACIDVEN 2.4*    Recent Results (from the past 240 hour(s))  Resp Panel by RT-PCR (Flu A&B, Covid) Nasopharyngeal Swab     Status: None   Collection Time: 04/04/21  4:03 PM   Specimen: Nasopharyngeal Swab; Nasopharyngeal(NP) swabs in vial transport medium  Result Value Ref Range Status   SARS Coronavirus 2 by RT PCR NEGATIVE NEGATIVE Final    Comment: (NOTE) SARS-CoV-2 target nucleic acids are NOT DETECTED.  The SARS-CoV-2 RNA is generally detectable in upper respiratory specimens during the acute phase of infection. The lowest concentration of SARS-CoV-2 viral copies this assay can detect is 138 copies/mL. A negative result does not preclude SARS-Cov-2 infection and should not be used as the sole basis for treatment or other patient management decisions. A negative result may occur with  improper specimen collection/handling, submission of specimen other than nasopharyngeal swab, presence of viral mutation(s) within the areas targeted by this assay, and inadequate number of viral copies(<138 copies/mL). A negative result must be combined with clinical observations, patient history,  and epidemiological information. The expected result is Negative.  Fact Sheet for Patients:  EntrepreneurPulse.com.au  Fact Sheet for Healthcare Providers:  IncredibleEmployment.be  This test is no t yet approved or cleared by the [place] FDA and  has been authorized for detection and/or diagnosis of SARS-CoV-2 by FDA under an Emergency Use Authorization (EUA). This EUA will remain  in effect (meaning this test can be used) for the duration of the COVID-19 declaration under Section 564(b)(1) of the Act, 21 U.S.C.section 360bbb-3(b)(1), unless the authorization is terminated  or revoked sooner.       Influenza A by  PCR NEGATIVE NEGATIVE Final   Influenza B by PCR NEGATIVE NEGATIVE Final    Comment: (NOTE) The Xpert Xpress SARS-CoV-2/FLU/RSV plus assay is intended as an aid in the diagnosis of influenza from Nasopharyngeal swab specimens and should not be used as a sole basis for treatment. Nasal washings and aspirates are unacceptable for Xpert Xpress SARS-CoV-2/FLU/RSV testing.  Fact Sheet for Patients: EntrepreneurPulse.com.au  Fact Sheet for Healthcare Providers: IncredibleEmployment.be  This test is not yet approved or cleared by the [place] FDA and has been authorized for detection and/or diagnosis of SARS-CoV-2 by FDA under an Emergency Use Authorization (EUA). This EUA will remain in effect (meaning this test can be used) for the duration of the COVID-19 declaration under Section 564(b)(1) of the Act, 21 U.S.C. section 360bbb-3(b)(1), unless the authorization is terminated or revoked.  Performed at [facility], [address] [place], [place] [address]   Blood culture (routine x 2)     Status: None   Collection Time: 04/04/21  6:43 PM   Specimen: BLOOD  Result Value Ref Range Status   Specimen Description BLOOD RAC  Final   Special Requests BOTTLES DRAWN AEROBIC AND  ANAEROBIC BCAV  Final   Culture   Final    NO GROWTH 5 DAYS Performed at [facility], [address], [place], [place] [address]    Report Status 04/09/2021 FINAL  Final  Blood culture (routine x 2)     Status: None   Collection Time: 04/04/21  6:43 PM   Specimen: BLOOD  Result Value Ref Range Status   Specimen Description BLOOD BLRA  Final   Special Requests BOTTLES DRAWN AEROBIC AND ANAEROBIC [place]  Final   Culture   Final    NO GROWTH 5 DAYS Performed at [facility], [address] [place], [place] [address]    Report Status 04/09/2021 FINAL  Final  C Difficile Quick Screen w PCR reflex     Status: None   Collection Time: 04/05/21  8:35 AM   Specimen: STOOL  Result Value Ref Range Status   C Diff antigen NEGATIVE NEGATIVE Final   C Diff toxin NEGATIVE NEGATIVE Final   C Diff interpretation No C. difficile detected.  Final    Comment: Performed at [facility], [address] [place], [place] [address]  Gastrointestinal Panel by PCR , Stool     Status: None   Collection Time: 04/05/21  1:44 PM   Specimen: STOOL  Result Value Ref Range Status   Campylobacter species NOT DETECTED NOT DETECTED Final   Plesimonas shigelloides NOT DETECTED NOT DETECTED Final   Salmonella species NOT DETECTED NOT DETECTED Final   Yersinia enterocolitica NOT DETECTED NOT DETECTED Final   Vibrio species NOT DETECTED NOT DETECTED Final   Vibrio cholerae NOT DETECTED NOT DETECTED Final   Enteroaggregative E coli (EAEC) NOT DETECTED NOT DETECTED Final   Enteropathogenic E coli (EPEC) NOT DETECTED NOT DETECTED Final   Enterotoxigenic E coli (ETEC) NOT DETECTED NOT DETECTED Final   Shiga like toxin producing E coli (STEC) NOT DETECTED NOT DETECTED Final   Shigella/Enteroinvasive E coli (EIEC) NOT DETECTED NOT DETECTED Final   Cryptosporidium NOT DETECTED NOT DETECTED Final   Cyclospora cayetanensis NOT DETECTED NOT DETECTED Final   Entamoeba histolytica NOT DETECTED NOT  DETECTED Final   Giardia lamblia NOT DETECTED NOT DETECTED Final   Adenovirus F40/41 NOT DETECTED NOT DETECTED Final   Astrovirus NOT DETECTED NOT DETECTED Final   Norovirus GI/GII NOT  DETECTED NOT DETECTED Final   Rotavirus A NOT DETECTED NOT DETECTED Final   Sapovirus (I, II, IV, and V) NOT DETECTED NOT DETECTED Final    Comment: Performed at [facility], [address] [place], [place] [address]  Aerobic/Anaerobic Culture w Gram Stain (surgical/deep wound)     Status: Abnormal   Collection Time: 04/05/21  5:33 PM   Specimen: Wound  Result Value Ref Range Status   Specimen Description   Final    WOUND Performed at [facility], [address] [place], [place] [address]    Special Requests   Final    [facility] ABSCESS Performed at [facility], [address] [place], [place] [address]    Gram Stain   Final    FEW WBC PRESENT, PREDOMINANTLY MONONUCLEAR ABUNDANT GRAM NEGATIVE RODS FEW GRAM POSITIVE COCCI FEW GRAM POSITIVE RODS Performed at [facility], [place] [address], [place], [place] [address]    Culture (A)  Final    MULTIPLE ORGANISMS PRESENT, NONE PREDOMINANT NO STAPHYLOCOCCUS AUREUS ISOLATED NO GROUP A STREP (S.PYOGENES) ISOLATED MIXED ANAEROBIC FLORA PRESENT.  CALL LAB IF FURTHER IID REQUIRED.    Report Status 04/09/2021 FINAL  Final  Varicella-zoster by PCR (Blood or Swab)     Status: None   Collection Time: 04/05/21  5:58 PM   Specimen: Blood  Result Value Ref Range Status   Varicella-Zoster, PCR Negative Negative Final    Comment: (NOTE) No Varicella Zoster Virus DNA detected. This test was developed and its performance characteristics determined by [facility].  It has not been cleared or approved by the Food and Drug Administration.  The FDA has determined that such clearance or approval is not necessary. Performed At: [facility] [place], [place] [id number] Rush Farmer MD LS:[id number]       Radiology Studies: CT BONE MARROW BIOPSY & ASPIRATION  Result Date: 04/10/2021 INDICATION: Thrombocytopenia EXAM: CT GUIDED RIGHT ILIAC BONE MARROW ASPIRATION AND CORE BIOPSY Date:  04/10/2021 04/10/2021 11:00 am Radiologist:  M. Daryll Brod, MD Guidance:  CT FLUOROSCOPY: Fluoroscopy Time: None. MEDICATIONS: 1% lidocaine local ANESTHESIA/SEDATION: 1.0 mg IV Versed; 100 mcg IV Fentanyl Moderate Sedation Time:  10 minute The patient was continuously monitored during the procedure by the interventional radiology nurse under my direct supervision. CONTRAST:  None. COMPLICATIONS: None PROCEDURE: Informed consent was obtained from the patient following explanation of the procedure, risks, benefits and alternatives. The patient understands, agrees and consents for the procedure. All questions were addressed. A time out was performed. The patient was positioned prone and non-contrast localization CT was performed of the pelvis to demonstrate the iliac marrow spaces. Maximal barrier sterile technique utilized including caps, mask, sterile gowns, sterile gloves, large sterile drape, hand hygiene, and Betadine prep. Under sterile conditions and local anesthesia, an 11 gauge coaxial bone biopsy needle was advanced into the right iliac marrow space. Needle position was confirmed with CT imaging. Initially, bone marrow aspiration was performed. Next, the 11 gauge outer cannula was utilized to obtain a right iliac bone marrow core biopsy. Needle was removed. Hemostasis was obtained with compression. The patient tolerated the procedure well. Samples were prepared with the cytotechnologist. No immediate complications. IMPRESSION: CT guided right iliac bone marrow aspiration and core biopsy. Electronically Signed   By: Jerilynn Mages.  Shick M.D.   On: 04/10/2021 11:07    Scheduled Meds:  sodium chloride   Intravenous Once   gabapentin  400 mg Oral TID   insulin aspart  0-20 Units Subcutaneous TID WC   insulin aspart  0-5 Units  Subcutaneous QHS   insulin glargine-yfgn  30 Units Subcutaneous Daily   metoCLOPramide  10 mg Oral TID AC & HS   metoprolol succinate  50 mg Oral Daily   multivitamin with minerals  1 tablet Oral Daily   pantoprazole  40 mg Oral Daily   polyethylene glycol  17 g Oral Daily   Ensure Max Protein  11 oz Oral BID   Continuous Infusions:   LOS: 7 days   Time spent: 50 minutes. More than 50% of the time was spent in counseling/coordination of care  Lorella Nimrod, MD [facility]  If 7PM-7AM, please contact night-coverage [email or web address]  04/11/2021, 5:27 PM   This record has been created using Systems analyst. Errors have been sought and corrected,but may not always be located. Such creation errors do not reflect on the standard of care.

## 2021-04-11 NOTE — Progress Notes (Signed)
Date of Admission:  04/04/2021      ID: Eric Joyce is a 76 y.o. male Principal Problem:   Generalized weakness Active Problems:   CAD (coronary artery disease)   Diabetes mellitus type 2, uncomplicated (HCC)   Alcoholism ([place])   Other neutropenia (HCC)   Thrombocytopenia (HCC)   Abscess of anal and rectal regions   Fever and chills   Diarrhea   Hypophosphatemia   Acute pulmonary embolism (HCC)   Lactic acidosis   SOB (shortness of breath)   Melena   Rectal abscess    Subjective: Doing much better More energy No hiccups No fever since admission Medications:   sodium chloride   Intravenous Once   gabapentin  400 mg Oral TID   insulin aspart  0-20 Units Subcutaneous TID WC   insulin aspart  0-5 Units Subcutaneous QHS   insulin glargine-yfgn  30 Units Subcutaneous Daily   metoCLOPramide  10 mg Oral TID AC & HS   metoprolol succinate  50 mg Oral Daily   multivitamin with minerals  1 tablet Oral Daily   pantoprazole  40 mg Oral Daily   polyethylene glycol  17 g Oral Daily   Ensure Max Protein  11 oz Oral BID    Objective: Vital signs in last 24 hours: Patient Vitals for the past 24 hrs:  BP Temp Temp src Pulse Resp SpO2  04/11/21 0745 107/83 97.7 F (36.5 C) Oral 80 16 94 %  04/11/21 0358 122/78 97.8 F (36.6 C) Oral 72 16 97 %  04/11/21 0000 111/71 (!) 97.5 F (36.4 C) -- 74 16 96 %  04/10/21 2257 121/76 (!) 97.1 F (36.2 C) -- 67 16 97 %  04/10/21 2206 120/79 -- -- 79 16 96 %  04/10/21 2002 108/70 97.6 F (36.4 C) Axillary (!) 57 18 96 %  04/10/21 1855 129/83 (!) 97.1 F (36.2 C) Axillary 71 -- 96 %  04/10/21 1806 136/72 97.7 F (36.5 C) Oral 93 -- 97 %  04/10/21 1710 101/70 97.9 F (36.6 C) Oral 72 -- 98 %  04/10/21 1655 104/74 98 F (36.7 C) Oral 61 -- 96 %  04/10/21 1640 107/73 97.7 F (36.5 C) Oral 76 -- 96 %  04/10/21 1625 108/80 97.7 F (36.5 C) Oral 85 -- 95 %  04/10/21 1602 130/71 (!) 97.5 F (36.4 C) Oral 77 -- 96 %  04/10/21 1519  110/78 (!) 97.5 F (36.4 C) -- 81 20 96 %  04/10/21 1200 126/79 97.6 F (36.4 C) Oral 83 -- 96 %  04/10/21 1115 124/86 -- -- 77 12 94 %    PHYSICAL EXAM:  General: Alert, no distress,  Lungs: Clear to auscultation bilaterally. No Wheezing or Rhonchi. No rales. Heart: Regular rate and rhythm, no murmur, rub or gallop. Abdomen: Soft,  distended. Bowel sounds normal. No masses Extremities: atraumatic, no cyanosis. No edema. No clubbing Skin: No rashes or lesions. Or bruising Lymph: Cervical, supraclavicular normal. Neurologic: Grossly non-focal  Lab Results CBC Latest Ref Rng & Units 04/11/2021 04/10/2021 04/10/2021  WBC 4.0 - 10.5 K/uL 11.8(H) 7.9 8.0  Hemoglobin 13.0 - 17.0 g/dL 12.6(L) 11.8(L) 11.7(L)  Hematocrit 39.0 - 52.0 % 37.4(L) 34.4(L) 34.5(L)  Platelets 150 - 400 K/uL 39(L) 30(L) 30(L)    CMP Latest Ref Rng & Units 04/10/2021 04/08/2021 04/07/2021  Glucose 70 - 99 mg/dL 181(H) 210(H) 237(H)  BUN 8 - 23 mg/dL 29(H) 31(H) 28(H)  Creatinine 0.61 - 1.24 mg/dL 0.79 0.66 0.64  135 - 145 mmol/L 130(L) 136 134(L)  °Potassium 3.5 - 5.1 mmol/L 4.2 4.4 4.0  °Chloride 98 - 111 mmol/L 102 103 100  °CO2 22 - 32 mmol/L 24 27 26  °Calcium 8.9 - 10.3 mg/dL 7.7(L) 8.3(L) 8.4(L)  °Total Protein 6.5 - 8.1 g/dL - 5.9(L) -  °Total Bilirubin 0.3 - 1.2 mg/dL - 1.4(H) -  °Alkaline Phos 38 - 126 U/L - 104 -  °AST 15 - 41 U/L - 27 -  °ALT 0 - 44 U/L - 81(H) -  °  ° °Microbiology: °Blood cultures sent on 04/04/2021 negative so far °Abscess cultures -s Proteus °Studies/Results: °CT BONE MARROW BIOPSY & ASPIRATION ° °Result Date: 04/10/2021 °INDICATION: Thrombocytopenia EXAM: CT GUIDED RIGHT ILIAC BONE MARROW ASPIRATION AND CORE BIOPSY Date:  04/10/2021 04/10/2021 11:00 am Radiologist:  M. Trevor Shick, MD Guidance:  CT FLUOROSCOPY: Fluoroscopy Time: None. MEDICATIONS: 1% lidocaine local ANESTHESIA/SEDATION: 1.0 mg IV Versed; 100 mcg IV Fentanyl Moderate Sedation Time:  10 minute The patient was continuously monitored  during the procedure by the interventional radiology nurse under my direct supervision. CONTRAST:  None. COMPLICATIONS: None PROCEDURE: Informed consent was obtained from the patient following explanation of the procedure, risks, benefits and alternatives. The patient understands, agrees and consents for the procedure. All questions were addressed. A time out was performed. The patient was positioned prone and non-contrast localization CT was performed of the pelvis to demonstrate the iliac marrow spaces. Maximal barrier sterile technique utilized including caps, mask, sterile gowns, sterile gloves, large sterile drape, hand hygiene, and Betadine prep. Under sterile conditions and local anesthesia, an 11 gauge coaxial bone biopsy needle was advanced into the right iliac marrow space. Needle position was confirmed with CT imaging. Initially, bone marrow aspiration was performed. Next, the 11 gauge outer cannula was utilized to obtain a right iliac bone marrow core biopsy. Needle was removed. Hemostasis was obtained with compression. The patient tolerated the procedure well. Samples were prepared with the cytotechnologist. No immediate complications. IMPRESSION: CT guided right iliac bone marrow aspiration and core biopsy. Electronically Signed   By: M.  Shick M.D.   On: 04/10/2021 11:07   ° °Superficial venous thrombosis of the right greater saphenous vein °throughout the calf and thigh. No evidence of suppurative °thrombophlebitis. °2. No evidence of bilateral lower extremity deep vein thrombosis. ° °Assessment/Plan: ° °75-year-old male presenting with subjective fever, fatigue, weight loss, diarrhea and found to have severe thrombocytopenia and leukopenia and transaminitis.  Also has splenomegaly.  He has continuous hiccups and bilateral jaw pain. °Found to have pulmonary embolism.  Etiology of the above is unclear. infection versus malignancy versus other causes ° °No fever since admission  °HE is  improving ° °Infectious work-up has been sent °EBV antibody IgG was > 600 °IgM neg °EBV DNA-237- not significant °CMV DNA-neg °VZV DNA neg °HIV neg °Hepatitis panel neg °Bartonella antibodies-N °RPR-neg °Ehrlichia antibody_N °Q feverN °Cdiff neg °GI panel Neg °RMSF Neg ° °Impression/recommendation °76-year-old male presenting with subjective fever, fatigue, weight loss, diarrhea for the past 3 weeks and found to have severe thrombocytopenia and leukopenia and transaminitis.  Also has splenomegaly.  He has continuous hiccups and bilateral jaw pain.  The latter has resolved °Was also found to have pulmonary embolism and right saphenous vein superficial thrombosis. ° °Etiology of the above is unclear.  Infectious cause ruled out- EBV DNA of 237 is not significant- and is not the cause for his presentation °? Drug induced from NSAID °? Malignancy ° °Flow cytometry   cytometry is indicative of involvement by CD5 positive, CD23 positive, CD20 positive, CD22 positive clonal B-cell population with less than 5000/ul, 2% of the leukocytosis, CLL/SLL phenotype. Patient had bone marrow biopsy done and result is pending   Diarrhea on admission-  has resolved. Clostridium differential and GI panel negative  Anal abscess- had I/D- was on zosyn- and then augmentin and completed course Also has right saphenous vein thrombosis-  No DVT  Discussed the management with patient. ID will sign off- call if needed

## 2021-04-11 NOTE — Progress Notes (Signed)
Hematology/Oncology Progress note [phone] [phone]     [facility] Care Team: [facility] - General (General Practice)   Name of the patient: Eric Joyce  [id number]  10-23-1945  Date of visit: 04/11/21   INTERVAL HISTORY-  Patient denies any bleeding events.  He has finished 5 days of dexamethasone 40 mg daily and IVIG x 2 days.  Platelet count has trended up to 39,000 Patient feels better. He denies shortness of breath, chest pain.   Allergies  Allergen Reactions   Sulfamethoxazole-Trimethoprim     Patient Active Problem List   Diagnosis Date Noted   Rectal abscess    Generalized weakness 04/04/2021   Hypophosphatemia 04/04/2021   Acute pulmonary embolism (HCC) 04/04/2021   Lactic acidosis 04/04/2021   SOB (shortness of breath) 04/04/2021   Melena 04/04/2021   Other neutropenia (HCC)    Thrombocytopenia (HCC)    Abscess of anal and rectal regions    Splenomegaly    Fever and chills    Diarrhea    Abdominal distension (gaseous) 01/18/2021   Actinic keratosis 01/18/2021   Alcoholism (HCC) 01/18/2021   Basal cell carcinoma 01/18/2021   Chronic post-traumatic stress disorder 01/18/2021   COVID-19 01/18/2021   Depression 01/18/2021   Diabetes mellitus with neuropathy (HCC) 01/18/2021   Encounter for immunization 01/18/2021   History of other malignant neoplasm of skin 01/18/2021   Metabolic syndrome 01/18/2021   Nephrolithiasis 01/18/2021   Nocturia 01/18/2021   Osteoarthrosis 01/18/2021   Other urticaria 01/18/2021   Presence of intraocular lens 01/18/2021   Rosacea 01/18/2021   Seborrheic dermatitis 01/18/2021   Viral warts 01/18/2021   Asymptomatic varicose veins 10/19/2020   CAD (coronary artery disease) 10/19/2020   Diabetes mellitus type 2, uncomplicated (HCC) 10/19/2020   Hypertensive disorder 10/19/2020   Class 1 obesity due to excess calories without serious comorbidity with body mass index (BMI) of 30.0 to  30.9 in adult 10/04/2020   Diabetic neuropathy (HCC) 04/13/2020   Hav (hallux abducto valgus), unspecified laterality 04/13/2020   Other specified postprocedural states 01/18/2014   Myocardial infarction (HCC) 06/29/2002   Hyperlipidemia 03/04/2002   Insomnia 03/04/1968     Past Medical History:  Diagnosis Date   Diabetes mellitus without complication (HCC)    Hyperlipidemia    Hypertension    Myocardial infarction (HCC)      Past Surgical History:  Procedure Laterality Date   APPENDECTOMY     CARDIAC SURGERY     2 stents   EYE SURGERY     REPLACEMENT TOTAL KNEE Right    TONSILLECTOMY AND ADENOIDECTOMY      Current Facility-Administered Medications:    0.9 %  sodium chloride infusion (Manually program via Guardrails IV Fluids), , Intravenous, Once, Manuela Schwartz, NP, Held at 04/05/21 [phone]   gabapentin (NEURONTIN) capsule 400 mg, 400 mg, Oral, TID, Wouk, Wilfred Curtis, MD, 400 mg at 04/11/21 1642   hydrOXYzine (ATARAX) tablet 25 mg, 25 mg, Oral, TID PRN, Wouk, Wilfred Curtis, MD   insulin aspart (novoLOG) injection 0-20 Units, 0-20 Units, Subcutaneous, TID WC, Wouk, Wilfred Curtis, MD, 3 Units at 04/10/21 1759   insulin aspart (novoLOG) injection 0-5 Units, 0-5 Units, Subcutaneous, QHS, Wouk, Wilfred Curtis, MD, 2 Units at 04/08/21 2112   insulin glargine-yfgn [facility]) injection 30 Units, 30 Units, Subcutaneous, Daily, Wouk, Wilfred Curtis, MD, 30 Units at 04/11/21 0815   metoCLOPramide (REGLAN) tablet 10 mg, 10 mg, Oral, TID AC & HS, Wouk, Wilfred Curtis, MD, 10 mg at  04/11/21 1643   metoprolol succinate (TOPROL-XL) 24 hr tablet 50 mg, 50 mg, Oral, Daily, Wouk, Ailene Rud, MD, 50 mg at 04/11/21 0815   multivitamin with minerals tablet 1 tablet, 1 tablet, Oral, Daily, Wouk, Ailene Rud, MD, 1 tablet at 04/11/21 0815   ondansetron [facility]) injection 4 mg, 4 mg, Intravenous, Q4H PRN, Mansy, Jan A, MD, 4 mg at 04/06/21 2349   pantoprazole (PROTONIX) EC tablet 40 mg, 40 mg, Oral,  Daily, Wouk, Ailene Rud, MD, 40 mg at 04/11/21 0815   polyethylene glycol (MIRALAX / GLYCOLAX) packet 17 g, 17 g, Oral, Daily, Wouk, Ailene Rud, MD, 17 g at 04/10/21 1235   polyvinyl alcohol (LIQUIFILM TEARS) 1.4 % ophthalmic solution 1 drop, 1 drop, Both Eyes, PRN, Wouk, Ailene Rud, MD   protein supplement (ENSURE MAX) liquid, 11 oz, Oral, BID, Gwynne Edinger, MD, 11 oz at 04/11/21 0815   Physical exam:  Vitals:   04/11/21 0358 04/11/21 0745 04/11/21 1115 04/11/21 1541  BP: 122/78 107/83 119/88 115/85  Pulse: 72 80 87 88  Resp: $Remo'16 16 17 18  'bAqRz$ Temp: 97.8 F (36.6 C) 97.7 F (36.5 C) 97.8 F (36.6 C) (!) 97.5 F (36.4 C)  TempSrc: Oral Oral Oral Oral  SpO2: 97% 94% 98% 97%  Weight:      Height:       Physical Exam Constitutional:      Appearance: He is not diaphoretic.  HENT:     Head: Normocephalic and atraumatic.     Nose: Nose normal.     Mouth/Throat:     Pharynx: No oropharyngeal exudate.  Eyes:     General: No scleral icterus.    Pupils: Pupils are equal, round, and reactive to light.  Cardiovascular:     Rate and Rhythm: Normal rate and regular rhythm.     Heart sounds: No murmur heard. Pulmonary:     Effort: Pulmonary effort is normal. No respiratory distress.  Abdominal:     General: There is no distension.     Palpations: Abdomen is soft.     Tenderness: There is no abdominal tenderness.     Comments: + hiccup  Musculoskeletal:        General: Normal range of motion.     Cervical back: Normal range of motion and neck supple.  Skin:    General: Skin is warm and dry.     Findings: No erythema.     Comments: No significant petechia or bruising  Neurological:     Mental Status: He is alert and oriented to person, place, and time.     Cranial Nerves: No cranial nerve deficit.     Motor: No abnormal muscle tone.     Coordination: Coordination normal.  Psychiatric:        Mood and Affect: Affect normal.       CMP Latest Ref Rng & Units 04/10/2021   Glucose 70 - 99 mg/dL 181(H)  BUN 8 - 23 mg/dL 29(H)  Creatinine 0.61 - 1.24 mg/dL 0.79  Sodium 135 - 145 mmol/L 130(L)  Potassium 3.5 - 5.1 mmol/L 4.2  Chloride 98 - 111 mmol/L 102  CO2 22 - 32 mmol/L 24  Calcium 8.9 - 10.3 mg/dL 7.7(L)  Total Protein 6.5 - 8.1 g/dL -  Total Bilirubin 0.3 - 1.2 mg/dL -  Alkaline Phos 38 - 126 U/L -  AST 15 - 41 U/L -  ALT 0 - 44 U/L -   CBC Latest Ref Rng & Units 04/11/2021  WBC 4.0 - 10.5 K/uL 11.8(H)  Hemoglobin 13.0 - 17.0 g/dL 12.6(L)  Hematocrit 39.0 - 52.0 % 37.4(L)  Platelets 150 - 400 K/uL 39(L)    RADIOGRAPHIC STUDIES: I have personally reviewed the radiological images as listed and agreed with the findings in the report. DG Chest 2 View  Result Date: 04/04/2021 CLINICAL DATA:  Generalized weakness, fever and chills for more than 3 weeks. Black stool and copious diarrhea. EXAM: CHEST - 2 VIEW COMPARISON:  None. FINDINGS: Normal sized heart. Tortuous aorta. Clear lungs. Thoracic spine degenerative changes. IMPRESSION: No acute abnormality. Electronically Signed   By: Claudie Revering M.D.   On: 04/04/2021 17:06   CT Angio Chest Pulmonary Embolism (PE) W or WO Contrast  Result Date: 04/04/2021 CLINICAL DATA:  Weakness and fever for several weeks, initial encounter EXAM: CT ANGIOGRAPHY CHEST WITH CONTRAST TECHNIQUE: Multidetector CT imaging of the chest was performed using the standard protocol during bolus administration of intravenous contrast. Multiplanar CT image reconstructions and MIPs were obtained to evaluate the vascular anatomy. RADIATION DOSE REDUCTION: This exam was performed according to the departmental dose-optimization program which includes automated exposure control, adjustment of the mA and/or kV according to patient size and/or use of iterative reconstruction technique. CONTRAST:  22mL OMNIPAQUE IOHEXOL 350 MG/ML SOLN COMPARISON:  None. FINDINGS: Cardiovascular: Thoracic aorta shows a normal branching pattern. No aneurysmal  dilatation or dissection is noted. No cardiac enlargement is seen. Coronary calcifications are noted. The pulmonary artery shows a normal branching pattern bilaterally. Mild right lower lobe pulmonary emboli are seen posteriorly. No right heart strain is noted. Mediastinum/Nodes: Thoracic inlet is within normal limits. No sizable hilar or mediastinal adenopathy is noted. The esophagus is within normal limits. Lungs/Pleura: Lungs demonstrate mild dependent atelectatic changes. No sizable parenchymal nodules are noted. Upper Abdomen: Visualized upper abdomen is within normal limits. Musculoskeletal: Degenerative changes of the thoracic spine are noted. No acute rib abnormality is noted. Review of the MIP images confirms the above findings. IMPRESSION: Changes consistent with right lower lobe pulmonary emboli. No right heart strain is noted. Mild dependent atelectatic changes. Critical Value/emergent results were communicated the Amion at the time of interpretation on 04/04/2021 at 10:28 Pm to Dr. Florina Ou , who verbally acknowledged these results. Electronically Signed   By: Inez Catalina M.D.   On: 04/04/2021 22:32   CT ABDOMEN PELVIS W CONTRAST  Result Date: 04/04/2021 CLINICAL DATA:  Weakness, fever, chills for 3 weeks, dark stool, diarrhea EXAM: CT ABDOMEN AND PELVIS WITH CONTRAST TECHNIQUE: Multidetector CT imaging of the abdomen and pelvis was performed using the standard protocol following bolus administration of intravenous contrast. RADIATION DOSE REDUCTION: This exam was performed according to the departmental dose-optimization program which includes automated exposure control, adjustment of the mA and/or kV according to patient size and/or use of iterative reconstruction technique. CONTRAST:  125mL OMNIPAQUE IOHEXOL 300 MG/ML  SOLN COMPARISON:  None. FINDINGS: Lower chest: No acute pleural or parenchymal lung disease. Hepatobiliary: No focal liver abnormality is seen. No gallstones, gallbladder wall  thickening, or biliary dilatation. Pancreas: Unremarkable. No pancreatic ductal dilatation or surrounding inflammatory changes. Spleen: The spleen is enlarged measuring 15.5 x 15.0 x 5.7 cm. No focal parenchymal abnormalities. Adrenals/Urinary Tract: There are 2 sub 5 mm nonobstructing calculi within the lower pole left kidney. No right-sided calculi. Small bilateral renal cortical cysts. No obstructive uropathy. Bladder is unremarkable. The adrenals are normal. Stomach/Bowel: No bowel obstruction or ileus. Scattered diverticulosis of the sigmoid colon without evidence of diverticulitis. Mild  circumferential rectal wall thickening. There is a 1.7 x 1.2 cm low-attenuation region dorsal to the anal verge reference image 100/3, which could reflect a small perirectal abscess. Please correlate with physical exam findings. No inflammatory changes. Vascular/Lymphatic: Aortic atherosclerosis. No enlarged abdominal or pelvic lymph nodes. Numerous subcentimeter mesenteric and retroperitoneal lymph nodes are nonspecific. Reproductive: The prostate is mildly enlarged. Surgical clips are seen within the ventral aspect of the prostate. Other: No free fluid or free intraperitoneal gas. There is a small fat containing umbilical hernia. No bowel herniation. Musculoskeletal: No acute or destructive bony lesions. Reconstructed images demonstrate no additional findings. IMPRESSION: 1. Mild nonspecific circumferential rectal wall thickening, with possible small perirectal abscess dorsal to the anal verge. Please correlate with physical exam findings. 2. Distal colonic diverticulosis without diverticulitis. 3. Nonobstructing less than 5 mm left renal calculi. 4. Splenomegaly. 5. Small fat containing umbilical hernia.  No bowel herniation. 6.  Aortic Atherosclerosis (ICD10-I70.0). Electronically Signed   By: Randa Ngo M.D.   On: 04/04/2021 17:50   US Venous Img Lower Bilateral (DVT)  Result Date: 04/06/2021 CLINICAL DATA:   76 year old male with history of pulmonary embolism. EXAM: BILATERAL LOWER EXTREMITY VENOUS DOPPLER ULTRASOUND TECHNIQUE: Gray-scale sonography with graded compression, as well as color Doppler and duplex ultrasound were performed to evaluate the lower extremity deep venous systems from the level of the common femoral vein and including the common femoral, femoral, profunda femoral, popliteal and calf veins including the posterior tibial, peroneal and gastrocnemius veins when visible. The superficial great saphenous vein was also interrogated. Spectral Doppler was utilized to evaluate flow at rest and with distal augmentation maneuvers in the common femoral, femoral and popliteal veins. COMPARISON:  None. FINDINGS: RIGHT LOWER EXTREMITY Common Femoral Vein: No evidence of thrombus. Normal compressibility, respiratory phasicity and response to augmentation. Saphenofemoral Junction: No evidence of thrombus. Normal compressibility and flow on color Doppler imaging. Profunda Femoral Vein: No evidence of thrombus. Normal compressibility and flow on color Doppler imaging. Femoral Vein: No evidence of thrombus. Normal compressibility, respiratory phasicity and response to augmentation. Popliteal Vein: No evidence of thrombus. Normal compressibility, respiratory phasicity and response to augmentation. Calf Veins: No evidence of thrombus. Normal compressibility and flow on color Doppler imaging. Superficial Great Saphenous Vein: Occlusive thrombus throughout the thigh and the calf to approximately 2.7 cm peripheral to the saphenofemoral junction. Other Findings:  None. LEFT LOWER EXTREMITY Common Femoral Vein: No evidence of thrombus. Normal compressibility, respiratory phasicity and response to augmentation. Saphenofemoral Junction: No evidence of thrombus. Normal compressibility and flow on color Doppler imaging. Profunda Femoral Vein: No evidence of thrombus. Normal compressibility and flow on color Doppler imaging.  Femoral Vein: No evidence of thrombus. Normal compressibility, respiratory phasicity and response to augmentation. Popliteal Vein: No evidence of thrombus. Normal compressibility, respiratory phasicity and response to augmentation. Calf Veins: No evidence of thrombus. Normal compressibility and flow on color Doppler imaging. Superficial Great Saphenous Vein: No evidence of thrombus. Normal compressibility. Other Findings:  None. IMPRESSION: 1. Superficial venous thrombosis of the right greater saphenous vein throughout the calf and thigh. No evidence of suppurative thrombophlebitis. 2. No evidence of bilateral lower extremity deep vein thrombosis. Ruthann Cancer, MD [facility] Radiology Electronically Signed   By: Ruthann Cancer M.D.   On: 04/06/2021 09:15   US ARTERIAL ABI (SCREENING LOWER EXTREMITY)  Result Date: 04/05/2021 CLINICAL DATA:  Decreased pedal pulses. EXAM: NONINVASIVE PHYSIOLOGIC VASCULAR STUDY OF BILATERAL LOWER EXTREMITIES TECHNIQUE: Evaluation of both lower extremities were  performed at rest, including calculation of ankle-brachial indices with single level Doppler, pressure and pulse volume recording. COMPARISON:  None. FINDINGS: Right ABI:  1.15 Left ABI:  1.26 Right Lower Extremity:  Irregular waveforms. Left Lower Extremity:  Normal arterial waveforms at the ankle. 1.0-1.4 Normal IMPRESSION: Normal resting ankle-brachial indices bilaterally. Electronically Signed   By: Markus Daft M.D.   On: 04/05/2021 08:44   CT Maxillofacial W Contrast  Result Date: 04/04/2021 CLINICAL DATA:  Bilateral TMJ with limited movement. Mouth pain. Possible malignancy. Fever and chills 3 weeks. EXAM: CT MAXILLOFACIAL WITH CONTRAST TECHNIQUE: Multidetector CT imaging of the maxillofacial structures was performed with intravenous contrast. Multiplanar CT image reconstructions were also generated. RADIATION DOSE REDUCTION: This exam was performed according to the  departmental dose-optimization program which includes automated exposure control, adjustment of the mA and/or kV according to patient size and/or use of iterative reconstruction technique. CONTRAST:  156mL OMNIPAQUE IOHEXOL 300 MG/ML  SOLN COMPARISON:  None. FINDINGS: Osseous: Normal TMJ bilaterally. Normal alignment and no degenerative change. No lesion in the mandible. Mild periapical lucency around upper incisors bilaterally. Orbits: Negative for orbital mass or edema. Bilateral cataract extraction. Sinuses: Mucosal edema filling much of the left sphenoid sinus with bony thickening of the left sphenoid sinus. Remaining sinuses clear. Mastoid and middle ear clear bilaterally. Soft tissues: No soft tissue mass. No evidence of cellulitis or soft tissue abscess. Submandibular parotid glands normal bilaterally. Normal pharynx. Limited thyroid negative Limited intracranial: No acute abnormality. IMPRESSION: Normal TMJ bilaterally.  No dislocation or degenerative change. Small periapical lucency around upper incisors bilaterally compatible with dental infection. No evidence of soft tissue cellulitis or soft tissue abscess. No evidence of mass or malignancy Mucosal edema and bony thickening left sphenoid sinus. Electronically Signed   By: Franchot Gallo M.D.   On: 04/04/2021 17:59   CT BONE MARROW BIOPSY & ASPIRATION  Result Date: 04/10/2021 INDICATION: Thrombocytopenia EXAM: CT GUIDED RIGHT ILIAC BONE MARROW ASPIRATION AND CORE BIOPSY Date:  04/10/2021 04/10/2021 11:00 am Radiologist:  M. Daryll Brod, MD Guidance:  CT FLUOROSCOPY: Fluoroscopy Time: None. MEDICATIONS: 1% lidocaine local ANESTHESIA/SEDATION: 1.0 mg IV Versed; 100 mcg IV Fentanyl Moderate Sedation Time:  10 minute The patient was continuously monitored during the procedure by the interventional radiology nurse under my direct supervision. CONTRAST:  None. COMPLICATIONS: None PROCEDURE: Informed consent was obtained from the patient following explanation of  the procedure, risks, benefits and alternatives. The patient understands, agrees and consents for the procedure. All questions were addressed. A time out was performed. The patient was positioned prone and non-contrast localization CT was performed of the pelvis to demonstrate the iliac marrow spaces. Maximal barrier sterile technique utilized including caps, mask, sterile gowns, sterile gloves, large sterile drape, hand hygiene, and Betadine prep. Under sterile conditions and local anesthesia, an 11 gauge coaxial bone biopsy needle was advanced into the right iliac marrow space. Needle position was confirmed with CT imaging. Initially, bone marrow aspiration was performed. Next, the 11 gauge outer cannula was utilized to obtain a right iliac bone marrow core biopsy. Needle was removed. Hemostasis was obtained with compression. The patient tolerated the procedure well. Samples were prepared with the cytotechnologist. No immediate complications. IMPRESSION: CT guided right iliac bone marrow aspiration and core biopsy. Electronically Signed   By: Jerilynn Mages.  Shick M.D.   On: 04/10/2021 11:07   US Abdomen Limited RUQ (LIVER/GB)  Result Date: 04/04/2021 CLINICAL DATA:  Elevated LFTs EXAM: ULTRASOUND ABDOMEN LIMITED RIGHT UPPER QUADRANT COMPARISON:  None.  FINDINGS: Gallbladder: Moderate to large amount of echogenic sludge. No shadowing calculi visualized. No significant wall thickening or pericholecystic edema. Negative sonographic Murphy's sign. Common bile duct: Diameter: 3 mm Liver: No focal lesion identified. Within normal limits in parenchymal echogenicity. Portal vein is patent on color Doppler imaging with normal direction of blood flow towards the liver. Other: None. IMPRESSION: Gallbladder sludge. Electronically Signed   By: Ofilia Neas M.D.   On: 04/04/2021 20:51    Assessment and plan-   #Thrombocytopenia, which is refractory to platelet transfusion, splenomegaly. Increased immature platelet fraction,  indicating appropriate bone marrow response, possible peripheral blood destruction, secondary to splenomegaly and/or autoimmune or drug-induced ITP. Etiology of splenomegaly is unknown.  Peripheral flow cytometry showed  monoclonal lymphocytosis, less than 5000/ul, 1% myeloblasts.  S/p bone marrow biopsy  CRP is elevated.  ESR is elevated RPR negative adequate vitamin B12 and folate. ANA is negative.   Normal fibrinogen.  Normal haptoglobin Negative hepatitis and HIV.   Repeat smear showed no schistocytes.   EBV IgM is negative.  Positive IgG.  EBV PCR 237 CMV PCR is negative Platelet counts are trending up however still low at 39,000     #Elevated LFT, negative hepatitis panel. Ultrasound abdomen showed sludge gallbladder.  Parenchymal echogenicity within normal limits.  Patent portal vein. Counts are trending down.   #Pulmonary embolism, Superficial venous thrombosis of right greater saphenous vein Xarelto calf and thigh.  No DVT bilaterally. anticoagulation is contraindicated due to severe thrombocytopenia. Negative  antiphospholipid panel, results pending.  Plan to start Eliquis $RemoveBefor'5mg'[id number]$  BID once his platelet count is above 50,000.   #Rectal wall thickening/rectal abscess.-Surgery evaluation.  Status post drainage.  Finish course of Augmentin.  Thank you for allowing me to participate in the care of this patient.  He will need to follow up with HemOnc outpatient for monitoring CBC and also review bone marrow biopsy results. Earlie Server, MD, PhD Hematology Oncology  04/11/2021

## 2021-04-12 ENCOUNTER — Telehealth: Payer: Self-pay

## 2021-04-12 DIAGNOSIS — R161 Splenomegaly, not elsewhere classified: Secondary | ICD-10-CM

## 2021-04-12 LAB — CBC
HCT: 37.6 % — ABNORMAL LOW (ref 39.0–52.0)
Hemoglobin: 12.8 g/dL — ABNORMAL LOW (ref 13.0–17.0)
MCH: 31.3 pg (ref 26.0–34.0)
MCHC: 34 g/dL (ref 30.0–36.0)
MCV: 91.9 fL (ref 80.0–100.0)
Platelets: 47 10*3/uL — ABNORMAL LOW (ref 150–400)
RBC: 4.09 MIL/uL — ABNORMAL LOW (ref 4.22–5.81)
RDW: 17.2 % — ABNORMAL HIGH (ref 11.5–15.5)
WBC: 10.2 10*3/uL (ref 4.0–10.5)
nRBC: 0.2 % (ref 0.0–0.2)

## 2021-04-12 LAB — GLUCOSE, CAPILLARY
Glucose-Capillary: 102 mg/dL — ABNORMAL HIGH (ref 70–99)
Glucose-Capillary: 109 mg/dL — ABNORMAL HIGH (ref 70–99)

## 2021-04-12 MED ORDER — APIXABAN 5 MG PO TABS: 5.0000 mg | ORAL_TABLET | Freq: Two times a day (BID) | ORAL | 1 refills | Status: DC

## 2021-04-12 MED ORDER — HYDROXYZINE HCL 25 MG PO TABS: 25.0000 mg | ORAL_TABLET | Freq: Three times a day (TID) | ORAL | 0 refills | Status: DC | PRN

## 2021-04-12 MED ORDER — ENSURE MAX PROTEIN PO LIQD: 11.0000 [oz_av] | Freq: Two times a day (BID) | ORAL | 12 refills | Status: DC

## 2021-04-12 MED ORDER — PANTOPRAZOLE SODIUM 40 MG PO TBEC: 40.0000 mg | DELAYED_RELEASE_TABLET | Freq: Every day | ORAL | 1 refills | Status: DC

## 2021-04-12 MED ORDER — APIXABAN 5 MG PO TABS
5.0000 mg | ORAL_TABLET | Freq: Two times a day (BID) | ORAL | Status: DC
  Administered 2021-04-12: 5 mg via ORAL
  Filled 2021-04-12: qty 1

## 2021-04-12 NOTE — TOC Transition Note (Addendum)
Transition of Care [facility]) - CM/SW Discharge Note   Patient Details  Name: Eric Joyce MRN: [id number] Date of Birth: [birth year]  [facility] CM/SW Contact:  Alberteen Sam, LCSW Phone Number: 04/12/2021, 1:31 PM   Clinical Narrative:     Patient to discharge home today with home health services PT, referral provided to [facility]. Per Corene Cornea with [facility], [facility] up to start insurance auth for home health services.   Patient to be given rolling walker via [facility] with [facility] to be delivered to hospital room.  Patient identifies no further discharge needs at this time. States his friend will pick him up today.   Final next level of care: [place] Barriers to Discharge: No Barriers Identified   Patient Goals and CMS Choice Patient states their goals for this hospitalization and ongoing recovery are:: to go home CMS Medicare.gov Compare Post Acute Care list provided to:: Patient Choice offered to / list presented to : Patient  Discharge Placement                Patient to be transferred to facility by: friend   Patient and family notified of of transfer: 04/12/21  Discharge Plan and Services                DME Arranged: Gilford Rile DME Agency: [facility] Date DME Agency Contacted: 04/12/21 Time DME Agency Contacted: 1330 Representative spoke with at DME Agency: [place]: PT [place]: [place] ([place]) Date [place]: 04/12/21 Time [place]: [place] Representative spoke with at [place]: [place] ([place]) Interventions     Readmission Risk Interventions No flowsheet data found.

## 2021-04-12 NOTE — Care Management Important Message (Signed)
Important Message  Patient Details  Name: Eric Joyce MRN: [id number] Date of Birth: 11/09/1945   Medicare Important Message Given:  Yes  Reviewed Medicare IM with patient via room phone due to isolation status.  Copy of Medicare IM sent securely to email address provided: [email or web address].     Dannette Barbara 04/12/2021, 1:42 PM

## 2021-04-12 NOTE — Telephone Encounter (Signed)
please schedule patient to have labs on 2/13 (cbc hold tube),also need a PET outpatient, initial staging, splenomegaly, possible CLL. Lab/MD during the week of 2/20 (cbc cmp hold tube).  please try to get PET prior to his follow up. thanks.

## 2021-04-12 NOTE — Discharge Summary (Signed)
Physician Discharge Summary   Patient: Eric Joyce MRN: [id number] DOB: [birth year]  Admit date:     04/04/2021  Discharge date: 04/12/21  Discharge Physician: Arnetha Courser   PCP: [facility]   Recommendations at discharge:   Please get CBC and BMP in 1 week. Please follow-up with your oncologist for your biopsy results and further recommendations. Pending bone marrow biopsy results.  Discharge Diagnoses: Principal Problem:   Generalized weakness Active Problems:   CAD (coronary artery disease)   Diabetes mellitus type 2, uncomplicated (HCC)   Alcoholism (HCC)   Other neutropenia (HCC)   Thrombocytopenia (HCC)   Abscess of anal and rectal regions   Fever and chills   Diarrhea   Hypophosphatemia   Acute pulmonary embolism (HCC)   Lactic acidosis   SOB (shortness of breath)   Melena   Rectal abscess   Hospital Course: Eric Joyce is a 76 y.o. male with medical history significant of Htn CAD sent by pcp for abnormal labs.  Patient is complaining of generalized weakness for about 3 weeks.  No fever or chills. Per H&P patient went to [place] on Christmas for 1 week.  Developed generalized malaise, fever and headache on January 10, tested negative for COVID.  Continue to get worse.  Received 2 weeks of amoxicillin with no improvement.  Developed melena and diarrhea after starting antibiotics.  Also saw some hematuria.  Patient never received any transfusion.   He was found to have pancytopenia and splenomegaly.  GI pathogen negative.  Infectious disease was consulted and patient underwent multiple investigations which were mostly negative.  Mildly elevated EBV PCR which is not clinically significant per ID.  Respiratory viral panel negative, GI pathogen panel negative, ANA negative, Lyme disease panel negative, varicella negative, Bartonella negative.  Erlichia negative. Hematology was consulted for pancytopenia and splenomegaly.  There was some concern of ITP/TTP.  Adams  13 was inconclusive, flow cytometry with monoclonal B-cell lymphocytosis of undetermined significance.  Received 4 days of steroid followed by 2 doses of IVIG.  Bone marrow biopsy was done by IR on 04/10/2021-pending results.  Concern of CLL as very slow improvement with steroid and IVIG.  Hematology will follow-up as an outpatient after getting bone marrow biopsy results for final diagnosis.  Patient also received 3 units of platelet, last on 04/06/2021.  There is a concern of CLL and oncology will follow-up as an outpatient once biopsy results are available.  They are also recommending PET scan before outpatient appointment, oncology department is going to arrange it.  He was also found to have PE, concern of prothrombotic state with some underlying malignancy.  Patient needs anticoagulation which is currently being held due to thrombocytopenia.  He was started on Eliquis 5 mg twice daily, without any loading dose on 04/12/2021 with a platelet count of 47.  We also discontinue home dose of aspirin to decrease the chance of bleeding.  Also found to have a small perirectal abscess most likely secondary to diarrhea.  S/p incision and drainage by general surgery on 04/05/2021.  Abscess cultures with multiple organisms, no Staph aureus.  Received Zosyn initially which was later transitioned to Augmentin and he completed the course.   PT initially recommended SNF, patient wants to go home with home health services which were ordered.  Mobility improved with continuation of physical therapy.  Consultants: Hematology and oncology Interventional radiology Infectious disease Procedures performed: Bone marrow biopsy.  Incision and drainage of perirectal abscess. Disposition: Home Diet recommendation:  Discharge Diet Orders (From admission, onward)     Start     Ordered   04/12/21 0000  Diet - low sodium heart healthy        04/12/21 1247           Cardiac and Carb modified diet  DISCHARGE  MEDICATION: Allergies as of 04/12/2021       Reactions   Sulfamethoxazole-trimethoprim         Medication List     STOP taking these medications    amoxicillin-clavulanate 875-125 MG tablet Commonly known as: AUGMENTIN   aspirin EC 81 MG tablet   DOCOSAHEXAENOIC ACID PO   lisinopril 10 MG tablet Commonly known as: ZESTRIL       TAKE these medications    apixaban 5 MG Tabs tablet Commonly known as: ELIQUIS Take 1 tablet (5 mg total) by mouth 2 (two) times daily.   atorvastatin 40 MG tablet Commonly known as: LIPITOR Take 40 mg by mouth daily.   Cholecalciferol 125 MCG (5000 UT) Tabs Take 5,000 Units by mouth daily.   Coenzyme Q10 100 MG capsule Take 100 mg by mouth daily.   Ensure Max Protein Liqd Take 330 mLs (11 oz total) by mouth 2 (two) times daily.   gabapentin 400 MG capsule Commonly known as: NEURONTIN Take 400 mg by mouth 3 (three) times daily.   hydrOXYzine 25 MG tablet Commonly known as: ATARAX Take 1 tablet (25 mg total) by mouth 3 (three) times daily as needed for anxiety.   melatonin 3 MG Tabs tablet Take 6 mg by mouth at bedtime.   metFORMIN 500 MG tablet Commonly known as: GLUCOPHAGE Take 500 mg by mouth 2 (two) times daily with a meal.   metoprolol succinate 50 MG 24 hr tablet Commonly known as: TOPROL-XL Take 50 mg by mouth daily.   oxybutynin 10 MG 24 hr tablet Commonly known as: DITROPAN-XL Take 1 tablet (10 mg total) by mouth daily.   pantoprazole 40 MG tablet Commonly known as: PROTONIX Take 1 tablet (40 mg total) by mouth daily. Start taking on: April 13, 2021   vitamin B-12 1000 MCG tablet Commonly known as: CYANOCOBALAMIN Take 1,000 mcg by mouth daily.        Follow-up [facility]. Schedule an appointment as soon as possible for a visit in 1 week(s).   Specialty: General Practice Contact information: [address] [place] [address] [phone]         Earlie Server, MD  Follow up in 1 week(s).   Specialty: Oncology Contact information: [place] [address] [phone]                 Discharge Exam: Danley Danker Weights   04/05/21 0223 04/09/21 1335  Weight: 98.3 kg 92.5 kg   General.     In no acute distress. Pulmonary.  Lungs clear bilaterally, normal respiratory effort. CV.  Regular rate and rhythm, no JVD, rub or murmur. Abdomen.  Soft, nontender, nondistended, BS positive. CNS.  Alert and oriented x3.  No focal neurologic deficit. Extremities.  No edema, no cyanosis, pulses intact and symmetrical. Psychiatry.  Judgment and insight appears normal.   Condition at discharge: stable  The results of significant diagnostics from this hospitalization (including imaging, microbiology, ancillary and laboratory) are listed below for reference.   Imaging Studies: DG Chest 2 View  Result Date: 04/04/2021 CLINICAL DATA:  Generalized weakness, fever and chills for more than 3 weeks. Black  stool and copious diarrhea. EXAM: CHEST - 2 VIEW COMPARISON:  None. FINDINGS: Normal sized heart. Tortuous aorta. Clear lungs. Thoracic spine degenerative changes. IMPRESSION: No acute abnormality. Electronically Signed   By: Claudie Revering M.D.   On: 04/04/2021 17:06   CT Angio Chest Pulmonary Embolism (PE) W or WO Contrast  Result Date: 04/04/2021 CLINICAL DATA:  Weakness and fever for several weeks, initial encounter EXAM: CT ANGIOGRAPHY CHEST WITH CONTRAST TECHNIQUE: Multidetector CT imaging of the chest was performed using the standard protocol during bolus administration of intravenous contrast. Multiplanar CT image reconstructions and MIPs were obtained to evaluate the vascular anatomy. RADIATION DOSE REDUCTION: This exam was performed according to the departmental dose-optimization program which includes automated exposure control, adjustment of the mA and/or kV according to patient size and/or use of iterative reconstruction technique. CONTRAST:   5mL OMNIPAQUE IOHEXOL 350 MG/ML SOLN COMPARISON:  None. FINDINGS: Cardiovascular: Thoracic aorta shows a normal branching pattern. No aneurysmal dilatation or dissection is noted. No cardiac enlargement is seen. Coronary calcifications are noted. The pulmonary artery shows a normal branching pattern bilaterally. Mild right lower lobe pulmonary emboli are seen posteriorly. No right heart strain is noted. Mediastinum/Nodes: Thoracic inlet is within normal limits. No sizable hilar or mediastinal adenopathy is noted. The esophagus is within normal limits. Lungs/Pleura: Lungs demonstrate mild dependent atelectatic changes. No sizable parenchymal nodules are noted. Upper Abdomen: Visualized upper abdomen is within normal limits. Musculoskeletal: Degenerative changes of the thoracic spine are noted. No acute rib abnormality is noted. Review of the MIP images confirms the above findings. IMPRESSION: Changes consistent with right lower lobe pulmonary emboli. No right heart strain is noted. Mild dependent atelectatic changes. Critical Value/emergent results were communicated the Amion at the time of interpretation on 04/04/2021 at 10:28 Pm to Dr. Florina Ou , who verbally acknowledged these results. Electronically Signed   By: Inez Catalina M.D.   On: 04/04/2021 22:32   CT ABDOMEN PELVIS W CONTRAST  Result Date: 04/04/2021 CLINICAL DATA:  Weakness, fever, chills for 3 weeks, dark stool, diarrhea EXAM: CT ABDOMEN AND PELVIS WITH CONTRAST TECHNIQUE: Multidetector CT imaging of the abdomen and pelvis was performed using the standard protocol following bolus administration of intravenous contrast. RADIATION DOSE REDUCTION: This exam was performed according to the departmental dose-optimization program which includes automated exposure control, adjustment of the mA and/or kV according to patient size and/or use of iterative reconstruction technique. CONTRAST:  156mL OMNIPAQUE IOHEXOL 300 MG/ML  SOLN COMPARISON:  None. FINDINGS:  Lower chest: No acute pleural or parenchymal lung disease. Hepatobiliary: No focal liver abnormality is seen. No gallstones, gallbladder wall thickening, or biliary dilatation. Pancreas: Unremarkable. No pancreatic ductal dilatation or surrounding inflammatory changes. Spleen: The spleen is enlarged measuring 15.5 x 15.0 x 5.7 cm. No focal parenchymal abnormalities. Adrenals/Urinary Tract: There are 2 sub 5 mm nonobstructing calculi within the lower pole left kidney. No right-sided calculi. Small bilateral renal cortical cysts. No obstructive uropathy. Bladder is unremarkable. The adrenals are normal. Stomach/Bowel: No bowel obstruction or ileus. Scattered diverticulosis of the sigmoid colon without evidence of diverticulitis. Mild circumferential rectal wall thickening. There is a 1.7 x 1.2 cm low-attenuation region dorsal to the anal verge reference image 100/3, which could reflect a small perirectal abscess. Please correlate with physical exam findings. No inflammatory changes. Vascular/Lymphatic: Aortic atherosclerosis. No enlarged abdominal or pelvic lymph nodes. Numerous subcentimeter mesenteric and retroperitoneal lymph nodes are nonspecific. Reproductive: The prostate is mildly enlarged. Surgical clips are seen within the ventral aspect of  the prostate. Other: No free fluid or free intraperitoneal gas. There is a small fat containing umbilical hernia. No bowel herniation. Musculoskeletal: No acute or destructive bony lesions. Reconstructed images demonstrate no additional findings. IMPRESSION: 1. Mild nonspecific circumferential rectal wall thickening, with possible small perirectal abscess dorsal to the anal verge. Please correlate with physical exam findings. 2. Distal colonic diverticulosis without diverticulitis. 3. Nonobstructing less than 5 mm left renal calculi. 4. Splenomegaly. 5. Small fat containing umbilical hernia.  No bowel herniation. 6.  Aortic Atherosclerosis (ICD10-I70.0). Electronically  Signed   By: Randa Ngo M.D.   On: 04/04/2021 17:50   US Venous Img Lower Bilateral (DVT)  Result Date: 04/06/2021 CLINICAL DATA:  76 year old male with history of pulmonary embolism. EXAM: BILATERAL LOWER EXTREMITY VENOUS DOPPLER ULTRASOUND TECHNIQUE: Gray-scale sonography with graded compression, as well as color Doppler and duplex ultrasound were performed to evaluate the lower extremity deep venous systems from the level of the common femoral vein and including the common femoral, femoral, profunda femoral, popliteal and calf veins including the posterior tibial, peroneal and gastrocnemius veins when visible. The superficial great saphenous vein was also interrogated. Spectral Doppler was utilized to evaluate flow at rest and with distal augmentation maneuvers in the common femoral, femoral and popliteal veins. COMPARISON:  None. FINDINGS: RIGHT LOWER EXTREMITY Common Femoral Vein: No evidence of thrombus. Normal compressibility, respiratory phasicity and response to augmentation. Saphenofemoral Junction: No evidence of thrombus. Normal compressibility and flow on color Doppler imaging. Profunda Femoral Vein: No evidence of thrombus. Normal compressibility and flow on color Doppler imaging. Femoral Vein: No evidence of thrombus. Normal compressibility, respiratory phasicity and response to augmentation. Popliteal Vein: No evidence of thrombus. Normal compressibility, respiratory phasicity and response to augmentation. Calf Veins: No evidence of thrombus. Normal compressibility and flow on color Doppler imaging. Superficial Great Saphenous Vein: Occlusive thrombus throughout the thigh and the calf to approximately 2.7 cm peripheral to the saphenofemoral junction. Other Findings:  None. LEFT LOWER EXTREMITY Common Femoral Vein: No evidence of thrombus. Normal compressibility, respiratory phasicity and response to augmentation. Saphenofemoral Junction: No evidence of thrombus. Normal compressibility and flow  on color Doppler imaging. Profunda Femoral Vein: No evidence of thrombus. Normal compressibility and flow on color Doppler imaging. Femoral Vein: No evidence of thrombus. Normal compressibility, respiratory phasicity and response to augmentation. Popliteal Vein: No evidence of thrombus. Normal compressibility, respiratory phasicity and response to augmentation. Calf Veins: No evidence of thrombus. Normal compressibility and flow on color Doppler imaging. Superficial Great Saphenous Vein: No evidence of thrombus. Normal compressibility. Other Findings:  None. IMPRESSION: 1. Superficial venous thrombosis of the right greater saphenous vein throughout the calf and thigh. No evidence of suppurative thrombophlebitis. 2. No evidence of bilateral lower extremity deep vein thrombosis. Ruthann Cancer, MD [facility] Electronically Signed   By: Ruthann Cancer M.D.   On: 04/06/2021 09:15   US ARTERIAL ABI (SCREENING LOWER EXTREMITY)  Result Date: 04/05/2021 CLINICAL DATA:  Decreased pedal pulses. EXAM: NONINVASIVE PHYSIOLOGIC VASCULAR STUDY OF BILATERAL LOWER EXTREMITIES TECHNIQUE: Evaluation of both lower extremities were performed at rest, including calculation of ankle-brachial indices with single level Doppler, pressure and pulse volume recording. COMPARISON:  None. FINDINGS: Right ABI:  1.15 Left ABI:  1.26 Right Lower Extremity:  Irregular waveforms. Left Lower Extremity:  Normal arterial waveforms at the ankle. 1.0-1.4 Normal IMPRESSION: Normal resting ankle-brachial indices bilaterally. Electronically Signed   By: Markus Daft M.D.   On: 04/05/2021 08:44   CT Maxillofacial  W Contrast  Result Date: 04/04/2021 CLINICAL DATA:  Bilateral TMJ with limited movement. Mouth pain. Possible malignancy. Fever and chills 3 weeks. EXAM: CT MAXILLOFACIAL WITH CONTRAST TECHNIQUE: Multidetector CT imaging of the maxillofacial structures was performed with intravenous  contrast. Multiplanar CT image reconstructions were also generated. RADIATION DOSE REDUCTION: This exam was performed according to the departmental dose-optimization program which includes automated exposure control, adjustment of the mA and/or kV according to patient size and/or use of iterative reconstruction technique. CONTRAST:  120mL OMNIPAQUE IOHEXOL 300 MG/ML  SOLN COMPARISON:  None. FINDINGS: Osseous: Normal TMJ bilaterally. Normal alignment and no degenerative change. No lesion in the mandible. Mild periapical lucency around upper incisors bilaterally. Orbits: Negative for orbital mass or edema. Bilateral cataract extraction. Sinuses: Mucosal edema filling much of the left sphenoid sinus with bony thickening of the left sphenoid sinus. Remaining sinuses clear. Mastoid and middle ear clear bilaterally. Soft tissues: No soft tissue mass. No evidence of cellulitis or soft tissue abscess. Submandibular parotid glands normal bilaterally. Normal pharynx. Limited thyroid negative Limited intracranial: No acute abnormality. IMPRESSION: Normal TMJ bilaterally.  No dislocation or degenerative change. Small periapical lucency around upper incisors bilaterally compatible with dental infection. No evidence of soft tissue cellulitis or soft tissue abscess. No evidence of mass or malignancy Mucosal edema and bony thickening left sphenoid sinus. Electronically Signed   By: Franchot Gallo M.D.   On: 04/04/2021 17:59   CT BONE MARROW BIOPSY & ASPIRATION  Result Date: 04/10/2021 INDICATION: Thrombocytopenia EXAM: CT GUIDED RIGHT ILIAC BONE MARROW ASPIRATION AND CORE BIOPSY Date:  04/10/2021 04/10/2021 11:00 am Radiologist:  M. Daryll Brod, MD Guidance:  CT FLUOROSCOPY: Fluoroscopy Time: None. MEDICATIONS: 1% lidocaine local ANESTHESIA/SEDATION: 1.0 mg IV Versed; 100 mcg IV Fentanyl Moderate Sedation Time:  10 minute The patient was continuously monitored during the procedure by the interventional radiology nurse under my  direct supervision. CONTRAST:  None. COMPLICATIONS: None PROCEDURE: Informed consent was obtained from the patient following explanation of the procedure, risks, benefits and alternatives. The patient understands, agrees and consents for the procedure. All questions were addressed. A time out was performed. The patient was positioned prone and non-contrast localization CT was performed of the pelvis to demonstrate the iliac marrow spaces. Maximal barrier sterile technique utilized including caps, mask, sterile gowns, sterile gloves, large sterile drape, hand hygiene, and Betadine prep. Under sterile conditions and local anesthesia, an 11 gauge coaxial bone biopsy needle was advanced into the right iliac marrow space. Needle position was confirmed with CT imaging. Initially, bone marrow aspiration was performed. Next, the 11 gauge outer cannula was utilized to obtain a right iliac bone marrow core biopsy. Needle was removed. Hemostasis was obtained with compression. The patient tolerated the procedure well. Samples were prepared with the cytotechnologist. No immediate complications. IMPRESSION: CT guided right iliac bone marrow aspiration and core biopsy. Electronically Signed   By: Jerilynn Mages.  Shick M.D.   On: 04/10/2021 11:07   US Abdomen Limited RUQ (LIVER/GB)  Result Date: 04/04/2021 CLINICAL DATA:  Elevated LFTs EXAM: ULTRASOUND ABDOMEN LIMITED RIGHT UPPER QUADRANT COMPARISON:  None. FINDINGS: Gallbladder: Moderate to large amount of echogenic sludge. No shadowing calculi visualized. No significant wall thickening or pericholecystic edema. Negative sonographic Murphy's sign. Common bile duct: Diameter: 3 mm Liver: No focal lesion identified. Within normal limits in parenchymal echogenicity. Portal vein is patent on color Doppler imaging with normal direction of blood flow towards the liver. Other: None. IMPRESSION: Gallbladder sludge. Electronically Signed   By: Ofilia Neas  M.D.   On: 04/04/2021 20:51     Microbiology: Results for orders placed or performed during the hospital encounter of 04/04/21  Resp Panel by RT-PCR (Flu A&B, Covid) Nasopharyngeal Swab     Status: None   Collection Time: 04/04/21  4:03 PM   Specimen: Nasopharyngeal Swab; Nasopharyngeal(NP) swabs in vial transport medium  Result Value Ref Range Status   SARS Coronavirus 2 by RT PCR NEGATIVE NEGATIVE Final    Comment: (NOTE) SARS-CoV-2 target nucleic acids are NOT DETECTED.  The SARS-CoV-2 RNA is generally detectable in upper respiratory specimens during the acute phase of infection. The lowest concentration of SARS-CoV-2 viral copies this assay can detect is 138 copies/mL. A negative result does not preclude SARS-Cov-2 infection and should not be used as the sole basis for treatment or other patient management decisions. A negative result may occur with  improper specimen collection/handling, submission of specimen other than nasopharyngeal swab, presence of viral mutation(s) within the areas targeted by this assay, and inadequate number of viral copies(<138 copies/mL). A negative result must be combined with clinical observations, patient history, and epidemiological information. The expected result is Negative.  Fact Sheet for Patients:  EntrepreneurPulse.com.au  Fact Sheet for Healthcare Providers:  IncredibleEmployment.be  This test is no t yet approved or cleared by the [place] FDA and  has been authorized for detection and/or diagnosis of SARS-CoV-2 by FDA under an Emergency Use Authorization (EUA). This EUA will remain  in effect (meaning this test can be used) for the duration of the COVID-19 declaration under Section 564(b)(1) of the Act, 21 U.S.C.section 360bbb-3(b)(1), unless the authorization is terminated  or revoked sooner.       Influenza A by PCR NEGATIVE NEGATIVE Final   Influenza B by PCR NEGATIVE NEGATIVE Final    Comment: (NOTE) The Xpert  Xpress SARS-CoV-2/FLU/RSV plus assay is intended as an aid in the diagnosis of influenza from Nasopharyngeal swab specimens and should not be used as a sole basis for treatment. Nasal washings and aspirates are unacceptable for Xpert Xpress SARS-CoV-2/FLU/RSV testing.  Fact Sheet for Patients: EntrepreneurPulse.com.au  Fact Sheet for Healthcare Providers: IncredibleEmployment.be  This test is not yet approved or cleared by the [place] FDA and has been authorized for detection and/or diagnosis of SARS-CoV-2 by FDA under an Emergency Use Authorization (EUA). This EUA will remain in effect (meaning this test can be used) for the duration of the COVID-19 declaration under Section 564(b)(1) of the Act, 21 U.S.C. section 360bbb-3(b)(1), unless the authorization is terminated or revoked.  Performed at [facility], [address] [place], [place] [address]   Blood culture (routine x 2)     Status: None   Collection Time: 04/04/21  6:43 PM   Specimen: BLOOD  Result Value Ref Range Status   Specimen Description BLOOD RAC  Final   Special Requests BOTTLES DRAWN AEROBIC AND ANAEROBIC BCAV  Final   Culture   Final    NO GROWTH 5 DAYS Performed at [facility], [address], [place], [place] [address]    Report Status 04/09/2021 FINAL  Final  Blood culture (routine x 2)     Status: None   Collection Time: 04/04/21  6:43 PM   Specimen: BLOOD  Result Value Ref Range Status   Specimen Description BLOOD BLRA  Final   Special Requests BOTTLES DRAWN AEROBIC AND ANAEROBIC [place]  Final   Culture   Final    NO GROWTH 5 DAYS Performed at [facility],  [place], [place] [address]    Report Status 04/09/2021 FINAL  Final  C Difficile Quick Screen w PCR reflex     Status: None   Collection Time: 04/05/21  8:35 AM   Specimen: STOOL  Result Value Ref Range Status   C Diff antigen NEGATIVE NEGATIVE Final   C  Diff toxin NEGATIVE NEGATIVE Final   C Diff interpretation No C. difficile detected.  Final    Comment: Performed at [facility], [address] [place], [place] [address]  Gastrointestinal Panel by PCR , Stool     Status: None   Collection Time: 04/05/21  1:44 PM   Specimen: STOOL  Result Value Ref Range Status   Campylobacter species NOT DETECTED NOT DETECTED Final   Plesimonas shigelloides NOT DETECTED NOT DETECTED Final   Salmonella species NOT DETECTED NOT DETECTED Final   Yersinia enterocolitica NOT DETECTED NOT DETECTED Final   Vibrio species NOT DETECTED NOT DETECTED Final   Vibrio cholerae NOT DETECTED NOT DETECTED Final   Enteroaggregative E coli (EAEC) NOT DETECTED NOT DETECTED Final   Enteropathogenic E coli (EPEC) NOT DETECTED NOT DETECTED Final   Enterotoxigenic E coli (ETEC) NOT DETECTED NOT DETECTED Final   Shiga like toxin producing E coli (STEC) NOT DETECTED NOT DETECTED Final   Shigella/Enteroinvasive E coli (EIEC) NOT DETECTED NOT DETECTED Final   Cryptosporidium NOT DETECTED NOT DETECTED Final   Cyclospora cayetanensis NOT DETECTED NOT DETECTED Final   Entamoeba histolytica NOT DETECTED NOT DETECTED Final   Giardia lamblia NOT DETECTED NOT DETECTED Final   Adenovirus F40/41 NOT DETECTED NOT DETECTED Final   Astrovirus NOT DETECTED NOT DETECTED Final   Norovirus GI/GII NOT DETECTED NOT DETECTED Final   Rotavirus A NOT DETECTED NOT DETECTED Final   Sapovirus (I, II, IV, and V) NOT DETECTED NOT DETECTED Final    Comment: Performed at [facility], [address] [place], [place] [address]  Aerobic/Anaerobic Culture w Gram Stain (surgical/deep wound)     Status: Abnormal   Collection Time: 04/05/21  5:33 PM   Specimen: Wound  Result Value Ref Range Status   Specimen Description   Final    WOUND Performed at [facility], [address] [place], [place] [address]    Special Requests   Final    PERIRECTAL ABSCESS Performed at  [facility], [address] [place],  [address]    Gram Stain   Final    FEW WBC PRESENT, PREDOMINANTLY MONONUCLEAR ABUNDANT GRAM NEGATIVE RODS FEW GRAM POSITIVE COCCI FEW GRAM POSITIVE RODS Performed at [facility], [address], [place],  [address]    Culture (A)  Final    MULTIPLE ORGANISMS PRESENT, NONE PREDOMINANT NO STAPHYLOCOCCUS AUREUS ISOLATED NO GROUP A STREP (S.PYOGENES) ISOLATED MIXED ANAEROBIC FLORA PRESENT.  CALL LAB IF FURTHER IID REQUIRED.    Report Status 04/09/2021 FINAL  Final  Varicella-zoster by PCR (Blood or Swab)     Status: None   Collection Time: 04/05/21  5:58 PM   Specimen: Blood  Result Value Ref Range Status   Varicella-Zoster, PCR Negative Negative Final    Comment: (NOTE) No Varicella Zoster Virus DNA detected. This test was developed and its performance characteristics determined by [facility].  It has not been cleared or approved by the Food and Drug Administration.  The FDA has determined that such clearance or approval is not necessary. Performed At: [facility] [address] [place], [place] [id number] Perlie Gold  Derinda Late MD BZ:[id number]     Labs: CBC: Recent Labs  Lab 04/05/21 1350 04/06/21 0530 04/06/21 1541 04/07/21 0458 04/08/21 0453 04/09/21 0520 04/10/21 0506 04/11/21 0654 04/12/21 0525  WBC 1.2* 1.1*   < > 1.7* 2.5* 3.6* 7.9   8.0 11.8* 10.2  NEUTROABS 0.3* 0.3*  --  0.4*  --   --  5.3  --   --   HGB 12.9* 12.2*   < > 12.6* 12.0* 12.1* 11.8*   11.7* 12.6* 12.8*  HCT 37.0* 35.0*   < > 36.9* 35.1* 35.1* 34.4*   34.5* 37.4* 37.6*  MCV 87.5 86.6   < > 88.7 88.9 89.1 89.6   91.3 92.1 91.9  PLT <5* 5*   < > 16* 19* 28* 30*   30* 39* 47*   < > = values in this interval not displayed.   Basic Metabolic Panel: Recent Labs  Lab 04/06/21 0530 04/07/21 0458 04/08/21 0453 04/10/21 0506  NA 130* 134* 136 130*  K 4.0 4.0 4.4 4.2  CL 100 100 103 102  CO2 $Re'22 26 27 24  'Tfm$ GLUCOSE 255* 237*  210* 181*  BUN 19 28* 31* 29*  CREATININE 0.64 0.64 0.66 0.79  CALCIUM 7.9* 8.4* 8.3* 7.7*   Liver Function Tests: Recent Labs  Lab 04/05/21 1350 04/08/21 0453  AST 108* 27  ALT 150* 81*  ALKPHOS 131* 104  BILITOT 2.1* 1.4*  PROT 6.2* 5.9*  ALBUMIN 2.5* 2.4*   CBG: Recent Labs  Lab 04/11/21 1117 04/11/21 1543 04/11/21 2207 04/12/21 0732 04/12/21 1201  GLUCAP 117* 117* 112* 102* 109*    Discharge time spent: greater than 30 minutes.  Signed: Lorella Nimrod, MD [facility] 04/12/2021

## 2021-04-13 DIAGNOSIS — K611 Rectal abscess: Secondary | ICD-10-CM | POA: Diagnosis not present

## 2021-04-13 DIAGNOSIS — R531 Weakness: Secondary | ICD-10-CM | POA: Diagnosis not present

## 2021-04-13 LAB — SURGICAL PATHOLOGY

## 2021-04-16 ENCOUNTER — Inpatient Hospital Stay: Payer: PPO

## 2021-04-16 ENCOUNTER — Other Ambulatory Visit: Payer: Self-pay

## 2021-04-16 ENCOUNTER — Telehealth: Payer: Self-pay | Admitting: Oncology

## 2021-04-16 DIAGNOSIS — R161 Splenomegaly, not elsewhere classified: Secondary | ICD-10-CM

## 2021-04-16 NOTE — Telephone Encounter (Signed)
FYI

## 2021-04-16 NOTE — Telephone Encounter (Signed)
Pt is not going to get his PET scan this week or labs. He was in the hospital for 9 days and he is really tired. He wants to take a week to get himself back to some normalcy. I informed the patient that our PET machine would be down and he is willing to go to [facility] for the scan.

## 2021-04-16 NOTE — Telephone Encounter (Signed)
Called patient back and informed him that Dr. Tasia Catchings recommends for him to get labs this weeks. Due to his platelet count he is at risk for bleeding and clotting. Pt verbalized understanding and would like to come for labs tomorrow at 11am. He also stated that he wanted PET rescheduled to next week, if possible, due to having other appts.

## 2021-04-17 ENCOUNTER — Other Ambulatory Visit: Payer: Self-pay

## 2021-04-17 ENCOUNTER — Telehealth: Payer: Self-pay

## 2021-04-17 ENCOUNTER — Inpatient Hospital Stay: Payer: PPO | Attending: Oncology

## 2021-04-17 DIAGNOSIS — D693 Immune thrombocytopenic purpura: Secondary | ICD-10-CM | POA: Diagnosis not present

## 2021-04-17 DIAGNOSIS — D72820 Lymphocytosis (symptomatic): Secondary | ICD-10-CM | POA: Diagnosis not present

## 2021-04-17 DIAGNOSIS — R161 Splenomegaly, not elsewhere classified: Secondary | ICD-10-CM

## 2021-04-17 LAB — SAMPLE TO BLOOD BANK

## 2021-04-17 LAB — CBC WITH DIFFERENTIAL/PLATELET
Abs Immature Granulocytes: 0.02 10*3/uL (ref 0.00–0.07)
Basophils Absolute: 0 10*3/uL (ref 0.0–0.1)
Basophils Relative: 0 %
Eosinophils Absolute: 0 10*3/uL (ref 0.0–0.5)
Eosinophils Relative: 0 %
HCT: 39.7 % (ref 39.0–52.0)
Hemoglobin: 13.4 g/dL (ref 13.0–17.0)
Immature Granulocytes: 0 %
Lymphocytes Relative: 48 %
Lymphs Abs: 2.3 10*3/uL (ref 0.7–4.0)
MCH: 31.3 pg (ref 26.0–34.0)
MCHC: 33.8 g/dL (ref 30.0–36.0)
MCV: 92.8 fL (ref 80.0–100.0)
Monocytes Absolute: 1.1 10*3/uL — ABNORMAL HIGH (ref 0.1–1.0)
Monocytes Relative: 22 %
Neutro Abs: 1.5 10*3/uL — ABNORMAL LOW (ref 1.7–7.7)
Neutrophils Relative %: 30 %
Platelets: 157 10*3/uL (ref 150–400)
RBC: 4.28 MIL/uL (ref 4.22–5.81)
RDW: 17.9 % — ABNORMAL HIGH (ref 11.5–15.5)
Smear Review: NORMAL
WBC: 4.9 10*3/uL (ref 4.0–10.5)
nRBC: 0 % (ref 0.0–0.2)

## 2021-04-17 NOTE — Telephone Encounter (Signed)
Appts scheduled, pt is aware of appts.

## 2021-04-17 NOTE — Telephone Encounter (Signed)
Please add lab when he comes back to see MD as well.

## 2021-04-17 NOTE — Telephone Encounter (Signed)
Patient informed that platelets have normalized and advised to keep PET and lab/MD appts scheduled. Patient verbalized understanding.

## 2021-04-17 NOTE — Telephone Encounter (Signed)
Please r/s PET to next week & r/s lab/MD to be a few days after PET. Please inform pt of appts.

## 2021-04-18 ENCOUNTER — Encounter (HOSPITAL_COMMUNITY): Payer: Self-pay | Admitting: Oncology

## 2021-04-19 ENCOUNTER — Other Ambulatory Visit: Payer: PPO

## 2021-04-24 ENCOUNTER — Ambulatory Visit: Payer: PPO | Admitting: Oncology

## 2021-04-24 ENCOUNTER — Other Ambulatory Visit: Payer: PPO

## 2021-04-30 ENCOUNTER — Ambulatory Visit (HOSPITAL_COMMUNITY)
Admission: RE | Admit: 2021-04-30 | Discharge: 2021-04-30 | Disposition: A | Discharge status: Home / Self Care | Payer: PPO | Source: Ambulatory Visit | Attending: Oncology | Admitting: Oncology

## 2021-04-30 ENCOUNTER — Other Ambulatory Visit: Payer: Self-pay

## 2021-04-30 DIAGNOSIS — K429 Umbilical hernia without obstruction or gangrene: Secondary | ICD-10-CM | POA: Diagnosis not present

## 2021-04-30 DIAGNOSIS — K5730 Diverticulosis of large intestine without perforation or abscess without bleeding: Secondary | ICD-10-CM | POA: Diagnosis not present

## 2021-04-30 DIAGNOSIS — R161 Splenomegaly, not elsewhere classified: Secondary | ICD-10-CM | POA: Diagnosis not present

## 2021-04-30 DIAGNOSIS — I2510 Atherosclerotic heart disease of native coronary artery without angina pectoris: Secondary | ICD-10-CM | POA: Diagnosis not present

## 2021-04-30 DIAGNOSIS — N281 Cyst of kidney, acquired: Secondary | ICD-10-CM | POA: Diagnosis not present

## 2021-04-30 LAB — GLUCOSE, CAPILLARY: Glucose-Capillary: 125 mg/dL — ABNORMAL HIGH (ref 70–99)

## 2021-04-30 MED ORDER — FLUDEOXYGLUCOSE F - 18 (FDG) INJECTION
10.2000 | Freq: Once | INTRAVENOUS | Status: AC
  Administered 2021-04-30: 9.9 via INTRAVENOUS

## 2021-05-02 ENCOUNTER — Inpatient Hospital Stay: Payer: PPO

## 2021-05-02 ENCOUNTER — Inpatient Hospital Stay: Payer: PPO | Attending: Oncology | Admitting: Oncology

## 2021-05-02 ENCOUNTER — Encounter: Payer: Self-pay | Admitting: Oncology

## 2021-05-02 ENCOUNTER — Other Ambulatory Visit: Payer: Self-pay

## 2021-05-02 VITALS — BP 165/77 | HR 82 | Temp 97.5°F | Ht 73.0 in | Wt 210.0 lb

## 2021-05-02 DIAGNOSIS — Z79899 Other long term (current) drug therapy: Secondary | ICD-10-CM | POA: Insufficient documentation

## 2021-05-02 DIAGNOSIS — Z87891 Personal history of nicotine dependence: Secondary | ICD-10-CM | POA: Diagnosis not present

## 2021-05-02 DIAGNOSIS — D709 Neutropenia, unspecified: Secondary | ICD-10-CM | POA: Diagnosis not present

## 2021-05-02 DIAGNOSIS — Z7901 Long term (current) use of anticoagulants: Secondary | ICD-10-CM | POA: Insufficient documentation

## 2021-05-02 DIAGNOSIS — R5383 Other fatigue: Secondary | ICD-10-CM | POA: Insufficient documentation

## 2021-05-02 DIAGNOSIS — R161 Splenomegaly, not elsewhere classified: Secondary | ICD-10-CM | POA: Insufficient documentation

## 2021-05-02 DIAGNOSIS — Z801 Family history of malignant neoplasm of trachea, bronchus and lung: Secondary | ICD-10-CM | POA: Insufficient documentation

## 2021-05-02 DIAGNOSIS — E785 Hyperlipidemia, unspecified: Secondary | ICD-10-CM | POA: Insufficient documentation

## 2021-05-02 DIAGNOSIS — Z7984 Long term (current) use of oral hypoglycemic drugs: Secondary | ICD-10-CM | POA: Insufficient documentation

## 2021-05-02 DIAGNOSIS — I2699 Other pulmonary embolism without acute cor pulmonale: Secondary | ICD-10-CM | POA: Diagnosis not present

## 2021-05-02 DIAGNOSIS — Z86711 Personal history of pulmonary embolism: Secondary | ICD-10-CM | POA: Insufficient documentation

## 2021-05-02 DIAGNOSIS — D472 Monoclonal gammopathy: Secondary | ICD-10-CM | POA: Diagnosis not present

## 2021-05-02 DIAGNOSIS — E119 Type 2 diabetes mellitus without complications: Secondary | ICD-10-CM | POA: Insufficient documentation

## 2021-05-02 DIAGNOSIS — D72820 Lymphocytosis (symptomatic): Secondary | ICD-10-CM | POA: Diagnosis not present

## 2021-05-02 DIAGNOSIS — I252 Old myocardial infarction: Secondary | ICD-10-CM | POA: Diagnosis not present

## 2021-05-02 DIAGNOSIS — D693 Immune thrombocytopenic purpura: Secondary | ICD-10-CM | POA: Insufficient documentation

## 2021-05-02 DIAGNOSIS — D708 Other neutropenia: Secondary | ICD-10-CM | POA: Diagnosis not present

## 2021-05-02 DIAGNOSIS — I10 Essential (primary) hypertension: Secondary | ICD-10-CM | POA: Insufficient documentation

## 2021-05-02 DIAGNOSIS — I700 Atherosclerosis of aorta: Secondary | ICD-10-CM | POA: Insufficient documentation

## 2021-05-02 DIAGNOSIS — Z86718 Personal history of other venous thrombosis and embolism: Secondary | ICD-10-CM | POA: Diagnosis not present

## 2021-05-02 LAB — CBC WITH DIFFERENTIAL/PLATELET
Abs Immature Granulocytes: 0 10*3/uL (ref 0.00–0.07)
Basophils Absolute: 0 10*3/uL (ref 0.0–0.1)
Basophils Relative: 1 %
Eosinophils Absolute: 0.1 10*3/uL (ref 0.0–0.5)
Eosinophils Relative: 5 %
HCT: 39.6 % (ref 39.0–52.0)
Hemoglobin: 13.1 g/dL (ref 13.0–17.0)
Immature Granulocytes: 0 %
Lymphocytes Relative: 42 %
Lymphs Abs: 1 10*3/uL (ref 0.7–4.0)
MCH: 31.7 pg (ref 26.0–34.0)
MCHC: 33.1 g/dL (ref 30.0–36.0)
MCV: 95.9 fL (ref 80.0–100.0)
Monocytes Absolute: 0.5 10*3/uL (ref 0.1–1.0)
Monocytes Relative: 20 %
Neutro Abs: 0.8 10*3/uL — ABNORMAL LOW (ref 1.7–7.7)
Neutrophils Relative %: 32 %
Platelets: 157 10*3/uL (ref 150–400)
RBC: 4.13 MIL/uL — ABNORMAL LOW (ref 4.22–5.81)
RDW: 16.2 % — ABNORMAL HIGH (ref 11.5–15.5)
WBC: 2.4 10*3/uL — ABNORMAL LOW (ref 4.0–10.5)
nRBC: 0 % (ref 0.0–0.2)

## 2021-05-02 LAB — COMPREHENSIVE METABOLIC PANEL
ALT: 20 U/L (ref 0–44)
AST: 24 U/L (ref 15–41)
Albumin: 3.4 g/dL — ABNORMAL LOW (ref 3.5–5.0)
Alkaline Phosphatase: 99 U/L (ref 38–126)
Anion gap: 6 (ref 5–15)
BUN: 10 mg/dL (ref 8–23)
CO2: 28 mmol/L (ref 22–32)
Calcium: 9 mg/dL (ref 8.9–10.3)
Chloride: 102 mmol/L (ref 98–111)
Creatinine, Ser: 0.66 mg/dL (ref 0.61–1.24)
GFR, Estimated: >60 mL/min (ref >60)
Glucose, Bld: 109 mg/dL — ABNORMAL HIGH (ref 70–99)
Potassium: 3.6 mmol/L (ref 3.5–5.1)
Sodium: 136 mmol/L (ref 135–145)
Total Bilirubin: 0.4 mg/dL (ref 0.3–1.2)
Total Protein: 7.6 g/dL (ref 6.5–8.1)

## 2021-05-02 LAB — SAMPLE TO BLOOD BANK

## 2021-05-02 MED ORDER — ALPRAZOLAM 0.5 MG PO TABS: 0.5000 mg | ORAL_TABLET | Freq: Two times a day (BID) | ORAL | 0 refills | Status: DC | PRN

## 2021-05-02 NOTE — Progress Notes (Signed)
Hematology/Oncology Progress note [phone] [phone]     Patient Care Team: Earlie Server, MD as PCP - General (Oncology)   Name of the patient: Eric Joyce  [id number]  Mar 14, 1945   REASON FOR VISIT Follow-up for thrombocytopenia, splenomegaly  INTERVAL HISTORY 76 y.o. male presents for follow-up of thrombocytopenia, splenomegaly. #04/04/2021 - 04/12/2021 patient was hospitalized for generalized weakness, fever.  Developed melena and diarrhea after treatment of amoxicillin.  He has acute neutropenia, thrombocytopenia.  Patient had extensive work-up during the admission.  Respiratory viral panel negative, GI pathogen panel negative, ANA negative, Lyme disease panel negative, varicella negative, Bartonella negative.  Erlichia negative. CRP is elevated.  ESR is elevated, RPR negative, adequate vitamin B12 and folate.ANA is negative.   Normal fibrinogen.  Normal haptoglobin, Negative hepatitis and HIV.    Flow low cytometry with monoclonal B-cell lymphocytosis of undetermined significance Mildly elevated EBV PCR copies which were not clinically significant per infectious disease specialist.  CMV PCR is negative. PE/superficial venous thrombosis of the right great saphenous vein throughout the calf and thigh.  Anticoagulation was initially held due to severe thrombocytopenia. thrombocytopenia, finished 5 days of dexamethasone 40 mg daily and IVIG x 2 days.  Platelet improved to 49,000 and the patient was discharged on Eliquis 5 mg twice daily with no loading dose.  04/10/2021, bone marrow biopsy showed minor abnormal B-cell population identified, 2% of all cells. CD20, CD5 and CD200 positive.  Extremely dim/negative staining for surface immunoglobulin light chain.  Abundant T cells display nonspecific changes.  04/30/2021, PET scan showed similar moderate splenomegaly with background max SUV between mediastinal blood pool and liver.  No suspicious hypermetabolic splenic  foci.  Diffuse homogeneous might hypermetabolic bone marrow activity.  Nonspecific.  No hypermetabolic cervical/thoracic/abdominal pelvic adenopathy.  Aortic atherosclerosis.  Patient reports feeling well.  He takes Eliquis 5 mg twice daily and tolerates well.  No bleeding events.  Denies any shortness of breath, chest pain, leg swelling. He has not sleep well recently.  Review of Systems  Constitutional:  Positive for fatigue. Negative for appetite change, chills, fever and unexpected weight change.  HENT:   Negative for hearing loss and voice change.   Eyes:  Negative for eye problems and icterus.  Respiratory:  Negative for chest tightness, cough and shortness of breath.   Cardiovascular:  Negative for chest pain and leg swelling.  Gastrointestinal:  Negative for abdominal distention and abdominal pain.  Endocrine: Negative for hot flashes.  Genitourinary:  Negative for difficulty urinating, dysuria and frequency.   Musculoskeletal:  Negative for arthralgias.  Skin:  Negative for itching and rash.  Neurological:  Negative for light-headedness and numbness.  Hematological:  Negative for adenopathy. Does not bruise/bleed easily.  Psychiatric/Behavioral:  Positive for sleep disturbance. Negative for confusion.      Allergies  Allergen Reactions   Sulfamethoxazole-Trimethoprim      Past Medical History:  Diagnosis Date   Diabetes mellitus without complication ([place])    Hyperlipidemia    Hypertension    Myocardial infarction [facility]      Past Surgical History:  Procedure Laterality Date   APPENDECTOMY     CARDIAC SURGERY     2 stents   EYE SURGERY     REPLACEMENT TOTAL KNEE Right    TONSILLECTOMY AND ADENOIDECTOMY      Social History   Socioeconomic History   Marital status: Single    Spouse name: Not on file   Number of children: Not on file  Years of education: Not on file   Highest education level: Not on file  Occupational History   Not on file  Tobacco Use    Smoking status: Former    Types: Cigarettes   Smokeless tobacco: Never  Substance and Sexual Activity   Alcohol use: Not Currently   Drug use: Never   Sexual activity: Not on file  Other Topics Concern   Not on file  Social History Narrative   Not on file   Social Determinants of Health   Financial Resource Strain: Not on file  Food Insecurity: Not on file  Transportation Needs: Not on file  Physical Activity: Not on file  Stress: Not on file  Social Connections: Not on file  Intimate Partner Violence: Not on file    Family History  Problem Relation Age of Onset   Lung cancer Mother    Heart disease Father    Diabetes Paternal Uncle    Diabetes Paternal Grandmother      Current Outpatient Medications:    ALPRAZolam (XANAX) 0.5 MG tablet, Take 1 tablet (0.5 mg total) by mouth 2 (two) times daily as needed for anxiety or sleep., Disp: 60 tablet, Rfl: 0   apixaban (ELIQUIS) 5 MG TABS tablet, Take 1 tablet (5 mg total) by mouth 2 (two) times daily., Disp: 60 tablet, Rfl: 1   atorvastatin (LIPITOR) 40 MG tablet, Take 40 mg by mouth daily., Disp: , Rfl:    Cholecalciferol 125 MCG (5000 UT) TABS, Take 5,000 Units by mouth daily., Disp: , Rfl:    Coenzyme Q10 100 MG capsule, Take 100 mg by mouth daily., Disp: , Rfl:    Ensure Max Protein (ENSURE MAX PROTEIN) LIQD, Take 330 mLs (11 oz total) by mouth 2 (two) times daily., Disp: 1000 mL, Rfl: 12   gabapentin (NEURONTIN) 400 MG capsule, Take 400 mg by mouth 3 (three) times daily., Disp: , Rfl:    melatonin 3 MG TABS tablet, Take 6 mg by mouth at bedtime., Disp: , Rfl:    metFORMIN (GLUCOPHAGE) 500 MG tablet, Take 500 mg by mouth 2 (two) times daily with a meal., Disp: , Rfl:    metoprolol succinate (TOPROL-XL) 50 MG 24 hr tablet, Take 50 mg by mouth daily., Disp: , Rfl:    oxybutynin (DITROPAN-XL) 10 MG 24 hr tablet, Take 1 tablet (10 mg total) by mouth daily., Disp: 30 tablet, Rfl: 11   pantoprazole (PROTONIX) 40 MG tablet, Take 1  tablet (40 mg total) by mouth daily., Disp: 30 tablet, Rfl: 1   vitamin B-12 (CYANOCOBALAMIN) 1000 MCG tablet, Take 1,000 mcg by mouth daily., Disp: , Rfl:   Physical exam:  Vitals:   05/02/21 1020  BP: (!) 165/77  Pulse: 82  Temp: (!) 97.5 F (36.4 C)  TempSrc: Tympanic  Weight: 210 lb (95.3 kg)  Height: $Remove'6\' 1"'ZZsjwst$  (1.854 m)   Physical Exam Constitutional:      General: He is not in acute distress. HENT:     Head: Normocephalic and atraumatic.  Eyes:     General: No scleral icterus. Cardiovascular:     Rate and Rhythm: Normal rate and regular rhythm.     Heart sounds: Normal heart sounds.  Pulmonary:     Effort: Pulmonary effort is normal. No respiratory distress.     Breath sounds: No wheezing.  Abdominal:     General: Bowel sounds are normal. There is no distension.     Palpations: Abdomen is soft.  Musculoskeletal:  General: No deformity. Normal range of motion.     Cervical back: Normal range of motion and neck supple.  Skin:    General: Skin is warm and dry.     Findings: No erythema or rash.  Neurological:     Mental Status: He is alert and oriented to person, place, and time. Mental status is at baseline.     Cranial Nerves: No cranial nerve deficit.     Coordination: Coordination normal.  Psychiatric:        Mood and Affect: Mood normal.    CMP Latest Ref Rng & Units 05/02/2021  Glucose 70 - 99 mg/dL 109(H)  BUN 8 - 23 mg/dL 10  Creatinine 0.61 - 1.24 mg/dL 0.66  Sodium 135 - 145 mmol/L 136  Potassium 3.5 - 5.1 mmol/L 3.6  Chloride 98 - 111 mmol/L 102  CO2 22 - 32 mmol/L 28  Calcium 8.9 - 10.3 mg/dL 9.0  Total Protein 6.5 - 8.1 g/dL 7.6  Total Bilirubin 0.3 - 1.2 mg/dL 0.4  Alkaline Phos 38 - 126 U/L 99  AST 15 - 41 U/L 24  ALT 0 - 44 U/L 20   CBC Latest Ref Rng & Units 05/02/2021  WBC 4.0 - 10.5 K/uL 2.4(L)  Hemoglobin 13.0 - 17.0 g/dL 13.1  Hematocrit 39.0 - 52.0 % 39.6  Platelets 150 - 400 K/uL 157    RADIOGRAPHIC STUDIES: I have personally  reviewed the radiological images as listed and agreed with the findings in the report. DG Chest 2 View  Result Date: 04/04/2021 CLINICAL DATA:  Generalized weakness, fever and chills for more than 3 weeks. Black stool and copious diarrhea. EXAM: CHEST - 2 VIEW COMPARISON:  None. FINDINGS: Normal sized heart. Tortuous aorta. Clear lungs. Thoracic spine degenerative changes. IMPRESSION: No acute abnormality. Electronically Signed   By: Claudie Revering M.D.   On: 04/04/2021 17:06   CT Angio Chest Pulmonary Embolism (PE) W or WO Contrast  Result Date: 04/04/2021 CLINICAL DATA:  Weakness and fever for several weeks, initial encounter EXAM: CT ANGIOGRAPHY CHEST WITH CONTRAST TECHNIQUE: Multidetector CT imaging of the chest was performed using the standard protocol during bolus administration of intravenous contrast. Multiplanar CT image reconstructions and MIPs were obtained to evaluate the vascular anatomy. RADIATION DOSE REDUCTION: This exam was performed according to the departmental dose-optimization program which includes automated exposure control, adjustment of the mA and/or kV according to patient size and/or use of iterative reconstruction technique. CONTRAST:  8mL OMNIPAQUE IOHEXOL 350 MG/ML SOLN COMPARISON:  None. FINDINGS: Cardiovascular: Thoracic aorta shows a normal branching pattern. No aneurysmal dilatation or dissection is noted. No cardiac enlargement is seen. Coronary calcifications are noted. The pulmonary artery shows a normal branching pattern bilaterally. Mild right lower lobe pulmonary emboli are seen posteriorly. No right heart strain is noted. Mediastinum/Nodes: Thoracic inlet is within normal limits. No sizable hilar or mediastinal adenopathy is noted. The esophagus is within normal limits. Lungs/Pleura: Lungs demonstrate mild dependent atelectatic changes. No sizable parenchymal nodules are noted. Upper Abdomen: Visualized upper abdomen is within normal limits. Musculoskeletal: Degenerative  changes of the thoracic spine are noted. No acute rib abnormality is noted. Review of the MIP images confirms the above findings. IMPRESSION: Changes consistent with right lower lobe pulmonary emboli. No right heart strain is noted. Mild dependent atelectatic changes. Critical Value/emergent results were communicated the Amion at the time of interpretation on 04/04/2021 at 10:28 Pm to Dr. Florina Ou , who verbally acknowledged these results. Electronically Signed   By:  Inez Catalina M.D.   On: 04/04/2021 22:32   CT ABDOMEN PELVIS W CONTRAST  Result Date: 04/04/2021 CLINICAL DATA:  Weakness, fever, chills for 3 weeks, dark stool, diarrhea EXAM: CT ABDOMEN AND PELVIS WITH CONTRAST TECHNIQUE: Multidetector CT imaging of the abdomen and pelvis was performed using the standard protocol following bolus administration of intravenous contrast. RADIATION DOSE REDUCTION: This exam was performed according to the departmental dose-optimization program which includes automated exposure control, adjustment of the mA and/or kV according to patient size and/or use of iterative reconstruction technique. CONTRAST:  174mL OMNIPAQUE IOHEXOL 300 MG/ML  SOLN COMPARISON:  None. FINDINGS: Lower chest: No acute pleural or parenchymal lung disease. Hepatobiliary: No focal liver abnormality is seen. No gallstones, gallbladder wall thickening, or biliary dilatation. Pancreas: Unremarkable. No pancreatic ductal dilatation or surrounding inflammatory changes. Spleen: The spleen is enlarged measuring 15.5 x 15.0 x 5.7 cm. No focal parenchymal abnormalities. Adrenals/Urinary Tract: There are 2 sub 5 mm nonobstructing calculi within the lower pole left kidney. No right-sided calculi. Small bilateral renal cortical cysts. No obstructive uropathy. Bladder is unremarkable. The adrenals are normal. Stomach/Bowel: No bowel obstruction or ileus. Scattered diverticulosis of the sigmoid colon without evidence of diverticulitis. Mild circumferential rectal  wall thickening. There is a 1.7 x 1.2 cm low-attenuation region dorsal to the anal verge reference image 100/3, which could reflect a small perirectal abscess. Please correlate with physical exam findings. No inflammatory changes. Vascular/Lymphatic: Aortic atherosclerosis. No enlarged abdominal or pelvic lymph nodes. Numerous subcentimeter mesenteric and retroperitoneal lymph nodes are nonspecific. Reproductive: The prostate is mildly enlarged. Surgical clips are seen within the ventral aspect of the prostate. Other: No free fluid or free intraperitoneal gas. There is a small fat containing umbilical hernia. No bowel herniation. Musculoskeletal: No acute or destructive bony lesions. Reconstructed images demonstrate no additional findings. IMPRESSION: 1. Mild nonspecific circumferential rectal wall thickening, with possible small perirectal abscess dorsal to the anal verge. Please correlate with physical exam findings. 2. Distal colonic diverticulosis without diverticulitis. 3. Nonobstructing less than 5 mm left renal calculi. 4. Splenomegaly. 5. Small fat containing umbilical hernia.  No bowel herniation. 6.  Aortic Atherosclerosis (ICD10-I70.0). Electronically Signed   By: Randa Ngo M.D.   On: 04/04/2021 17:50   NM PET Image Initial (PI) Skull Base To Thigh  Result Date: 05/01/2021 CLINICAL DATA:  Initial treatment strategy for possible CLL. EXAM: NUCLEAR MEDICINE PET SKULL BASE TO THIGH TECHNIQUE: 9.9 mCi F-18 FDG was injected intravenously. Full-ring PET imaging was performed from the skull base to thigh after the radiotracer. CT data was obtained and used for attenuation correction and anatomic localization. Fasting blood glucose: 125 mg/dl COMPARISON:  CT April 04, 2021 FINDINGS: Mediastinal blood pool activity: SUV max 2.2 Liver activity: SUV max 3.2 NECK: No hypermetabolic cervical or supraclavicular lymph nodes. Incidental CT findings: No cervical lymphadenopathy. Streak artifact from dental  hardware. No suspicious thyroid nodule. CHEST: No hypermetabolic mediastinal, hilar or axillary nodes. Patchy hypermetabolic activity in the right greater than left lung bases, likely reflects atelectasis. Incidental CT findings: No pathologically enlarged thoracic lymph nodes. Aortic atherosclerosis without aneurysmal dilation. Coronary artery calcifications. Normal size heart. No significant pericardial effusion/thickening. Hypoventilatory change in the dependent lungs. ABDOMEN/PELVIS: Similar mild splenomegaly, measuring 14.3 cm in maximum axial dimension, with a background max SUV of splenic activity measuring 2.8. No abnormal foci of splenic hypermetabolism. No abnormal hypermetabolic activity within the liver, pancreas, or adrenal glands. No hypermetabolic lymph nodes in the abdomen or pelvis. Incidental CT findings:  No abdominopelvic adenopathy. Unremarkable noncontrast appearance of the liver, gallbladder and pancreas. No hydronephrosis. Nonobstructive left renal calculi. Small bilateral renal cysts. No bowel obstruction. Colonic diverticulosis without findings of acute diverticulitis. Small fat containing paraumbilical hernia. SKELETON: Diffuse homogeneous mild hypermetabolic osseous marrow activity with a max SUV in the L4 vertebral body of 3.4. No suspicious abnormal foci of hypermetabolic osseous activity. Incidental CT findings: No aggressive lytic or blastic lesion of bone. Multilevel degenerative changes spine. Post bone marrow biopsy change in the right iliac bone. IMPRESSION: 1. Similar mild splenomegaly with background max SUV between mediastinal blood pool and liver, and no suspicious hypermetabolic splenic foci. 2. Diffuse homogeneous mild hypermetabolic bone marrow activity, nonspecific possibly reflecting disease involvement. 3. No hypermetabolic cervical, thoracic or abdominopelvic adenopathy. 4.   Aortic Atherosclerosis (ICD10-I70.0). Electronically Signed   By: Dahlia Bailiff M.D.   On:  05/01/2021 11:23   US Venous Img Lower Bilateral (DVT)  Result Date: 04/06/2021 CLINICAL DATA:  76 year old male with history of pulmonary embolism. EXAM: BILATERAL LOWER EXTREMITY VENOUS DOPPLER ULTRASOUND TECHNIQUE: Gray-scale sonography with graded compression, as well as color Doppler and duplex ultrasound were performed to evaluate the lower extremity deep venous systems from the level of the common femoral vein and including the common femoral, femoral, profunda femoral, popliteal and calf veins including the posterior tibial, peroneal and gastrocnemius veins when visible. The superficial great saphenous vein was also interrogated. Spectral Doppler was utilized to evaluate flow at rest and with distal augmentation maneuvers in the common femoral, femoral and popliteal veins. COMPARISON:  None. FINDINGS: RIGHT LOWER EXTREMITY Common Femoral Vein: No evidence of thrombus. Normal compressibility, respiratory phasicity and response to augmentation. Saphenofemoral Junction: No evidence of thrombus. Normal compressibility and flow on color Doppler imaging. Profunda Femoral Vein: No evidence of thrombus. Normal compressibility and flow on color Doppler imaging. Femoral Vein: No evidence of thrombus. Normal compressibility, respiratory phasicity and response to augmentation. Popliteal Vein: No evidence of thrombus. Normal compressibility, respiratory phasicity and response to augmentation. Calf Veins: No evidence of thrombus. Normal compressibility and flow on color Doppler imaging. Superficial Great Saphenous Vein: Occlusive thrombus throughout the thigh and the calf to approximately 2.7 cm peripheral to the saphenofemoral junction. Other Findings:  None. LEFT LOWER EXTREMITY Common Femoral Vein: No evidence of thrombus. Normal compressibility, respiratory phasicity and response to augmentation. Saphenofemoral Junction: No evidence of thrombus. Normal compressibility and flow on color Doppler imaging. Profunda  Femoral Vein: No evidence of thrombus. Normal compressibility and flow on color Doppler imaging. Femoral Vein: No evidence of thrombus. Normal compressibility, respiratory phasicity and response to augmentation. Popliteal Vein: No evidence of thrombus. Normal compressibility, respiratory phasicity and response to augmentation. Calf Veins: No evidence of thrombus. Normal compressibility and flow on color Doppler imaging. Superficial Great Saphenous Vein: No evidence of thrombus. Normal compressibility. Other Findings:  None. IMPRESSION: 1. Superficial venous thrombosis of the right greater saphenous vein throughout the calf and thigh. No evidence of suppurative thrombophlebitis. 2. No evidence of bilateral lower extremity deep vein thrombosis. Ruthann Cancer, MD [facility] Electronically Signed   By: Ruthann Cancer M.D.   On: 04/06/2021 09:15   US ARTERIAL ABI (SCREENING LOWER EXTREMITY)  Result Date: 04/05/2021 CLINICAL DATA:  Decreased pedal pulses. EXAM: NONINVASIVE PHYSIOLOGIC VASCULAR STUDY OF BILATERAL LOWER EXTREMITIES TECHNIQUE: Evaluation of both lower extremities were performed at rest, including calculation of ankle-brachial indices with single level Doppler, pressure and pulse volume recording. COMPARISON:  None. FINDINGS: Right ABI:  1.15 Left ABI:  1.26 Right Lower Extremity:  Irregular waveforms. Left Lower Extremity:  Normal arterial waveforms at the ankle. 1.0-1.4 Normal IMPRESSION: Normal resting ankle-brachial indices bilaterally. Electronically Signed   By: Markus Daft M.D.   On: 04/05/2021 08:44   CT Maxillofacial W Contrast  Result Date: 04/04/2021 CLINICAL DATA:  Bilateral TMJ with limited movement. Mouth pain. Possible malignancy. Fever and chills 3 weeks. EXAM: CT MAXILLOFACIAL WITH CONTRAST TECHNIQUE: Multidetector CT imaging of the maxillofacial structures was performed with intravenous contrast. Multiplanar CT image  reconstructions were also generated. RADIATION DOSE REDUCTION: This exam was performed according to the departmental dose-optimization program which includes automated exposure control, adjustment of the mA and/or kV according to patient size and/or use of iterative reconstruction technique. CONTRAST:  117mL OMNIPAQUE IOHEXOL 300 MG/ML  SOLN COMPARISON:  None. FINDINGS: Osseous: Normal TMJ bilaterally. Normal alignment and no degenerative change. No lesion in the mandible. Mild periapical lucency around upper incisors bilaterally. Orbits: Negative for orbital mass or edema. Bilateral cataract extraction. Sinuses: Mucosal edema filling much of the left sphenoid sinus with bony thickening of the left sphenoid sinus. Remaining sinuses clear. Mastoid and middle ear clear bilaterally. Soft tissues: No soft tissue mass. No evidence of cellulitis or soft tissue abscess. Submandibular parotid glands normal bilaterally. Normal pharynx. Limited thyroid negative Limited intracranial: No acute abnormality. IMPRESSION: Normal TMJ bilaterally.  No dislocation or degenerative change. Small periapical lucency around upper incisors bilaterally compatible with dental infection. No evidence of soft tissue cellulitis or soft tissue abscess. No evidence of mass or malignancy Mucosal edema and bony thickening left sphenoid sinus. Electronically Signed   By: Franchot Gallo M.D.   On: 04/04/2021 17:59   CT BONE MARROW BIOPSY & ASPIRATION  Result Date: 04/10/2021 INDICATION: Thrombocytopenia EXAM: CT GUIDED RIGHT ILIAC BONE MARROW ASPIRATION AND CORE BIOPSY Date:  04/10/2021 04/10/2021 11:00 am Radiologist:  M. Daryll Brod, MD Guidance:  CT FLUOROSCOPY: Fluoroscopy Time: None. MEDICATIONS: 1% lidocaine local ANESTHESIA/SEDATION: 1.0 mg IV Versed; 100 mcg IV Fentanyl Moderate Sedation Time:  10 minute The patient was continuously monitored during the procedure by the interventional radiology nurse under my direct supervision. CONTRAST:   None. COMPLICATIONS: None PROCEDURE: Informed consent was obtained from the patient following explanation of the procedure, risks, benefits and alternatives. The patient understands, agrees and consents for the procedure. All questions were addressed. A time out was performed. The patient was positioned prone and non-contrast localization CT was performed of the pelvis to demonstrate the iliac marrow spaces. Maximal barrier sterile technique utilized including caps, mask, sterile gowns, sterile gloves, large sterile drape, hand hygiene, and Betadine prep. Under sterile conditions and local anesthesia, an 11 gauge coaxial bone biopsy needle was advanced into the right iliac marrow space. Needle position was confirmed with CT imaging. Initially, bone marrow aspiration was performed. Next, the 11 gauge outer cannula was utilized to obtain a right iliac bone marrow core biopsy. Needle was removed. Hemostasis was obtained with compression. The patient tolerated the procedure well. Samples were prepared with the cytotechnologist. No immediate complications. IMPRESSION: CT guided right iliac bone marrow aspiration and core biopsy. Electronically Signed   By: Jerilynn Mages.  Shick M.D.   On: 04/10/2021 11:07   US Abdomen Limited RUQ (LIVER/GB)  Result Date: 04/04/2021 CLINICAL DATA:  Elevated LFTs EXAM: ULTRASOUND ABDOMEN LIMITED RIGHT UPPER QUADRANT COMPARISON:  None. FINDINGS: Gallbladder: Moderate to large amount of echogenic sludge. No shadowing calculi visualized. No significant wall thickening or pericholecystic edema. Negative sonographic Murphy's sign.  Common bile duct: Diameter: 3 mm Liver: No focal lesion identified. Within normal limits in parenchymal echogenicity. Portal vein is patent on color Doppler imaging with normal direction of blood flow towards the liver. Other: None. IMPRESSION: Gallbladder sludge. Electronically Signed   By: Ofilia Neas M.D.   On: 04/04/2021 20:51     Assessment and plan  1. Acute  ITP (HCC)   2. Splenomegaly   3. Monoclonal B-cell lymphocytosis   4. Other acute pulmonary embolism without acute cor pulmonale (HCC)   5. Other neutropenia (HCC)    #Acute ITP, status post steroid course and IVIG x2 Platelet count has completely normalized and remained stable at 157,000. Close monitor.  #Splenomegaly, unknown etiology.  04/30/2021, PET scan showed similar mild splenomegaly with low FDG activity.  No suspicious hypermetabolic splenic foci, adenopathy. Likely reactive to infection.  #Monoclonal B-cell lymphocytosis.  Bone marrow biopsy reports were reviewed and discussed with patient.  2% clonal B cells.  Close monitor.  #Provoked acute pulmonary embolism Recommend to complete anticoagulation for 3 to 6 months.  #Neutropenia, recurrent.  ANC improved during hospitalization after antibiotics and steroid treatments.  WBC 2.4, with ANC of 0.8.  Etiology unknown.  Discussed with patient about neutropenia precaution.  Repeat CBC in 1 week.   Orders Placed This Encounter  Procedures   CBC with Differential/Platelet    Standing Status:   Future    Standing Expiration Date:   02/01/2022   Comprehensive metabolic panel    Standing Status:   Future    Standing Expiration Date:   02/01/2022   CBC with Differential/Platelet    Standing Status:   Future    Standing Expiration Date:   02/01/2022      Follow-up in 2 months.  Earlie Server, MD, PhD [facility] Hematology Oncology 05/02/2021

## 2021-05-02 NOTE — Progress Notes (Signed)
Patient here for follow up. Patient denies any concerns. °

## 2021-05-09 ENCOUNTER — Inpatient Hospital Stay: Payer: PPO

## 2021-05-09 ENCOUNTER — Other Ambulatory Visit: Payer: Self-pay

## 2021-05-09 DIAGNOSIS — D708 Other neutropenia: Secondary | ICD-10-CM

## 2021-05-09 DIAGNOSIS — D693 Immune thrombocytopenic purpura: Secondary | ICD-10-CM

## 2021-05-09 DIAGNOSIS — I2699 Other pulmonary embolism without acute cor pulmonale: Secondary | ICD-10-CM

## 2021-05-09 DIAGNOSIS — D72820 Lymphocytosis (symptomatic): Secondary | ICD-10-CM

## 2021-05-09 DIAGNOSIS — R161 Splenomegaly, not elsewhere classified: Secondary | ICD-10-CM

## 2021-05-09 LAB — CBC WITH DIFFERENTIAL/PLATELET
Abs Immature Granulocytes: 0.01 10*3/uL (ref 0.00–0.07)
Basophils Absolute: 0 10*3/uL (ref 0.0–0.1)
Basophils Relative: 0 %
Eosinophils Absolute: 0.1 10*3/uL (ref 0.0–0.5)
Eosinophils Relative: 3 %
HCT: 41.5 % (ref 39.0–52.0)
Hemoglobin: 13.9 g/dL (ref 13.0–17.0)
Immature Granulocytes: 0 %
Lymphocytes Relative: 36 %
Lymphs Abs: 1.1 10*3/uL (ref 0.7–4.0)
MCH: 32 pg (ref 26.0–34.0)
MCHC: 33.5 g/dL (ref 30.0–36.0)
MCV: 95.6 fL (ref 80.0–100.0)
Monocytes Absolute: 0.4 10*3/uL (ref 0.1–1.0)
Monocytes Relative: 13 %
Neutro Abs: 1.5 10*3/uL — ABNORMAL LOW (ref 1.7–7.7)
Neutrophils Relative %: 48 %
Platelets: 149 10*3/uL — ABNORMAL LOW (ref 150–400)
RBC: 4.34 MIL/uL (ref 4.22–5.81)
RDW: 15.3 % (ref 11.5–15.5)
WBC: 3.1 10*3/uL — ABNORMAL LOW (ref 4.0–10.5)
nRBC: 0 % (ref 0.0–0.2)

## 2021-05-10 LAB — ANCA TITERS
Atypical P-ANCA titer: <1:20 {titer}
C-ANCA: <1:20 {titer}
P-ANCA: <1:20 {titer}

## 2021-05-10 LAB — C4 COMPLEMENT: Complement C4, Body Fluid: 22 mg/dL (ref 12–38)

## 2021-05-10 LAB — C3 COMPLEMENT: C3 Complement: 170 mg/dL — ABNORMAL HIGH (ref 82–167)

## 2021-06-01 DIAGNOSIS — D696 Thrombocytopenia, unspecified: Secondary | ICD-10-CM | POA: Diagnosis not present

## 2021-06-01 DIAGNOSIS — E785 Hyperlipidemia, unspecified: Secondary | ICD-10-CM | POA: Diagnosis not present

## 2021-06-01 DIAGNOSIS — I2699 Other pulmonary embolism without acute cor pulmonale: Secondary | ICD-10-CM | POA: Diagnosis not present

## 2021-06-01 DIAGNOSIS — I2510 Atherosclerotic heart disease of native coronary artery without angina pectoris: Secondary | ICD-10-CM | POA: Diagnosis not present

## 2021-06-06 ENCOUNTER — Other Ambulatory Visit: Payer: Self-pay | Admitting: *Deleted

## 2021-06-06 MED ORDER — APIXABAN 5 MG PO TABS: 5.0000 mg | ORAL_TABLET | Freq: Two times a day (BID) | ORAL | 0 refills | Status: DC

## 2021-06-06 MED ORDER — ALPRAZOLAM 0.5 MG PO TABS: 0.5000 mg | ORAL_TABLET | Freq: Two times a day (BID) | ORAL | 0 refills | Status: DC | PRN

## 2021-07-02 ENCOUNTER — Encounter: Payer: Self-pay | Admitting: Oncology

## 2021-07-02 ENCOUNTER — Inpatient Hospital Stay: Payer: PPO | Admitting: Oncology

## 2021-07-02 ENCOUNTER — Inpatient Hospital Stay: Payer: PPO | Attending: Oncology

## 2021-07-02 VITALS — BP 137/86 | HR 66 | Temp 97.8°F | Wt 232.0 lb

## 2021-07-02 DIAGNOSIS — Z7901 Long term (current) use of anticoagulants: Secondary | ICD-10-CM | POA: Insufficient documentation

## 2021-07-02 DIAGNOSIS — R161 Splenomegaly, not elsewhere classified: Secondary | ICD-10-CM | POA: Diagnosis not present

## 2021-07-02 DIAGNOSIS — Z79899 Other long term (current) drug therapy: Secondary | ICD-10-CM | POA: Insufficient documentation

## 2021-07-02 DIAGNOSIS — Z86711 Personal history of pulmonary embolism: Secondary | ICD-10-CM | POA: Insufficient documentation

## 2021-07-02 DIAGNOSIS — I10 Essential (primary) hypertension: Secondary | ICD-10-CM | POA: Insufficient documentation

## 2021-07-02 DIAGNOSIS — K921 Melena: Secondary | ICD-10-CM | POA: Diagnosis not present

## 2021-07-02 DIAGNOSIS — D708 Other neutropenia: Secondary | ICD-10-CM | POA: Diagnosis not present

## 2021-07-02 DIAGNOSIS — R197 Diarrhea, unspecified: Secondary | ICD-10-CM | POA: Insufficient documentation

## 2021-07-02 DIAGNOSIS — Z7984 Long term (current) use of oral hypoglycemic drugs: Secondary | ICD-10-CM | POA: Insufficient documentation

## 2021-07-02 DIAGNOSIS — Z862 Personal history of diseases of the blood and blood-forming organs and certain disorders involving the immune mechanism: Secondary | ICD-10-CM | POA: Diagnosis not present

## 2021-07-02 DIAGNOSIS — D72820 Lymphocytosis (symptomatic): Secondary | ICD-10-CM

## 2021-07-02 DIAGNOSIS — E119 Type 2 diabetes mellitus without complications: Secondary | ICD-10-CM | POA: Diagnosis not present

## 2021-07-02 DIAGNOSIS — E785 Hyperlipidemia, unspecified: Secondary | ICD-10-CM | POA: Insufficient documentation

## 2021-07-02 DIAGNOSIS — D693 Immune thrombocytopenic purpura: Secondary | ICD-10-CM | POA: Insufficient documentation

## 2021-07-02 DIAGNOSIS — I252 Old myocardial infarction: Secondary | ICD-10-CM | POA: Insufficient documentation

## 2021-07-02 DIAGNOSIS — I2699 Other pulmonary embolism without acute cor pulmonale: Secondary | ICD-10-CM

## 2021-07-02 DIAGNOSIS — Z87891 Personal history of nicotine dependence: Secondary | ICD-10-CM | POA: Insufficient documentation

## 2021-07-02 DIAGNOSIS — Z801 Family history of malignant neoplasm of trachea, bronchus and lung: Secondary | ICD-10-CM | POA: Diagnosis not present

## 2021-07-02 LAB — CBC WITH DIFFERENTIAL/PLATELET
Abs Immature Granulocytes: 0.02 10*3/uL (ref 0.00–0.07)
Basophils Absolute: 0 10*3/uL (ref 0.0–0.1)
Basophils Relative: 0 %
Eosinophils Absolute: 0.2 10*3/uL (ref 0.0–0.5)
Eosinophils Relative: 2 %
HCT: 44.9 % (ref 39.0–52.0)
Hemoglobin: 15.2 g/dL (ref 13.0–17.0)
Immature Granulocytes: 0 %
Lymphocytes Relative: 26 %
Lymphs Abs: 2 10*3/uL (ref 0.7–4.0)
MCH: 31.5 pg (ref 26.0–34.0)
MCHC: 33.9 g/dL (ref 30.0–36.0)
MCV: 93 fL (ref 80.0–100.0)
Monocytes Absolute: 0.5 10*3/uL (ref 0.1–1.0)
Monocytes Relative: 7 %
Neutro Abs: 4.9 10*3/uL (ref 1.7–7.7)
Neutrophils Relative %: 65 %
Platelets: 164 10*3/uL (ref 150–400)
RBC: 4.83 MIL/uL (ref 4.22–5.81)
RDW: 12.9 % (ref 11.5–15.5)
WBC: 7.6 10*3/uL (ref 4.0–10.5)
nRBC: 0 % (ref 0.0–0.2)

## 2021-07-02 LAB — COMPREHENSIVE METABOLIC PANEL
ALT: 24 U/L (ref 0–44)
AST: 28 U/L (ref 15–41)
Albumin: 4.3 g/dL (ref 3.5–5.0)
Alkaline Phosphatase: 81 U/L (ref 38–126)
Anion gap: 6 (ref 5–15)
BUN: 14 mg/dL (ref 8–23)
CO2: 30 mmol/L (ref 22–32)
Calcium: 9.4 mg/dL (ref 8.9–10.3)
Chloride: 101 mmol/L (ref 98–111)
Creatinine, Ser: 0.73 mg/dL (ref 0.61–1.24)
GFR, Estimated: >60 mL/min (ref >60)
Glucose, Bld: 182 mg/dL — ABNORMAL HIGH (ref 70–99)
Potassium: 3.5 mmol/L (ref 3.5–5.1)
Sodium: 137 mmol/L (ref 135–145)
Total Bilirubin: 0.7 mg/dL (ref 0.3–1.2)
Total Protein: 7.5 g/dL (ref 6.5–8.1)

## 2021-07-02 MED ORDER — APIXABAN 5 MG PO TABS: 5.0000 mg | ORAL_TABLET | Freq: Two times a day (BID) | ORAL | 2 refills | Status: DC

## 2021-07-02 NOTE — Progress Notes (Signed)
? ? ?Hematology/Oncology Progress note ?[phone] [phone] ?  ? ? ?Patient Care Team: ?Earlie Server, MD as PCP - General (Oncology)  ? ?Name of the patient: Eric Joyce  ?[id number]  ?05-Jan-1946  ? ?REASON FOR VISIT ?Follow-up for thrombocytopenia, splenomegaly ? ?INTERVAL HISTORY ?76 y.o. male presents for follow-up of thrombocytopenia, splenomegaly. ?#04/04/2021 - 04/12/2021 patient was hospitalized for generalized weakness, fever.  Developed melena and diarrhea after treatment of amoxicillin.  He has acute neutropenia, thrombocytopenia.  ?Patient had extensive work-up during the admission. ? Respiratory viral panel negative, GI pathogen panel negative, ANA negative, Lyme disease panel negative, varicella negative, Bartonella negative.  Erlichia negative. ?CRP is elevated.  ESR is elevated, RPR negative, adequate vitamin B12 and folate.ANA is negative.   ?Normal fibrinogen.  Normal haptoglobin, Negative hepatitis and HIV.   ? ?Flow low cytometry with monoclonal B-cell lymphocytosis of undetermined significance ?Mildly elevated EBV PCR copies which were not clinically significant per infectious disease specialist.  CMV PCR is negative. ?PE/superficial venous thrombosis of the right great saphenous vein throughout the calf and thigh.  Anticoagulation was initially held due to severe thrombocytopenia. ?thrombocytopenia, finished 5 days of dexamethasone 40 mg daily and IVIG x 2 days.  Platelet improved to 49,000 and the patient was discharged on Eliquis 5 mg twice daily with no loading dose. ? ?04/10/2021, bone marrow biopsy showed minor abnormal B-cell population identified, 2% of all cells. ?CD20, CD5 and CD200 positive.  Extremely dim/negative staining for surface immunoglobulin light chain.  Abundant T cells display nonspecific changes. ? ?04/30/2021, PET scan showed similar moderate splenomegaly with background max SUV between mediastinal blood pool and liver.  No suspicious hypermetabolic splenic  foci.  Diffuse homogeneous might hypermetabolic bone marrow activity.  Nonspecific.  No hypermetabolic cervical/thoracic/abdominal pelvic adenopathy.  Aortic atherosclerosis. ? ?Patient reports feeling well.  He takes Eliquis 5 mg twice daily and tolerates well.  No bleeding events.  Denies any shortness of breath, chest pain, leg swelling. ?He has not sleep well recently. ? ?INTERVAL HISTORY ?Eric Joyce is a 76 y.o. male who has above history reviewed by me today presents for follow up visit for management of history of ITP, monoclonal B-cell lymphocytosis. ?Patient reports feeling well today.  Denies any unintentional weight loss, fever, night sweats.  Denies any easy bruising, active bleeding events. ? ? ?Review of Systems  ?Constitutional:  Negative for appetite change, chills, fatigue, fever and unexpected weight change.  ?HENT:   Negative for hearing loss and voice change.   ?Eyes:  Negative for eye problems and icterus.  ?Respiratory:  Negative for chest tightness, cough and shortness of breath.   ?Cardiovascular:  Negative for chest pain and leg swelling.  ?Gastrointestinal:  Negative for abdominal distention and abdominal pain.  ?Endocrine: Negative for hot flashes.  ?Genitourinary:  Negative for difficulty urinating, dysuria and frequency.   ?Musculoskeletal:  Negative for arthralgias.  ?Skin:  Negative for itching and rash.  ?Neurological:  Negative for light-headedness and numbness.  ?Hematological:  Negative for adenopathy. Does not bruise/bleed easily.  ?Psychiatric/Behavioral:  Negative for confusion.    ? ? ?Allergies  ?Allergen Reactions  ? Sulfamethoxazole-Trimethoprim   ? ? ? ?Past Medical History:  ?Diagnosis Date  ? Diabetes mellitus without complication ([place])   ? Hyperlipidemia   ? Hypertension   ? Myocardial infarction [facility])   ? ? ? ?Past Surgical History:  ?Procedure Laterality Date  ? APPENDECTOMY    ? CARDIAC SURGERY    ? 2 stents  ?  EYE SURGERY    ? REPLACEMENT TOTAL KNEE Right   ?  TONSILLECTOMY AND ADENOIDECTOMY    ? ? ?Social History  ? ?Socioeconomic History  ? Marital status: Single  ?  Spouse name: Not on file  ? Number of children: Not on file  ? Years of education: Not on file  ? Highest education level: Not on file  ?Occupational History  ? Not on file  ?Tobacco Use  ? Smoking status: Former  ?  Types: Cigarettes  ? Smokeless tobacco: Never  ?Substance and Sexual Activity  ? Alcohol use: Not Currently  ? Drug use: Never  ? Sexual activity: Not on file  ?Other Topics Concern  ? Not on file  ?Social History Narrative  ? Not on file  ? ?Social Determinants of Health  ? ?Financial Resource Strain: Not on file  ?Food Insecurity: Not on file  ?Transportation Needs: Not on file  ?Physical Activity: Not on file  ?Stress: Not on file  ?Social Connections: Not on file  ?Intimate Partner Violence: Not on file  ? ? ?Family History  ?Problem Relation Age of Onset  ? Lung cancer Mother   ? Heart disease Father   ? Diabetes Paternal Uncle   ? Diabetes Paternal Grandmother   ? ? ? ?Current Outpatient Medications:  ?  ALPRAZolam (XANAX) 0.5 MG tablet, Take 1 tablet (0.5 mg total) by mouth 2 (two) times daily as needed for anxiety or sleep., Disp: 60 tablet, Rfl: 0 ?  atorvastatin (LIPITOR) 40 MG tablet, Take 40 mg by mouth daily., Disp: , Rfl:  ?  Cholecalciferol 125 MCG (5000 UT) TABS, Take 5,000 Units by mouth daily., Disp: , Rfl:  ?  Coenzyme Q10 100 MG capsule, Take 100 mg by mouth daily., Disp: , Rfl:  ?  Ensure Max Protein (ENSURE MAX PROTEIN) LIQD, Take 330 mLs (11 oz total) by mouth 2 (two) times daily., Disp: 1000 mL, Rfl: 12 ?  gabapentin (NEURONTIN) 400 MG capsule, Take 400 mg by mouth 3 (three) times daily., Disp: , Rfl:  ?  melatonin 3 MG TABS tablet, Take 6 mg by mouth at bedtime., Disp: , Rfl:  ?  metFORMIN (GLUCOPHAGE) 500 MG tablet, Take 500 mg by mouth 2 (two) times daily with a meal., Disp: , Rfl:  ?  metoprolol succinate (TOPROL-XL) 50 MG 24 hr tablet, Take 50 mg by mouth daily.,  Disp: , Rfl:  ?  oxybutynin (DITROPAN-XL) 10 MG 24 hr tablet, Take 1 tablet (10 mg total) by mouth daily., Disp: 30 tablet, Rfl: 11 ?  pantoprazole (PROTONIX) 40 MG tablet, Take 1 tablet (40 mg total) by mouth daily., Disp: 30 tablet, Rfl: 1 ?  vitamin B-12 (CYANOCOBALAMIN) 1000 MCG tablet, Take 1,000 mcg by mouth daily., Disp: , Rfl:  ?  apixaban (ELIQUIS) 5 MG TABS tablet, Take 1 tablet (5 mg total) by mouth 2 (two) times daily., Disp: 60 tablet, Rfl: 2 ? ?Physical exam:  ?Vitals:  ? 07/02/21 0952  ?BP: 137/86  ?Pulse: 66  ?Temp: 97.8 ?F (36.6 ?C)  ?TempSrc: Tympanic  ?Weight: 232 lb (105.2 kg)  ? ?Physical Exam ?Constitutional:   ?   General: He is not in acute distress. ?HENT:  ?   Head: Normocephalic and atraumatic.  ?Eyes:  ?   General: No scleral icterus. ?Cardiovascular:  ?   Rate and Rhythm: Normal rate and regular rhythm.  ?   Heart sounds: Normal heart sounds.  ?Pulmonary:  ?   Effort: Pulmonary effort is  normal. No respiratory distress.  ?   Breath sounds: No wheezing.  ?Abdominal:  ?   General: Bowel sounds are normal. There is no distension.  ?   Palpations: Abdomen is soft.  ?Musculoskeletal:     ?   General: No deformity. Normal range of motion.  ?   Cervical back: Normal range of motion and neck supple.  ?Skin: ?   General: Skin is warm and dry.  ?   Findings: No erythema or rash.  ?Neurological:  ?   Mental Status: He is alert and oriented to person, place, and time. Mental status is at baseline.  ?   Cranial Nerves: No cranial nerve deficit.  ?   Coordination: Coordination normal.  ?Psychiatric:     ?   Mood and Affect: Mood normal.  ? ? ? ?  Latest Ref Rng & Units 07/02/2021  ?  9:44 AM  ?CMP  ?Glucose 70 - 99 mg/dL 182    ?BUN 8 - 23 mg/dL 14    ?Creatinine 0.61 - 1.24 mg/dL 0.73    ?Sodium 135 - 145 mmol/L 137    ?Potassium 3.5 - 5.1 mmol/L 3.5    ?Chloride 98 - 111 mmol/L 101    ?CO2 22 - 32 mmol/L 30    ?Calcium 8.9 - 10.3 mg/dL 9.4    ?Total Protein 6.5 - 8.1 g/dL 7.5    ?Total Bilirubin 0.3 -  1.2 mg/dL 0.7    ?Alkaline Phos 38 - 126 U/L 81    ?AST 15 - 41 U/L 28    ?ALT 0 - 44 U/L 24    ? ? ?  Latest Ref Rng & Units 07/02/2021  ?  9:44 AM  ?CBC  ?WBC 4.0 - 10.5 K/uL 7.6    ?Hemoglobin 13.0 - 17

## 2021-10-02 ENCOUNTER — Inpatient Hospital Stay: Payer: PPO | Attending: Oncology

## 2021-10-02 ENCOUNTER — Encounter: Payer: Self-pay | Admitting: Oncology

## 2021-10-02 ENCOUNTER — Inpatient Hospital Stay: Payer: PPO | Admitting: Oncology

## 2021-10-02 VITALS — BP 126/82 | HR 66 | Temp 98.5°F | Resp 18 | Wt 232.2 lb

## 2021-10-02 DIAGNOSIS — D696 Thrombocytopenia, unspecified: Secondary | ICD-10-CM | POA: Insufficient documentation

## 2021-10-02 DIAGNOSIS — Z7901 Long term (current) use of anticoagulants: Secondary | ICD-10-CM | POA: Insufficient documentation

## 2021-10-02 DIAGNOSIS — D693 Immune thrombocytopenic purpura: Secondary | ICD-10-CM | POA: Diagnosis not present

## 2021-10-02 DIAGNOSIS — R161 Splenomegaly, not elsewhere classified: Secondary | ICD-10-CM

## 2021-10-02 DIAGNOSIS — D72820 Lymphocytosis (symptomatic): Secondary | ICD-10-CM | POA: Insufficient documentation

## 2021-10-02 DIAGNOSIS — I10 Essential (primary) hypertension: Secondary | ICD-10-CM | POA: Diagnosis not present

## 2021-10-02 DIAGNOSIS — Z86711 Personal history of pulmonary embolism: Secondary | ICD-10-CM | POA: Insufficient documentation

## 2021-10-02 DIAGNOSIS — Z87891 Personal history of nicotine dependence: Secondary | ICD-10-CM | POA: Insufficient documentation

## 2021-10-02 DIAGNOSIS — E119 Type 2 diabetes mellitus without complications: Secondary | ICD-10-CM | POA: Insufficient documentation

## 2021-10-02 DIAGNOSIS — Z801 Family history of malignant neoplasm of trachea, bronchus and lung: Secondary | ICD-10-CM | POA: Diagnosis not present

## 2021-10-02 LAB — COMPREHENSIVE METABOLIC PANEL
ALT: 28 U/L (ref 0–44)
AST: 32 U/L (ref 15–41)
Albumin: 4.7 g/dL (ref 3.5–5.0)
Alkaline Phosphatase: 85 U/L (ref 38–126)
Anion gap: 9 (ref 5–15)
BUN: 16 mg/dL (ref 8–23)
CO2: 26 mmol/L (ref 22–32)
Calcium: 9.8 mg/dL (ref 8.9–10.3)
Chloride: 104 mmol/L (ref 98–111)
Creatinine, Ser: 0.81 mg/dL (ref 0.61–1.24)
GFR, Estimated: >60 mL/min (ref >60)
Glucose, Bld: 130 mg/dL — ABNORMAL HIGH (ref 70–99)
Potassium: 3.9 mmol/L (ref 3.5–5.1)
Sodium: 139 mmol/L (ref 135–145)
Total Bilirubin: 0.8 mg/dL (ref 0.3–1.2)
Total Protein: 7.5 g/dL (ref 6.5–8.1)

## 2021-10-02 LAB — CBC WITH DIFFERENTIAL/PLATELET
Abs Immature Granulocytes: 0.01 10*3/uL (ref 0.00–0.07)
Basophils Absolute: 0 10*3/uL (ref 0.0–0.1)
Basophils Relative: 0 %
Eosinophils Absolute: 0.2 10*3/uL (ref 0.0–0.5)
Eosinophils Relative: 2 %
HCT: 47.5 % (ref 39.0–52.0)
Hemoglobin: 16.2 g/dL (ref 13.0–17.0)
Immature Granulocytes: 0 %
Lymphocytes Relative: 29 %
Lymphs Abs: 2.1 10*3/uL (ref 0.7–4.0)
MCH: 32.1 pg (ref 26.0–34.0)
MCHC: 34.1 g/dL (ref 30.0–36.0)
MCV: 94.2 fL (ref 80.0–100.0)
Monocytes Absolute: 0.6 10*3/uL (ref 0.1–1.0)
Monocytes Relative: 8 %
Neutro Abs: 4.3 10*3/uL (ref 1.7–7.7)
Neutrophils Relative %: 61 %
Platelets: 168 10*3/uL (ref 150–400)
RBC: 5.04 MIL/uL (ref 4.22–5.81)
RDW: 13.9 % (ref 11.5–15.5)
WBC: 7.1 10*3/uL (ref 4.0–10.5)
nRBC: 0 % (ref 0.0–0.2)

## 2021-10-02 MED ORDER — ASPIRIN 81 MG PO TBEC: 81.0000 mg | DELAYED_RELEASE_TABLET | Freq: Every day | ORAL | 3 refills | Status: DC

## 2021-10-02 NOTE — Progress Notes (Signed)
Hematology/Oncology Progress note [phone] [phone]     Patient Care Team: Earlie Server, MD as PCP - General (Oncology)   Name of the patient: Eric Joyce  [id number]  Feb 17, 1946   REASON FOR VISIT Follow-up for ITP, splenomegaly, monoclonal lymphocytosis, pulmonary embolism.  INTERVAL HISTORY 76 y.o. male presents for follow-up of ITP, splenomegaly, monoclonal lymphocytosis, pulmonary embolism. #04/04/2021 - 04/12/2021 patient was hospitalized for generalized weakness, fever.  Developed melena and diarrhea after treatment of amoxicillin.  He has acute neutropenia, thrombocytopenia.  Patient had extensive work-up during the admission.  Respiratory viral panel negative, GI pathogen panel negative, ANA negative, Lyme disease panel negative, varicella negative, Bartonella negative.  Erlichia negative. CRP is elevated.  ESR is elevated, RPR negative, adequate vitamin B12 and folate.ANA is negative.   Normal fibrinogen.  Normal haptoglobin, Negative hepatitis and HIV.    Flow low cytometry with monoclonal B-cell lymphocytosis of undetermined significance Mildly elevated EBV PCR copies which were not clinically significant per infectious disease specialist.  CMV PCR is negative. PE/superficial venous thrombosis of the right great saphenous vein throughout the calf and thigh.  Anticoagulation was initially held due to severe thrombocytopenia. thrombocytopenia, finished 5 days of dexamethasone 40 mg daily and IVIG x 2 days.  Platelet improved to 49,000 and the patient was discharged on Eliquis 5 mg twice daily with no loading dose.  04/10/2021, bone marrow biopsy showed minor abnormal B-cell population identified, 2% of all cells. CD20, CD5 and CD200 positive.  Extremely dim/negative staining for surface immunoglobulin light chain.  Abundant T cells display nonspecific changes.  04/30/2021, PET scan showed similar moderate splenomegaly with background max SUV between  mediastinal blood pool and liver.  No suspicious hypermetabolic splenic foci.  Diffuse homogeneous might hypermetabolic bone marrow activity.  Nonspecific.  No hypermetabolic cervical/thoracic/abdominal pelvic adenopathy.  Aortic atherosclerosis.  Patient reports feeling well.  He takes Eliquis 5 mg twice daily and tolerates well.  No bleeding events.  Denies any shortness of breath, chest pain, leg swelling. He has not sleep well recently.  INTERVAL HISTORY Eric Joyce is a 76 y.o. male who has above history reviewed by me today presents for follow up visit for management of history of ITP, monoclonal B-cell lymphocytosis. Patient reports feeling well today.  No bleeding, easy bruising.  Patient takes Eliquis 5 mg twice daily and has completed 6 months of anticoagulation.  He  has participated in exercise program and his fatigue level has significantly improved. No unintentional weight loss, night sweats, fever.  Review of Systems  Constitutional:  Negative for appetite change, chills, fatigue, fever and unexpected weight change.  HENT:   Negative for hearing loss and voice change.   Eyes:  Negative for eye problems and icterus.  Respiratory:  Negative for chest tightness, cough and shortness of breath.   Cardiovascular:  Negative for chest pain and leg swelling.  Gastrointestinal:  Negative for abdominal distention and abdominal pain.  Endocrine: Negative for hot flashes.  Genitourinary:  Negative for difficulty urinating, dysuria and frequency.   Musculoskeletal:  Negative for arthralgias.  Skin:  Negative for itching and rash.  Neurological:  Negative for light-headedness and numbness.  Hematological:  Negative for adenopathy. Does not bruise/bleed easily.  Psychiatric/Behavioral:  Negative for confusion.       Allergies  Allergen Reactions   Sulfamethoxazole-Trimethoprim      Past Medical History:  Diagnosis Date   Diabetes mellitus without complication ([facility])     Hyperlipidemia    Hypertension  Myocardial infarction [facility])      Past Surgical History:  Procedure Laterality Date   APPENDECTOMY     CARDIAC SURGERY     2 stents   EYE SURGERY     REPLACEMENT TOTAL KNEE Right    TONSILLECTOMY AND ADENOIDECTOMY      Social History   Socioeconomic History   Marital status: Single    Spouse name: Not on file   Number of children: Not on file   Years of education: Not on file   Highest education level: Not on file  Occupational History   Not on file  Tobacco Use   Smoking status: Former    Types: Cigarettes   Smokeless tobacco: Never  Substance and Sexual Activity   Alcohol use: Not Currently   Drug use: Never   Sexual activity: Not on file  Other Topics Concern   Not on file  Social History Narrative   Not on file   Social Determinants of Health   Financial Resource Strain: Not on file  Food Insecurity: Not on file  Transportation Needs: Not on file  Physical Activity: Not on file  Stress: Not on file  Social Connections: Not on file  Intimate Partner Violence: Not on file    Family History  Problem Relation Age of Onset   Lung cancer Mother    Heart disease Father    Diabetes Paternal Uncle    Diabetes Paternal Grandmother      Current Outpatient Medications:    aspirin EC 81 MG tablet, Take 1 tablet (81 mg total) by mouth daily. Swallow whole., Disp: 90 tablet, Rfl: 3   atorvastatin (LIPITOR) 40 MG tablet, Take 40 mg by mouth daily., Disp: , Rfl:    Coenzyme Q10 100 MG capsule, Take 100 mg by mouth daily., Disp: , Rfl:    empagliflozin (JARDIANCE) 10 MG TABS tablet, Take by mouth daily., Disp: , Rfl:    gabapentin (NEURONTIN) 400 MG capsule, Take 400 mg by mouth 3 (three) times daily., Disp: , Rfl:    melatonin 3 MG TABS tablet, Take 6 mg by mouth at bedtime., Disp: , Rfl:    metFORMIN (GLUCOPHAGE) 500 MG tablet, Take 500 mg by mouth 2 (two) times daily with a meal., Disp: , Rfl:    metoprolol succinate (TOPROL-XL)  25 MG 24 hr tablet, Take 25 mg by mouth daily., Disp: , Rfl:    Multiple Vitamins-Minerals (MULTIVITAMIN WITH MINERALS) tablet, Take 1 tablet by mouth daily., Disp: , Rfl:    oxybutynin (DITROPAN-XL) 10 MG 24 hr tablet, Take 1 tablet (10 mg total) by mouth daily., Disp: 30 tablet, Rfl: 11   ALPRAZolam (XANAX) 0.5 MG tablet, Take 1 tablet (0.5 mg total) by mouth 2 (two) times daily as needed for anxiety or sleep. (Patient not taking: Reported on 10/02/2021), Disp: 60 tablet, Rfl: 0  Physical exam:  Vitals:   10/02/21 1003  BP: 126/82  Pulse: 66  Resp: 18  Temp: 98.5 F (36.9 C)  Weight: 232 lb 3.2 oz (105.3 kg)   Physical Exam Constitutional:      General: He is not in acute distress. HENT:     Head: Normocephalic and atraumatic.  Eyes:     General: No scleral icterus. Cardiovascular:     Rate and Rhythm: Normal rate and regular rhythm.     Heart sounds: Normal heart sounds.  Pulmonary:     Effort: Pulmonary effort is normal. No respiratory distress.     Breath sounds: No wheezing.  Abdominal:     General: Bowel sounds are normal. There is no distension.     Palpations: Abdomen is soft.  Musculoskeletal:        General: No deformity. Normal range of motion.     Cervical back: Normal range of motion and neck supple.  Skin:    General: Skin is warm and dry.     Findings: No erythema or rash.  Neurological:     Mental Status: He is alert and oriented to person, place, and time. Mental status is at baseline.     Cranial Nerves: No cranial nerve deficit.     Coordination: Coordination normal.  Psychiatric:        Mood and Affect: Mood normal.        Latest Ref Rng & Units 10/02/2021    9:48 AM  CMP  Glucose 70 - 99 mg/dL 130   BUN 8 - 23 mg/dL 16   Creatinine 0.61 - 1.24 mg/dL 0.81   Sodium 135 - 145 mmol/L 139   Potassium 3.5 - 5.1 mmol/L 3.9   Chloride 98 - 111 mmol/L 104   CO2 22 - 32 mmol/L 26   Calcium 8.9 - 10.3 mg/dL 9.8   Total Protein 6.5 - 8.1 g/dL 7.5    Total Bilirubin 0.3 - 1.2 mg/dL 0.8   Alkaline Phos 38 - 126 U/L 85   AST 15 - 41 U/L 32   ALT 0 - 44 U/L 28       Latest Ref Rng & Units 10/02/2021    9:48 AM  CBC  WBC 4.0 - 10.5 K/uL 7.1   Hemoglobin 13.0 - 17.0 g/dL 16.2   Hematocrit 39.0 - 52.0 % 47.5   Platelets 150 - 400 K/uL 168     RADIOGRAPHIC STUDIES: I have personally reviewed the radiological images as listed and agreed with the findings in the report. No results found.   Assessment and plan  1. Monoclonal B-cell lymphocytosis   2. Splenomegaly   3. Monoclonal B-cell lymphocytosis of undetermined significance   4. History of pulmonary embolism    #History of acute ITP, status post steroid course and IVIG x2 Labs are reviewed and discussed with patient. No thrombocytopenia.  Platelet count is normal.  Observation.  #Splenomegaly, unknown etiology.  04/30/2021, PET scan showed similar mild splenomegaly with low FDG activity.  No suspicious hypermetabolic splenic foci, adenopathy. Likely reactive to infection.  Patient is asymptomatic.  Physical examination did not reveal significant splenomegaly  recommend observation.  #Monoclonal B-cell lymphocytosis.  Bone marrow biopsy reports were reviewed and discussed with patient.  2% clonal B cells.   Patient does not have any constitutional symptoms.  CBC shows no leukocytosis/lymphocytosis.  No constitutional symptoms.   Observation.  #Provoked acute pulmonary embolism due to immobilization secondary to hospitalization. He has finished total of 6 months of anticoagulation.  Check factor V Leiden mutation and prothrombin gene mutation. Discussed with patient recommend patient to stop Eliquis. He may take Eliquis 2.5 mg twice daily prophylactically if he expect any immobilization circumstances long distance car ride, flights, postsurgery, etc.   Orders Placed This Encounter  Procedures   CBC with Differential/Platelet    Standing Status:   Future    Standing  Expiration Date:   10/03/2022   Comprehensive metabolic panel    Standing Status:   Future    Standing Expiration Date:   10/03/2022   Flow cytometry panel-leukemia/lymphoma work-up    Standing Status:   Future  Standing Expiration Date:   10/03/2022      Follow-up 1 year  Earlie Server, MD, PhD [facility] Hematology Oncology 10/02/2021

## 2021-10-02 NOTE — Progress Notes (Signed)
Patient here for follow up. No new concerns voiced.  °

## 2021-10-08 LAB — FACTOR 5 LEIDEN

## 2021-10-10 DIAGNOSIS — M9907 Segmental and somatic dysfunction of upper extremity: Secondary | ICD-10-CM | POA: Diagnosis not present

## 2021-10-10 DIAGNOSIS — M531 Cervicobrachial syndrome: Secondary | ICD-10-CM | POA: Diagnosis not present

## 2021-10-10 DIAGNOSIS — M9902 Segmental and somatic dysfunction of thoracic region: Secondary | ICD-10-CM | POA: Diagnosis not present

## 2021-10-10 DIAGNOSIS — M9901 Segmental and somatic dysfunction of cervical region: Secondary | ICD-10-CM | POA: Diagnosis not present

## 2021-10-10 DIAGNOSIS — M9903 Segmental and somatic dysfunction of lumbar region: Secondary | ICD-10-CM | POA: Diagnosis not present

## 2021-10-10 LAB — PROTHROMBIN GENE MUTATION

## 2021-10-11 DIAGNOSIS — M9907 Segmental and somatic dysfunction of upper extremity: Secondary | ICD-10-CM | POA: Diagnosis not present

## 2021-10-11 DIAGNOSIS — M9903 Segmental and somatic dysfunction of lumbar region: Secondary | ICD-10-CM | POA: Diagnosis not present

## 2021-10-11 DIAGNOSIS — M531 Cervicobrachial syndrome: Secondary | ICD-10-CM | POA: Diagnosis not present

## 2021-10-11 DIAGNOSIS — M9902 Segmental and somatic dysfunction of thoracic region: Secondary | ICD-10-CM | POA: Diagnosis not present

## 2021-10-11 DIAGNOSIS — M9901 Segmental and somatic dysfunction of cervical region: Secondary | ICD-10-CM | POA: Diagnosis not present

## 2021-10-16 DIAGNOSIS — M9907 Segmental and somatic dysfunction of upper extremity: Secondary | ICD-10-CM | POA: Diagnosis not present

## 2021-10-16 DIAGNOSIS — M531 Cervicobrachial syndrome: Secondary | ICD-10-CM | POA: Diagnosis not present

## 2021-10-16 DIAGNOSIS — M9901 Segmental and somatic dysfunction of cervical region: Secondary | ICD-10-CM | POA: Diagnosis not present

## 2021-10-16 DIAGNOSIS — M9903 Segmental and somatic dysfunction of lumbar region: Secondary | ICD-10-CM | POA: Diagnosis not present

## 2021-10-16 DIAGNOSIS — M9902 Segmental and somatic dysfunction of thoracic region: Secondary | ICD-10-CM | POA: Diagnosis not present

## 2021-10-18 DIAGNOSIS — M9907 Segmental and somatic dysfunction of upper extremity: Secondary | ICD-10-CM | POA: Diagnosis not present

## 2021-10-18 DIAGNOSIS — M531 Cervicobrachial syndrome: Secondary | ICD-10-CM | POA: Diagnosis not present

## 2021-10-18 DIAGNOSIS — M9903 Segmental and somatic dysfunction of lumbar region: Secondary | ICD-10-CM | POA: Diagnosis not present

## 2021-10-18 DIAGNOSIS — M9902 Segmental and somatic dysfunction of thoracic region: Secondary | ICD-10-CM | POA: Diagnosis not present

## 2021-10-18 DIAGNOSIS — M9901 Segmental and somatic dysfunction of cervical region: Secondary | ICD-10-CM | POA: Diagnosis not present

## 2021-10-22 DIAGNOSIS — M9901 Segmental and somatic dysfunction of cervical region: Secondary | ICD-10-CM | POA: Diagnosis not present

## 2021-10-22 DIAGNOSIS — M531 Cervicobrachial syndrome: Secondary | ICD-10-CM | POA: Diagnosis not present

## 2021-10-22 DIAGNOSIS — M9903 Segmental and somatic dysfunction of lumbar region: Secondary | ICD-10-CM | POA: Diagnosis not present

## 2021-10-22 DIAGNOSIS — M9907 Segmental and somatic dysfunction of upper extremity: Secondary | ICD-10-CM | POA: Diagnosis not present

## 2021-10-22 DIAGNOSIS — M9902 Segmental and somatic dysfunction of thoracic region: Secondary | ICD-10-CM | POA: Diagnosis not present

## 2021-10-23 ENCOUNTER — Ambulatory Visit (INDEPENDENT_AMBULATORY_CARE_PROVIDER_SITE_OTHER): Payer: No Typology Code available for payment source | Admitting: Licensed Clinical Social Worker

## 2021-10-23 ENCOUNTER — Encounter (HOSPITAL_COMMUNITY): Payer: Self-pay | Admitting: Licensed Clinical Social Worker

## 2021-10-23 DIAGNOSIS — F4310 Post-traumatic stress disorder, unspecified: Secondary | ICD-10-CM

## 2021-10-23 DIAGNOSIS — F1091 Alcohol use, unspecified, in remission: Secondary | ICD-10-CM

## 2021-10-23 NOTE — Progress Notes (Signed)
Eric Joyce is a 76 y.o. male patient who presents for his initial CCA as a referral from [place]. Cln completed part of the CCA and made a new appointment next week to complete the CCA.     Collaboration of Care: Other: None needed at this at this session.  Patient/Guardian was advised Release of Information must be obtained prior to any record release in order to collaborate their care with an outside provider. Patient/Guardian was advised if they have not already done so to contact the registration department to sign all necessary forms in order for [place] to release information regarding their care.   Consent: Patient/Guardian gives verbal consent for treatment and assignment of benefits for services provided during this visit. Patient/Guardian expressed understanding and agreed to proceed.    Tila Millirons S, LCAS 60 minutes of non-face-to face meeting time

## 2021-10-25 DIAGNOSIS — M9907 Segmental and somatic dysfunction of upper extremity: Secondary | ICD-10-CM | POA: Diagnosis not present

## 2021-10-25 DIAGNOSIS — M531 Cervicobrachial syndrome: Secondary | ICD-10-CM | POA: Diagnosis not present

## 2021-10-25 DIAGNOSIS — M9902 Segmental and somatic dysfunction of thoracic region: Secondary | ICD-10-CM | POA: Diagnosis not present

## 2021-10-25 DIAGNOSIS — M9901 Segmental and somatic dysfunction of cervical region: Secondary | ICD-10-CM | POA: Diagnosis not present

## 2021-10-25 DIAGNOSIS — M9903 Segmental and somatic dysfunction of lumbar region: Secondary | ICD-10-CM | POA: Diagnosis not present

## 2021-10-30 ENCOUNTER — Ambulatory Visit (INDEPENDENT_AMBULATORY_CARE_PROVIDER_SITE_OTHER): Payer: No Typology Code available for payment source | Admitting: Licensed Clinical Social Worker

## 2021-10-30 ENCOUNTER — Encounter (HOSPITAL_COMMUNITY): Payer: Self-pay | Admitting: Licensed Clinical Social Worker

## 2021-10-30 DIAGNOSIS — F1091 Alcohol use, unspecified, in remission: Secondary | ICD-10-CM | POA: Diagnosis not present

## 2021-10-30 DIAGNOSIS — F4310 Post-traumatic stress disorder, unspecified: Secondary | ICD-10-CM

## 2021-11-01 DIAGNOSIS — M9907 Segmental and somatic dysfunction of upper extremity: Secondary | ICD-10-CM | POA: Diagnosis not present

## 2021-11-01 DIAGNOSIS — M531 Cervicobrachial syndrome: Secondary | ICD-10-CM | POA: Diagnosis not present

## 2021-11-01 DIAGNOSIS — M9902 Segmental and somatic dysfunction of thoracic region: Secondary | ICD-10-CM | POA: Diagnosis not present

## 2021-11-01 DIAGNOSIS — M9901 Segmental and somatic dysfunction of cervical region: Secondary | ICD-10-CM | POA: Diagnosis not present

## 2021-11-01 DIAGNOSIS — M9903 Segmental and somatic dysfunction of lumbar region: Secondary | ICD-10-CM | POA: Diagnosis not present

## 2021-11-08 ENCOUNTER — Ambulatory Visit (INDEPENDENT_AMBULATORY_CARE_PROVIDER_SITE_OTHER): Payer: No Typology Code available for payment source | Admitting: Licensed Clinical Social Worker

## 2021-11-08 DIAGNOSIS — F4310 Post-traumatic stress disorder, unspecified: Secondary | ICD-10-CM

## 2021-11-08 DIAGNOSIS — F1091 Alcohol use, unspecified, in remission: Secondary | ICD-10-CM | POA: Diagnosis not present

## 2021-11-08 NOTE — Progress Notes (Signed)
Comprehensive Clinical Assessment (CCA) Note     Virtual Visit via Video Note   I connected with [place] on 10/30/21 at 11:00 AM EDT by a video enabled telemedicine application and verified that I am speaking with the correct person using two identifiers.   Location: Patient: Home Provider: Home Office   I discussed the limitations of evaluation and management by telemedicine and the availability of in person appointments. The patient expressed understanding and agreed to proceed.     10/30/21 [place] [id number]  Chief Complaint: PTSD   Visit Diagnosis: PTSD    CCA Screening, Triage and Referral (STR)  Patient Reported Information How did you hear about [place]? No data recorded Referral name: No data recorded Referral phone number: No data recorded  Whom do you see for routine medical problems? No data recorded Practice/Facility Name: No data recorded Practice/Facility Phone Number: No data recorded Name of Contact: No data recorded Contact Number: No data recorded Contact Fax Number: No data recorded Prescriber Name: No data recorded Prescriber Address (if known): No data recorded  What Is the Reason for Your Visit/Call Today? No data recorded How Long Has This Been Causing You Problems? No data recorded What Do You Feel Would Help You the Most Today? No data recorded  Have You Recently Been in Any Inpatient Treatment (Hospital/Detox/Crisis Center/28-Day Program)? No data recorded Name/Location of Program/Hospital:No data recorded How Long Were You There? No data recorded When Were You Discharged? No data recorded  Have You Ever Received Services From [facility] Before? No data recorded Who Do You See at [facility]? No data recorded  Have You Recently Had Any Thoughts About Hurting Yourself? No data recorded Are You Planning to Commit Suicide/Harm Yourself At This time? No data recorded  Have you Recently Had Thoughts About [place]? No data  recorded Explanation: No data recorded  Have You Used Any Alcohol or Drugs in the Past 24 Hours? No data recorded How Long Ago Did You Use Drugs or Alcohol? No data recorded What Did You Use and How Much? No data recorded  Do You Currently Have a Therapist/Psychiatrist? No data recorded Name of Therapist/Psychiatrist: No data recorded  Have You Been Recently Discharged From Any Office Practice or Programs? No data recorded Explanation of Discharge From Practice/Program: No data recorded    CCA Screening Triage Referral Assessment Type of Contact: No data recorded Is this Initial or Reassessment? No data recorded Date Telepsych consult ordered in [facility]:  No data recorded Time Telepsych consult ordered in [facility]:  No data recorded  Patient Reported Information Reviewed? No data recorded Patient Left Without Being Seen? No data recorded Reason for Not Completing Assessment: No data recorded  Collateral Involvement: No data recorded  Does Patient Have a [place]? No data recorded Name and Contact of Legal Guardian: No data recorded If Minor and Not Living with Parent(s), Who has Custody? No data recorded Is CPS involved or ever been involved? No data recorded Is APS involved or ever been involved? No data recorded  Patient Determined To Be At Risk for Harm To Self or Others Based on Review of Patient Reported Information or Presenting Complaint? No data recorded Method: No data recorded Availability of Means: No data recorded Intent: No data recorded Notification Required: No data recorded Additional Information for Danger to Others Potential: No data recorded Additional Comments for Danger to Others Potential: No data recorded Are There Guns or Other Weapons in Your Home? No  data recorded Types of Guns/Weapons: No data recorded Are These Weapons Safely Secured?                            No data recorded Who Could Verify You Are Able To Have These Secured: No  data recorded Do You Have any Outstanding Charges, Pending Court Dates, Parole/Probation? No data recorded Contacted To Inform of Risk of Harm To Self or Others: No data recorded  Location of Assessment: No data recorded  Does Patient Present under Involuntary Commitment? No data recorded IVC Papers Initial File Date: No data recorded  [place] of Residence: No data recorded  Patient Currently Receiving the Following Services: No data recorded  Determination of Need: No data recorded  Options For Referral: No data recorded    CCA Biopsychosocial Intake/Chief Complaint:  [facility] and anxiety as part of his PTSD. He has a previous alcohol addiction,but in remission for a year. I live alone, with a daughter in [place] and sister in [place]. never been married. Symptoms: no motivation, doesn't want to leave the house, isolated, raised by a minister, not involved in church  Current Symptoms/Problems: little sleep,  social anxiety, isolation, periods of depression. lack of motivation, sadness, PTSD symptoms.   Patient Reported Schizophrenia/Schizoaffective Diagnosis in Past: No   Strengths: desire to feel better  Preferences: OP therapy  Abilities: good at being a friend with people   Type of Services Patient Feels are Needed: OP therapy   Initial Clinical Notes/Concerns: PCP at [facility] prescribes wellbutrin 150 mg   Mental Health Symptoms Depression:   Change in energy/activity; Difficulty Concentrating; Irritability; Sleep (too much or little)   Duration of Depressive symptoms:  Greater than two weeks   Mania:   None   Anxiety:    Irritability; Restlessness; Tension   Psychosis:   None   Duration of Psychotic symptoms: No data recorded  Trauma:   Avoids reminders of event; Detachment from others; Hypervigilance; Emotional numbing; Guilt/shame; Irritability/anger   Obsessions:   Intrusive/time consuming; Recurrent & persistent  thoughts/impulses/images   Compulsions:   None   Inattention:  No data recorded  Hyperactivity/Impulsivity:  No data recorded  Oppositional/Defiant Behaviors:   None   Emotional Irregularity:   None   Other Mood/Personality Symptoms:  No data recorded   Mental Status Exam Appearance and self-care  Stature:   Average   Weight:   Average weight   Clothing:   Casual   Grooming:   Normal   Cosmetic use:   None   Posture/gait:   Normal   Motor activity:   Not Remarkable   Sensorium  Attention:   Normal   Concentration:   Preoccupied   Orientation:   X5   Recall/memory:  No data recorded  Affect and Mood  Affect:   Appropriate   Mood:   Euthymic   Relating  Eye contact:   Normal   Facial expression:   Responsive   Attitude toward examiner:   Cooperative   Thought and Language  Speech flow:  Normal   Thought content:   Appropriate to Mood and Circumstances   Preoccupation:   None   Hallucinations:   None   Organization:  No data recorded  [employer] of Knowledge:   Average   Intelligence:   Average   Abstraction:   Normal   Judgement:   Fair   Building surveyor  Insight:   Fair   Decision Making:   Normal   Social Functioning  Social Maturity:   Impulsive   Social Judgement:   Normal   Stress  Stressors:   Transitions   Coping Ability:   Deficient supports   Skill Deficits:  No data recorded  Supports:   Friends/Service system; Family     Religion:    Leisure/Recreation:    Exercise/Diet:     CCA Employment/Education Employment/Work Situation: Employment / Work Technical sales engineer: On disability Why is Patient on Disability: 90% [facility] (health wise 70%, PTSD 20%) How Long has Patient Been on Disability: 4years Patient's Job has Been Impacted by Current Illness: No What is the Longest Time Patient has Held a Job?: 10 years Where was the Patient Employed  at that Time?: [employer] Has Patient ever Been in the [employer]?: Yes (Describe in comment) Did You Receive Any Psychiatric Treatment/Services While in the [place]?: No  Education: Education Is Patient Currently Attending School?: No Last Grade Completed: 12 Did Teacher, 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 Did You Attend College?: No Did [place]?: No Did You Have An Individualized Education Program (IIEP): No Did You Have Any Difficulty At School?: No Patient's Education Has Been Impacted by Current Illness: No   CCA Family/Childhood History Family and Relationship History: Family history Marital status: Single Does patient have children?: Yes How many children?: 1 How is patient's relationship with their children?: 1daughterwho lives in [place] 38, getting married 9/16  Childhood History:  Childhood History By whom was/is the patient raised?: Both parents Additional childhood history information: I had scarlet fever, severe strep throats, poisened by accident as a child. stay at home mother, father was a Theme park manager. I got in trouble a lot doing stupid stuff Description of patient's relationship with caregiver when they were a child: good relationship with both parents, father was gone a lot being a Theme park manager Patient's description of current relationship with people who raised him/her: both deceased How were you disciplined when you got in trouble as a child/adolescent?: spanking, yelled out Does patient have siblings?: Yes Number of Siblings: 1 Description of patient's current relationship with siblings: 1 sister in [place], great relationship currently, 14 years older. as a child, I didn't have friends because no kids lived around me Did patient suffer any verbal/emotional/physical/sexual abuse as a child?: No Did patient suffer from severe childhood neglect?: No Has patient ever been sexually abused/assaulted/raped as an adolescent or adult?: No Was the patient ever a  victim of a crime or a disaster?: No Witnessed domestic violence?: No Has patient been affected by domestic violence as an adult?: No  Child/Adolescent Assessment:     CCA Substance Use Alcohol/Drug Use: Alcohol / Drug Use History of alcohol / drug use?: Yes Longest period of sobriety (when/how long): currently 6 years sober, used to attend AA and had a sponsor                         ASAM's:  Six Dimensions of Multidimensional Assessment  Dimension 1:  Acute Intoxication and/or Withdrawal Potential:      Dimension 2:  Biomedical Conditions and Complications:      Dimension 3:  Emotional, Behavioral, or Cognitive Conditions and Complications:     Dimension 4:  Readiness to Change:     Dimension 5:  Relapse, Continued use, or Continued Problem Potential:     Dimension 6:  Recovery/Living Environment:  ASAM Severity Score:    ASAM Recommended Level of Treatment:     Substance use Disorder (SUD)    Recommendations for Services/Supports/Treatments: Recommendations for Services/Supports/Treatments Recommendations For Services/Supports/Treatments: Individual Therapy  DSM5 Diagnoses: Patient Active Problem List   Diagnosis Date Noted   Monoclonal B-cell lymphocytosis of undetermined significance 10/02/2021   History of pulmonary embolism 10/02/2021   Rectal abscess    Generalized weakness 04/04/2021   Hypophosphatemia 04/04/2021   Acute pulmonary embolism ([place]) 04/04/2021   Lactic acidosis 04/04/2021   SOB (shortness of breath) 04/04/2021   Melena 04/04/2021   Other neutropenia (HCC)    Thrombocytopenia (HCC)    Abscess of anal and rectal regions    Splenomegaly    Fever and chills    Diarrhea    Abdominal distension (gaseous) 01/18/2021   Actinic keratosis 01/18/2021   Alcoholism ([place]) 01/18/2021   Basal cell carcinoma 01/18/2021   Chronic post-traumatic stress disorder 01/18/2021   COVID-19 01/18/2021   Depression 01/18/2021   Diabetes mellitus  with neuropathy ([place]) 01/18/2021   Encounter for immunization 01/18/2021   History of other malignant neoplasm of skin 00/34/9611   Metabolic syndrome 64/35/3912   Nephrolithiasis 01/18/2021   Nocturia 01/18/2021   Osteoarthrosis 01/18/2021   Other urticaria 01/18/2021   Presence of intraocular lens 01/18/2021   Rosacea 01/18/2021   Seborrheic dermatitis 01/18/2021   Viral warts 01/18/2021   Asymptomatic varicose veins 10/19/2020   CAD (coronary artery disease) 10/19/2020   Diabetes mellitus type 2, uncomplicated ([place]) 25/83/4621   Hypertensive disorder 10/19/2020   Class 1 obesity due to excess calories without serious comorbidity with body mass index (BMI) of 30.0 to 30.9 in adult 10/04/2020   Diabetic neuropathy ([place]) 04/13/2020   Hav (hallux abducto valgus), unspecified laterality 04/13/2020   Other specified postprocedural states 01/18/2014   Myocardial infarction ([place]) 06/29/2002   Hyperlipidemia 03/04/2002   Insomnia 03/04/1968    Patient Centered Plan: Patient is on the following Treatment Plan(s):  Post Traumatic Stress Disorder   Referrals to Alternative Service(s): Referred to Alternative Service(s):   Place:   Date:   Time:    Referred to Alternative Service(s):   Place:   Date:   Time:    Referred to Alternative Service(s):   Place:   Date:   Time:    Referred to Alternative Service(s):   Place:   Date:   Time:      Collaboration of Care: Other: Continue to see [facility] [place] PCP for medication management  Patient/Guardian was advised Release of Information must be obtained prior to any record release in order to collaborate their care with an outside provider. Patient/Guardian was advised if they have not already done so to contact the registration department to sign all necessary forms in order for [place] to release information regarding their care.   Consent: Patient/Guardian gives verbal consent for treatment and assignment of benefits for services provided during this visit.  Patient/Guardian expressed understanding and agreed to proceed.   Aleesia Henney S, LCAS  I provided 45 minutes of non-face-to-face time during this encounter.

## 2021-11-12 DIAGNOSIS — M9903 Segmental and somatic dysfunction of lumbar region: Secondary | ICD-10-CM | POA: Diagnosis not present

## 2021-11-12 DIAGNOSIS — M9901 Segmental and somatic dysfunction of cervical region: Secondary | ICD-10-CM | POA: Diagnosis not present

## 2021-11-12 DIAGNOSIS — M9907 Segmental and somatic dysfunction of upper extremity: Secondary | ICD-10-CM | POA: Diagnosis not present

## 2021-11-12 DIAGNOSIS — M531 Cervicobrachial syndrome: Secondary | ICD-10-CM | POA: Diagnosis not present

## 2021-11-12 DIAGNOSIS — M9902 Segmental and somatic dysfunction of thoracic region: Secondary | ICD-10-CM | POA: Diagnosis not present

## 2021-11-13 ENCOUNTER — Ambulatory Visit (INDEPENDENT_AMBULATORY_CARE_PROVIDER_SITE_OTHER): Payer: No Typology Code available for payment source | Admitting: Licensed Clinical Social Worker

## 2021-11-13 DIAGNOSIS — F4310 Post-traumatic stress disorder, unspecified: Secondary | ICD-10-CM

## 2021-11-13 DIAGNOSIS — F1091 Alcohol use, unspecified, in remission: Secondary | ICD-10-CM | POA: Diagnosis not present

## 2021-11-22 ENCOUNTER — Ambulatory Visit (INDEPENDENT_AMBULATORY_CARE_PROVIDER_SITE_OTHER): Payer: No Typology Code available for payment source | Admitting: Licensed Clinical Social Worker

## 2021-11-22 ENCOUNTER — Encounter (HOSPITAL_COMMUNITY): Payer: Self-pay | Admitting: Licensed Clinical Social Worker

## 2021-11-22 DIAGNOSIS — F4310 Post-traumatic stress disorder, unspecified: Secondary | ICD-10-CM

## 2021-11-22 DIAGNOSIS — F1091 Alcohol use, unspecified, in remission: Secondary | ICD-10-CM

## 2021-11-22 NOTE — Progress Notes (Signed)
   THERAPIST PROGRESS NOTE  Virtual Visit via Video Note   I connected with [facility] on 11/22/20 at 10:00 AM EDT by a video enabled telemedicine application and verified that I am speaking with the correct person using two identifiers.   Location: Patient: Home Provider: Home Office   I discussed the limitations of evaluation and management by telemedicine and the availability of in person appointments. The patient expressed understanding and agreed to proceed.      Session Time: 10:00-11:00 am  Participation Level: Active  Behavioral Response: CasualAlertDepressed  Type of Therapy: Individual Therapy  Treatment Goals addressed: Meet with clinician in person x1 every 2 weeks for therapy to monitor for progress towards goals and address any barriers to success; Reduce depression from average severity level of 6/10 down to a 4/10 in next 90 days by engaging in 3-5 positive coping skills for 1-2 hours per day as part of developing self-care routine; Reduce average anxiety level from 7/10 down to 5/10 in next 90 days by utilizing 3-5 relaxation skills/grounding skills per day, such as mindful breathing, progressive muscle relaxation, positive visualizations  ProgressTowards Goals: Initial  Interventions: Exposure Therapy  Summary: Eric Joyce is a 76 y.o. male who presents for today's session on time and was alert, oriented x5, with no evidence or self-report of SI/HI or A/V H.  Patient reported ongoing compliance with medication and denied any use of alcohol or illicit substances.  Clinician inquired about patient's current emotional ratings, as well as any significant changes in thoughts, feelings or behaviors since previous session.  Patient reported scores of 3/10 for depression, 3/10 for anxiety, 0/10 for anger/irritability. This is the first counseling session with patient. Cln and pt spent a considerable amount of time building a trusting, therapeutic relationship. Pt has  just returned from [place] for his daughter's wedding. Pt spoke of his feelings surrounding the wedding and seeing family. Pt reports, "I was the first to leave the reception due to uneasy feelings of being in a crowded room. I get that way a lot if I'm around a lot of people." Cln discussed Exposure Therapy with pt, challenging him to go  in crowded places prepared: breathing exercises, meditation, relaxation techniques, positive visualizations. Afterwards, the cln and pt will explore how the coping skills worked. Cln encouraged pt to go out more, finding people with same interests. Cln inquired of patient's interests, encouraged him to improve his quality of life.   Suicidal/Homicidal: Nowithout intent/plan  Plan: Return again in 1 week.  Diagnosis: PTSD (post-traumatic stress disorder)  Alcohol use disorder in remission  Collaboration of Care: Other: Continue seeing providers at [facility] was advised Release of Information must be obtained prior to any record release in order to collaborate their care with an outside provider. Patient/Guardian was advised if they have not already done so to contact the registration department to sign all necessary forms in order for [place] to release information regarding their care.   Consent: Patient/Guardian gives verbal consent for treatment and assignment of benefits for services provided during this visit. Patient/Guardian expressed understanding and agreed to proceed.   [place], LCAS 11/22/2021

## 2021-11-24 ENCOUNTER — Encounter (HOSPITAL_COMMUNITY): Payer: Self-pay | Admitting: Licensed Clinical Social Worker

## 2021-11-24 NOTE — Progress Notes (Signed)
Virtual Visit via Video Note   I connected with [place] on 11/13/20 at 11:00 AM EDT by a video enabled telemedicine application and verified that I am speaking with the correct person using two identifiers.   Location: Patient: Home Provider: Home Office   I discussed the limitations of evaluation and management by telemedicine and the availability of in person appointments. The patient expressed understanding and agreed to proceed.         Session Time: 11:00-11:45 am   Participation Level: Active   Behavioral Response: CasualAlert   Type of Therapy: Individual Therapy   Treatment Goals addressed: Meet with clinician in person x1 every 2 weeks for therapy to monitor for progress towards goals and address any barriers to success; Reduce depression from average severity level of 6/10 down to a 4/10 in next 90 days by engaging in 3-5 positive coping skills for 1-2 hours per day as part of developing self-care routine; Reduce average anxiety level from 7/10 down to 5/10 in next 90 days by utilizing 3-5 relaxation skills/grounding skills per day, such as mindful breathing, progressive muscle relaxation, positive visualizations   ProgressTowards Goals: Progressing   Interventions: Exposure Therapy   Summary: Eric Joyce is a 76 y.o. male who presents for today's session on time and was alert, oriented x5, with no evidence or self-report of SI/HI or A/V H.  Patient reported ongoing compliance with medication and denied any use of alcohol or illicit substances.  Clinician inquired about patient's current emotional ratings, as well as any significant changes in thoughts, feelings or behaviors since previous session.  Patient reported scores of 3/10 for depression, 3/10 for anxiety, 0/10 for anger/irritability. Cln completed assessments: GAD7, PHQ9, Suicide screening, pain assessment, nutrition assessment and reviewed results with patient.     Suicidal/Homicidal: Nowithout  intent/plan   Plan: Return again in 1 week.   Diagnosis: PTSD (post-traumatic stress disorder)   Alcohol use disorder in remission   Collaboration of Care: Other: Continue seeing providers at [facility] was advised Release of Information must be obtained prior to any record release in order to collaborate their care with an outside provider. Patient/Guardian was advised if they have not already done so to contact the registration department to sign all necessary forms in order for [place] to release information regarding their care.    Consent: Patient/Guardian gives verbal consent for treatment and assignment of benefits for services provided during this visit. Patient/Guardian expressed understanding and agreed to proceed.   I provided 45 minutes of non-face-to-face time during this encounter.     [place], LCAS 11/13/2021

## 2021-11-24 NOTE — Progress Notes (Signed)
Virtual Visit via Video Note   I connected with [facility] on 11/08/20 at 11:00 AM EDT by a video enabled telemedicine application and verified that I am speaking with the correct person using two identifiers.   Location: Patient: Home Provider: Home Office   I discussed the limitations of evaluation and management by telemedicine and the availability of in person appointments. The patient expressed understanding and agreed to proceed.         Session Time: 11:00-11:45 am   Participation Level: Active   Behavioral Response: CasualAlert   Type of Therapy: Individual Therapy     Interventions: Completed treatment plan   Summary: Eric Joyce is a 76 y.o. male who presents for today's session on time and was alert, oriented x5, with no evidence or self-report of SI/HI or A/V H.  Patient reported ongoing compliance with medication and denied any use of alcohol or illicit substances.  Clinician inquired about patient's current emotional ratings, as well as any significant changes in thoughts, feelings or behaviors since previous session.  Patient reported scores of 3/10 for depression, 3/10 for anxiety, 0/10 for anger/irritability. Cln completed treatment plan with patient, goals, strategies. Pt has input in completing the treatment plan and verbalized acceptance of the plan.       Suicidal/Homicidal: Nowithout intent/plan   Plan: Return again in 1 week.   Diagnosis: PTSD (post-traumatic stress disorder)   Alcohol use disorder in remission   Collaboration of Care: Other: Continue seeing providers at [facility] was advised Release of Information must be obtained prior to any record release in order to collaborate their care with an outside provider. Patient/Guardian was advised if they have not already done so to contact the registration department to sign all necessary forms in order for [place] to release information regarding their care.    Consent: Patient/Guardian  gives verbal consent for treatment and assignment of benefits for services provided during this visit. Patient/Guardian expressed understanding and agreed to proceed.    I provided 45 minutes of non-face-to-face time during this encounter.       [place], LCAS 11/08/2021

## 2021-11-27 ENCOUNTER — Ambulatory Visit (HOSPITAL_COMMUNITY): Payer: No Typology Code available for payment source | Admitting: Licensed Clinical Social Worker

## 2021-11-28 ENCOUNTER — Encounter (HOSPITAL_COMMUNITY): Payer: Self-pay | Admitting: Licensed Clinical Social Worker

## 2021-11-28 ENCOUNTER — Ambulatory Visit (INDEPENDENT_AMBULATORY_CARE_PROVIDER_SITE_OTHER): Payer: No Typology Code available for payment source | Admitting: Licensed Clinical Social Worker

## 2021-11-28 DIAGNOSIS — F4310 Post-traumatic stress disorder, unspecified: Secondary | ICD-10-CM

## 2021-11-28 NOTE — Progress Notes (Signed)
   THERAPIST PROGRESS NOTE  Virtual Visit via Video Note   I connected with [place] on 11/22/20 at 10:00 AM EDT by a video enabled telemedicine application and verified that I am speaking with the correct person using two identifiers.   Location: Patient: Home Provider: Home Office   I discussed the limitations of evaluation and management by telemedicine and the availability of in person appointments. The patient expressed understanding and agreed to proceed.      Session Time: 10:00-11:00 am  Participation Level: Active  Behavioral Response: CasualAlertDepressed  Type of Therapy: Individual Therapy  Treatment Goals addressed: Meet with clinician in person x1 every 2 weeks for therapy to monitor for progress towards goals and address any barriers to success; Reduce depression from average severity level of 6/10 down to a 4/10 in next 90 days by engaging in 3-5 positive coping skills for 1-2 hours per day as part of developing self-care routine; Reduce average anxiety level from 7/10 down to 5/10 in next 90 days by utilizing 3-5 relaxation skills/grounding skills per day, such as mindful breathing, progressive muscle relaxation, positive visualizations  ProgressTowards Goals Progressiong  Interventions: Exposure Therapy  Summary: Eric Joyce is a 76 y.o. male who presents for today's session on time and was alert, oriented x5, with no evidence or self-report of SI/HI or A/V H.  Patient reported ongoing compliance with medication and denied any use of alcohol or illicit substances.  Clinician inquired about patient's current emotional ratings, as well as any significant changes in thoughts, feelings or behaviors since previous session.  Patient reported scores of 3/10 for depression, 3/10 for anxiety, 0/10 for anger/irritability.  Cln and pt reviewed his emotional ratings and coping skills used. Cln discussed pt using Exposure Therapy between sessions, challenging him to go  in  crowded places prepared: breathing exercises, meditation, relaxation techniques, positive visualizations. Afterwards, the cln and pt will explore how the coping skills worked. "It worked well." Pt reported on his exercise program at the [place] and working with his exercise person in [place], total 5 days a week. Cln inquired of patient's interests, encouraged him to improve his quality of life. Pt discussed relationships; his sister, who lives in [place] and his daughter who lives in [place]. Suggested pt continue to use exposure therapy during this week.  Suicidal/Homicidal: Nowithout intent/plan  Plan: Return again in 1 week.  Diagnosis: PTSD (post-traumatic stress disorder)  Collaboration of Care: Other: Continue seeing providers at [facility] was advised Release of Information must be obtained prior to any record release in order to collaborate their care with an outside provider. Patient/Guardian was advised if they have not already done so to contact the registration department to sign all necessary forms in order for [place] to release information regarding their care.   Consent: Patient/Guardian gives verbal consent for treatment and assignment of benefits for services provided during this visit. Patient/Guardian expressed understanding and agreed to proceed.   [place], LCAS 11/28/2021

## 2021-11-29 DIAGNOSIS — E1140 Type 2 diabetes mellitus with diabetic neuropathy, unspecified: Secondary | ICD-10-CM | POA: Diagnosis not present

## 2021-11-29 DIAGNOSIS — E785 Hyperlipidemia, unspecified: Secondary | ICD-10-CM | POA: Diagnosis not present

## 2021-11-29 DIAGNOSIS — Z23 Encounter for immunization: Secondary | ICD-10-CM | POA: Diagnosis not present

## 2021-11-29 DIAGNOSIS — I2510 Atherosclerotic heart disease of native coronary artery without angina pectoris: Secondary | ICD-10-CM | POA: Diagnosis not present

## 2021-12-04 ENCOUNTER — Ambulatory Visit (INDEPENDENT_AMBULATORY_CARE_PROVIDER_SITE_OTHER): Payer: No Typology Code available for payment source | Admitting: Licensed Clinical Social Worker

## 2021-12-04 DIAGNOSIS — F4310 Post-traumatic stress disorder, unspecified: Secondary | ICD-10-CM

## 2021-12-06 ENCOUNTER — Encounter (HOSPITAL_COMMUNITY): Payer: Self-pay | Admitting: Licensed Clinical Social Worker

## 2021-12-06 NOTE — Progress Notes (Addendum)
   THERAPIST PROGRESS NOTE  Virtual Visit via Video Note   I connected with Early on 12/06/21 at 10:00 AM EDT by a video enabled telemedicine application and verified that I am speaking with the correct person using two identifiers.   Location: Patient: Home Provider: Home Office   I discussed the limitations of evaluation and management by telemedicine and the availability of in person appointments. The patient expressed understanding and agreed to proceed.      Session Time: 10:00-11:00 am  Participation Level: Active  Behavioral Response: CasualAlertDepressed  Type of Therapy: Individual Therapy  Treatment Goals addressed: Meet with clinician in person x1 every 2 weeks for therapy to monitor for progress towards goals and address any barriers to success; Reduce depression from average severity level of 6/10 down to a 4/10 in next 90 days by engaging in 3-5 positive coping skills for 1-2 hours per day as part of developing self-care routine; Reduce average anxiety level from 7/10 down to 5/10 in next 90 days by utilizing 3-5 relaxation skills/grounding skills per day, such as mindful breathing, progressive muscle relaxation, positive visualizations  ProgressTowards Goals Progressiong  Interventions: Exposure Therapy  Summary: Eric Joyce is a 76 y.o. male who presents for today's session on time and was alert, oriented x5, with no evidence or self-report of SI/HI or A/V H.  Patient reported ongoing compliance with medication and denied any use of alcohol or illicit substances.  Clinician inquired about patient's current emotional ratings, as well as any significant changes in thoughts, feelings or behaviors since previous session.  Patient reported scores of 3/10 for depression, 3/10 for anxiety, 0/10 for anger/irritability.  Cln and pt reviewed his emotional ratings and coping skills used. Pt continues using Exposure Therapy between sessions, challenging him to go  in  crowded places prepared: breathing exercises, meditation, relaxation techniques, positive visualizations, then the pt  explored how the coping skills worked well. Pt completes his exercise program at the [place] this week and continues with his trainer 2x per week. Cln inquired of patient's interests, encouraged him to improve his quality of life. Cln reviewed pt's medication. "The prozac doesn't seem to be working as well as it was." Cln suggested pt reach out to PCP to discuss medications. Pt's brother in law in [place] is having heat surgery so he may have to go down there. Suggested pt continue to use exposure therapy during this week.  Suicidal/Homicidal: Nowithout intent/plan  Plan: Counselor will be on FMLA for 4-6 weeks. This is the last session with patient until I return. Cln and pt discussed having a temporary therapist while clin is out. "I choose to not have an interim therapist while she is on FMLA. Cln gave pt [facility] emergency situations[facility]   Suicidal/Homicidal: Nowithout intent/plan  Diagnosis: PTSD (post-traumatic stress disorder)  Collaboration of Care: Other: Continue seeing providers at [facility] was advised Release of Information must be obtained prior to any record release in order to collaborate their care with an outside provider. Patient/Guardian was advised if they have not already done so to contact the registration department to sign all necessary forms in order for [place] to release information regarding their care.   Consent: Patient/Guardian gives verbal consent for treatment and assignment of benefits for services provided during this visit. Patient/Guardian expressed understanding and agreed to proceed.   [place], LCAS 12/04/21

## 2021-12-26 DIAGNOSIS — I10 Essential (primary) hypertension: Secondary | ICD-10-CM | POA: Diagnosis not present

## 2021-12-26 DIAGNOSIS — E785 Hyperlipidemia, unspecified: Secondary | ICD-10-CM | POA: Diagnosis not present

## 2021-12-26 DIAGNOSIS — F4310 Post-traumatic stress disorder, unspecified: Secondary | ICD-10-CM | POA: Diagnosis not present

## 2021-12-26 DIAGNOSIS — E1142 Type 2 diabetes mellitus with diabetic polyneuropathy: Secondary | ICD-10-CM | POA: Diagnosis not present

## 2021-12-26 DIAGNOSIS — E1169 Type 2 diabetes mellitus with other specified complication: Secondary | ICD-10-CM | POA: Diagnosis not present

## 2021-12-26 DIAGNOSIS — E669 Obesity, unspecified: Secondary | ICD-10-CM | POA: Diagnosis not present

## 2021-12-26 DIAGNOSIS — N400 Enlarged prostate without lower urinary tract symptoms: Secondary | ICD-10-CM | POA: Diagnosis not present

## 2021-12-26 DIAGNOSIS — F17200 Nicotine dependence, unspecified, uncomplicated: Secondary | ICD-10-CM | POA: Diagnosis not present

## 2021-12-26 DIAGNOSIS — F331 Major depressive disorder, recurrent, moderate: Secondary | ICD-10-CM | POA: Diagnosis not present

## 2021-12-26 DIAGNOSIS — R32 Unspecified urinary incontinence: Secondary | ICD-10-CM | POA: Diagnosis not present

## 2021-12-26 DIAGNOSIS — E1165 Type 2 diabetes mellitus with hyperglycemia: Secondary | ICD-10-CM | POA: Diagnosis not present

## 2021-12-26 DIAGNOSIS — F1021 Alcohol dependence, in remission: Secondary | ICD-10-CM | POA: Diagnosis not present

## 2021-12-31 ENCOUNTER — Encounter (INDEPENDENT_AMBULATORY_CARE_PROVIDER_SITE_OTHER): Payer: Self-pay

## 2022-01-23 DIAGNOSIS — Z0000 Encounter for general adult medical examination without abnormal findings: Secondary | ICD-10-CM | POA: Diagnosis not present

## 2022-02-14 ENCOUNTER — Ambulatory Visit (INDEPENDENT_AMBULATORY_CARE_PROVIDER_SITE_OTHER): Payer: PPO | Admitting: Urology

## 2022-02-14 ENCOUNTER — Encounter: Payer: Self-pay | Admitting: Urology

## 2022-02-14 VITALS — BP 136/80 | HR 71 | Ht 72.0 in | Wt 226.0 lb

## 2022-02-14 DIAGNOSIS — Z125 Encounter for screening for malignant neoplasm of prostate: Secondary | ICD-10-CM | POA: Diagnosis not present

## 2022-02-14 DIAGNOSIS — N3281 Overactive bladder: Secondary | ICD-10-CM

## 2022-02-14 DIAGNOSIS — N529 Male erectile dysfunction, unspecified: Secondary | ICD-10-CM | POA: Diagnosis not present

## 2022-02-14 LAB — BLADDER SCAN AMB NON-IMAGING

## 2022-02-14 MED ORDER — OXYBUTYNIN CHLORIDE ER 10 MG PO TB24: 10.0000 mg | ORAL_TABLET | Freq: Every day | ORAL | 11 refills | Status: DC

## 2022-02-14 NOTE — Patient Instructions (Signed)

## 2022-02-14 NOTE — Progress Notes (Signed)
   02/14/2022 10:06 AM   [place] 06/27/1945 [id number]  Reason for visit: Follow up BPH/OAB, LUTS, questions about PSA screening/testosterone  HPI: 76 year old male who was previously followed by Dr. Eliberto Ivory, as well as at the [facility] and establish care with [place] in December 2022.  He underwent a UroLift with Dr. Eliberto Ivory in 2020 for urinary symptoms of urgency and frequency, which has acutely worsened his symptoms since that time.  PVR and urinalysis were normal at her last visit, and we opted for a trial of oxybutynin 10 mg XL daily for more of an OAB picture.  He reports he noticed significant improvement at first on that medication, but after starting Jardiance for diabetes he has noticed some increased urinary frequency.  Nocturia is minimal with 0-2 times overnight.  He denies any significant urgency or urge incontinence.  PVR is normal today at 143m.  We discussed the effect of Jardiance on urination, and that the timing of this correlates with his increased urinary frequency.  We reviewed options including behavioral strategies of cutting back on diet sodas and tea, increasing the oxybutynin to 15 mg, or considering cystoscopy for further evaluation of his urinary symptoms.  He would like to start with some behavioral strategies, and he will discuss his diabetes management with his PCP.  [place] [facility] those [place] are not available to me.  He also had questions about PSA screening and would like a testosterone checked.  We reviewed the risks and benefits of PSA screening and the AUA guidelines that do not recommend routine screening in men over age 75  There are no prior PSA values within our system to review, but I recommended he reach out to his PCP to see if he has had a PSA drawn in the last few years.  In terms of testosterone, no prior values to review, and it would be reasonable to check a testosterone level this morning, and call with results.  He is no longer sexually  active.  -Behavioral strategies discussed regarding OAB, oxybutynin 10 mg XL refilled.  Okay to increase to 15 if patient desires in the future -Testosterone this morning, call with results -RTC 1 year PVR and symptom check  BBilley Co MD  [address] [place] [id number]([phone]

## 2022-02-15 LAB — TESTOSTERONE: Testosterone: 259 ng/dL — ABNORMAL LOW (ref 264–916)
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
## 2022-02-18 ENCOUNTER — Telehealth: Payer: Self-pay

## 2022-02-18 DIAGNOSIS — R7989 Other specified abnormal findings of blood chemistry: Secondary | ICD-10-CM

## 2022-02-18 NOTE — Telephone Encounter (Signed)
-----   Message from Billey Co, MD sent at 02/18/2022 12:01 PM EST ----- Testosterone level is slightly low at 259. Non-medication strategies to increase the testosterone are exercise especially weightlifting, adequate sleep, and healthy eating.  If he is interested in testosterone replacement options, okay to schedule a repeat morning testosterone level and follow-up with Pricilla Larsson, MD 02/18/2022

## 2022-02-18 NOTE — Telephone Encounter (Signed)
Called pt informed him of the information below. Pt states that he is leaning more towards TRT options as he is already exercising. Pt states that [place] will [place] of [place] and will call us back to schedule lab visit for repeat Testosterone. Testosterone ordered.

## 2022-02-19 ENCOUNTER — Ambulatory Visit (INDEPENDENT_AMBULATORY_CARE_PROVIDER_SITE_OTHER): Payer: No Typology Code available for payment source | Admitting: Psychiatry

## 2022-02-19 ENCOUNTER — Encounter: Payer: Self-pay | Admitting: Psychiatry

## 2022-02-19 VITALS — BP 136/75 | HR 66 | Ht 72.0 in | Wt 231.0 lb

## 2022-02-19 DIAGNOSIS — F1021 Alcohol dependence, in remission: Secondary | ICD-10-CM | POA: Diagnosis not present

## 2022-02-19 DIAGNOSIS — F3341 Major depressive disorder, recurrent, in partial remission: Secondary | ICD-10-CM

## 2022-02-19 NOTE — Patient Instructions (Signed)
Continue bupropion 150 mg daily  Next appointment: 2/15 at 1:30

## 2022-03-15 ENCOUNTER — Telehealth: Payer: Self-pay

## 2022-03-15 ENCOUNTER — Other Ambulatory Visit: Payer: Self-pay | Admitting: Psychiatry

## 2022-03-15 DIAGNOSIS — F1091 Alcohol use, unspecified, in remission: Secondary | ICD-10-CM

## 2022-03-15 NOTE — Telephone Encounter (Signed)
I would like to obtain labs/LFT before proceeding to start naltrexone. Lab order is sent. Could you advise him to get blood test at [facility]? Will plan to order the medication after it is reviewed.

## 2022-03-15 NOTE — Telephone Encounter (Signed)
pt called left message that he does want to go ahead and take the medication for the ETOH abuse.

## 2022-03-19 ENCOUNTER — Other Ambulatory Visit (HOSPITAL_COMMUNITY): Payer: Self-pay | Admitting: Psychiatry

## 2022-03-19 NOTE — Telephone Encounter (Signed)
spoke with patient and he states he will go to [facility] either today or tomorrow.

## 2022-03-20 ENCOUNTER — Telehealth: Payer: Self-pay | Admitting: Psychiatry

## 2022-03-20 ENCOUNTER — Other Ambulatory Visit: Payer: Non-veteran care

## 2022-03-20 ENCOUNTER — Other Ambulatory Visit: Payer: Self-pay | Admitting: Psychiatry

## 2022-03-20 DIAGNOSIS — R7989 Other specified abnormal findings of blood chemistry: Secondary | ICD-10-CM

## 2022-03-20 LAB — COMPREHENSIVE METABOLIC PANEL
ALT: 53 IU/L — ABNORMAL HIGH (ref 0–44)
AST: 38 IU/L (ref 0–40)
Albumin/Globulin Ratio: 2.2 (ref 1.2–2.2)
Albumin: 5.1 g/dL — ABNORMAL HIGH (ref 3.8–4.8)
Alkaline Phosphatase: 115 IU/L (ref 44–121)
BUN/Creatinine Ratio: 16 (ref 10–24)
BUN: 15 mg/dL (ref 8–27)
Bilirubin Total: 0.7 mg/dL (ref 0.0–1.2)
CO2: 26 mmol/L (ref 20–29)
Calcium: 9.9 mg/dL (ref 8.6–10.2)
Chloride: 99 mmol/L (ref 96–106)
Creatinine, Ser: 0.92 mg/dL (ref 0.76–1.27)
Globulin, Total: 2.3 g/dL (ref 1.5–4.5)
Glucose: 130 mg/dL — ABNORMAL HIGH (ref 70–99)
Potassium: 4.5 mmol/L (ref 3.5–5.2)
Sodium: 141 mmol/L (ref 134–144)
Total Protein: 7.4 g/dL (ref 6.0–8.5)
eGFR: 87 mL/min/{1.73_m2} (ref >59)

## 2022-03-20 MED ORDER — ACAMPROSATE CALCIUM 333 MG PO TBEC: 333.0000 mg | DELAYED_RELEASE_TABLET | Freq: Three times a day (TID) | ORAL | 1 refills | Status: DC

## 2022-03-20 NOTE — Telephone Encounter (Signed)
Please contact the patient.   - Blood test shows slight elevation in liver enzyme. Although will plan to recheck this in a few months, please make sure to let his PCP be aware of this.  - will start acamprosate 333 mg three times a day for alcohol abstinence. This medication is chosen given it can be safely started for people who may have issues with liver. Order is sent.   Please let me know if any questions.

## 2022-03-21 LAB — TESTOSTERONE: Testosterone: 289 ng/dL (ref 264–916)

## 2022-03-22 NOTE — Telephone Encounter (Signed)
left message. pt notified.

## 2022-04-01 NOTE — Progress Notes (Unsigned)
04/02/2022 9:44 PM   Eric Joyce 03/02/1946 [id number]  Referring provider: Earlie Server, MD [place],  [place] [address]  Urological history: 1. BPH with LU TS -I PSS ***  2. ED -contributing factors of age, BPH, HTN, diabetes, HLD, CAD, anxiety, depression, history of smoking, history of alcohol abuse, sleep apnea and obesity -SHIM *** -sildenafil 100 mg, on-demand-dosing  3. Nephrolithiasis -CT (04/2021) -  2 sub 5 mm nonobstructing calculi within the lower pole left kidney.  4. Renal cysts -CT (04/2021) - Small bilateral renal cortical cysts  5. OAB -contributing factors of age, BPH, obesity, HTN, diabetes, depression, anxiety, sleep apnea and history of smoking.  -PVR *** -oxybutynin XL 10 mg daily ***  No chief complaint on file.   HPI: Eric Joyce is a 77 y.o. male who presents today for to discuss testosterone results.    AM Testosterone level (03/2022) 289 AM Testosterone level (03/2022) 259    Score:  1-7 Mild 8-19 Moderate 20-35 Severe      Score: 1-7 Severe ED 8-11 Moderate ED 12-16 Mild-Moderate ED 17-21 Mild ED 22-25 No ED    PMH: Past Medical History:  Diagnosis Date   Diabetes mellitus without complication ([place])    Hyperlipidemia    Hypertension    Myocardial infarction [facility])     Surgical History: Past Surgical History:  Procedure Laterality Date   APPENDECTOMY     CARDIAC SURGERY     2 stents   EYE SURGERY     REPLACEMENT TOTAL KNEE Right    TONSILLECTOMY AND ADENOIDECTOMY      Home Medications:  Allergies as of 04/02/2022       Reactions   Sulfamethoxazole-trimethoprim         Medication List        Accurate as of April 01, 2022  9:44 PM. If you have any questions, ask your nurse or doctor.          acamprosate 333 MG tablet Commonly known as: CAMPRAL Take 1 tablet (333 mg total) by mouth 3 (three) times daily with meals.   aspirin EC 81 MG tablet Take 1 tablet (81 mg total)  by mouth daily. Swallow whole.   atorvastatin 40 MG tablet Commonly known as: LIPITOR Take 40 mg by mouth daily.   Coenzyme Q10 100 MG capsule Take 100 mg by mouth daily.   gabapentin 400 MG capsule Commonly known as: NEURONTIN Take 400 mg by mouth 3 (three) times daily.   Jardiance 10 MG Tabs tablet Generic drug: empagliflozin Take by mouth daily.   melatonin 3 MG Tabs tablet Take 6 mg by mouth at bedtime.   metFORMIN 500 MG tablet Commonly known as: GLUCOPHAGE Take 500 mg by mouth 2 (two) times daily with a meal.   metoprolol succinate 25 MG 24 hr tablet Commonly known as: TOPROL-XL Take 25 mg by mouth daily.   multivitamin with minerals tablet Take 1 tablet by mouth daily.   oxybutynin 10 MG 24 hr tablet Commonly known as: DITROPAN-XL Take 1 tablet (10 mg total) by mouth daily.        Allergies:  Allergies  Allergen Reactions   Sulfamethoxazole-Trimethoprim     Family History: Family History  Problem Relation Age of Onset   Lung cancer Mother    Heart disease Father    Diabetes Paternal Uncle    Diabetes Paternal Grandmother     Social History:  reports that he has quit smoking. His smoking  use included cigarettes. He has been exposed to tobacco smoke. He has never used smokeless tobacco. He reports that he does not currently use alcohol. He reports that he does not use drugs.  ROS: Pertinent ROS in HPI  Physical Exam: There were no vitals taken for this visit.  Constitutional:  Well nourished. Alert and oriented, No acute distress. HEENT: [place] AT, moist mucus membranes.  Trachea midline, no masses. Cardiovascular: No clubbing, cyanosis, or edema. Respiratory: Normal respiratory effort, no increased work of breathing. GI: Abdomen is soft, non tender, non distended, no abdominal masses. Liver and spleen not palpable.  No hernias appreciated.  Stool sample for occult testing is not indicated.   GU: No CVA tenderness.  No bladder fullness or masses.   Patient with circumcised/uncircumcised phallus. ***Foreskin easily retracted***  Urethral meatus is patent.  No penile discharge. No penile lesions or rashes. Scrotum without lesions, cysts, rashes and/or edema.  Testicles are located scrotally bilaterally. No masses are appreciated in the testicles. Left and right epididymis are normal. Rectal: Patient with  normal sphincter tone. Anus and perineum without scarring or rashes. No rectal masses are appreciated. Prostate is approximately *** grams, *** nodules are appreciated. Seminal vesicles are normal. Skin: No rashes, bruises or suspicious lesions. Lymph: No cervical or inguinal adenopathy. Neurologic: Grossly intact, no focal deficits, moving all 4 extremities. Psychiatric: Normal mood and affect.  Laboratory Data: Lab Results  Component Value Date   WBC 7.1 10/02/2021   HGB 16.2 10/02/2021   HCT 47.5 10/02/2021   MCV 94.2 10/02/2021   PLT 168 10/02/2021    Lab Results  Component Value Date   CREATININE 0.92 03/19/2022     Lab Results  Component Value Date   TESTOSTERONE 289 03/20/2022    Lab Results  Component Value Date   HGBA1C 6.6 (H) 04/05/2021    Lab Results  Component Value Date   TSH 0.952 04/04/2021    Lab Results  Component Value Date   AST 38 03/19/2022   Lab Results  Component Value Date   ALT 53 (H) 03/19/2022  I have reviewed the labs.   Pertinent Imaging: N/A  Assessment & Plan:  ***  1. Testosterone deficiency Testosterone deficiency -Near castrate testosterone levels*** -Significant symptoms*** -Recommend starting TRT*** -We discussed the most common forms of replacement including intramuscular injection and gels and he desires to start injections*** -Rx testosterone cypionate-200 mg every 2 weeks to start*** -Appointment will be made for injection training*** -Follow-up 6 weeks after starting TRT for testosterone level and symptom check -Potential side effects of testosterone  replacement were discussed including stimulation of benign prostatic growth with lower urinary tract symptoms; erythrocytosis; edema; gynecomastia; worsening sleep apnea; venous thromboembolism; testicular atrophy and infertility. Recent studies suggesting an increased incidence of heart attack and stroke in patients taking testosterone was discussed. He was informed there is conflicting evidence regarding the impact of testosterone therapy on cardiovascular risk. The theoretical risk of growth stimulation of an undetected prostate cancer was also discussed.  He was informed that current evidence does not provide any definitive answers regarding the risks of testosterone therapy on prostate cancer and cardiovascular disease. The need for periodic monitoring of his testosterone level, PSA, hematocrit and DRE was discussed.      No follow-ups on file.  These notes generated with voice recognition software. I apologize for typographical errors.  [place], [place] [address]  [place], [place] [address] [phone]

## 2022-04-02 ENCOUNTER — Ambulatory Visit (INDEPENDENT_AMBULATORY_CARE_PROVIDER_SITE_OTHER): Payer: PPO | Admitting: Urology

## 2022-04-02 ENCOUNTER — Encounter: Payer: Self-pay | Admitting: Urology

## 2022-04-02 VITALS — BP 127/76 | HR 71 | Ht 72.0 in | Wt 229.0 lb

## 2022-04-02 DIAGNOSIS — N3281 Overactive bladder: Secondary | ICD-10-CM

## 2022-04-02 DIAGNOSIS — E291 Testicular hypofunction: Secondary | ICD-10-CM

## 2022-04-02 DIAGNOSIS — E349 Endocrine disorder, unspecified: Secondary | ICD-10-CM

## 2022-04-02 DIAGNOSIS — Z125 Encounter for screening for malignant neoplasm of prostate: Secondary | ICD-10-CM | POA: Diagnosis not present

## 2022-04-02 LAB — BLADDER SCAN AMB NON-IMAGING: Scan Result: 138

## 2022-04-02 MED ORDER — TESTOSTERONE 20.25 MG/1.25GM (1.62%) TD GEL: 2.0000 | Freq: Every day | TRANSDERMAL | 3 refills | Status: DC

## 2022-04-03 ENCOUNTER — Telehealth: Payer: Self-pay | Admitting: *Deleted

## 2022-04-03 LAB — PSA: Prostate Specific Ag, Serum: 1.7 ng/mL (ref 0.0–4.0)

## 2022-04-03 NOTE — Telephone Encounter (Signed)
Notified patient as instructed, patient is waiting on a prior auth to do done on gel .

## 2022-04-03 NOTE — Telephone Encounter (Signed)
-----  Message from Nori Riis, PA-C sent at 04/03/2022  9:29 AM EST ----- Please let Mr. [place] know that his PSA was fine at 1.7.  Was he able to get the testosterone gel?

## 2022-04-04 NOTE — Telephone Encounter (Signed)
PA received and submitted via covermymeds.  (Key: [id number])

## 2022-04-05 NOTE — Telephone Encounter (Signed)
PA approved thru 10/03/2022. Patient and pharmacy advised.

## 2022-04-16 NOTE — Progress Notes (Unsigned)
BH MD/PA/NP OP Progress Note  04/18/2022 2:00 PM [place]  MRN:  [id number]  Chief Complaint:  Chief Complaint  Patient presents with   Follow-up   HPI:  This is a follow-up appointment for depression and alcohol use disorder.  He states that he differentiates thinks bupropion has been helping for his mood.  Although he used to procrastinate, he thinks he has more purposes.  However, he is not as energetic as he was when he was started on the medication.  He thinks he is reverting back.  He enjoys eating out with his friends.  He had a good trip to [place], and saw his daughter. The patient has mood symptoms as in PHQ-9/GAD-7. He denies SI.   Alcohol-he reports significant improvement in craving.  He decided to start the medication as he was getting worse.  He denies any alcohol use since the wedding of his daughter.  He feels comfortable to stay on the medication.  He denies drug use.   Household: by himself Marital status:never married Number of children: 45 yo daughter (lives in [place]. His mother is [place]) Employment: retired in 2016, works at [employer]. 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 Education:   Last PCP / ongoing medical evaluation:  He is originally from [facility]. He lived in [place] for 11 years for his daughter. He had "platonic" relationship with the mother of his daughter, who is [place].  Visit Diagnosis:    ICD-10-CM   1. Recurrent major depressive disorder, in partial remission ([place])  F33.41     2. Moderate alcohol use disorder, in sustained remission ([facility])  F10.21       Past Psychiatric History: Please see initial evaluation for full details. I have reviewed the history. No updates at this time.     Past Medical History:  Past Medical History:  Diagnosis Date   Diabetes mellitus without complication ([place])    Hyperlipidemia    Hypertension    Myocardial infarction [facility])     Past Surgical History:  Procedure Laterality Date   APPENDECTOMY      CARDIAC SURGERY     2 stents   EYE SURGERY     REPLACEMENT TOTAL KNEE Right    TONSILLECTOMY AND ADENOIDECTOMY      Family Psychiatric History: Please see initial evaluation for full details. I have reviewed the history. No updates at this time.     Family History:  Family History  Problem Relation Age of Onset   Lung cancer Mother    Heart disease Father    Diabetes Paternal Uncle    Diabetes Paternal Grandmother     Social History:  Social History   Socioeconomic History   Marital status: Single    Spouse name: Not on file   Number of children: Not on file   Years of education: Not on file   Highest education level: Not on file  Occupational History   Not on file  Tobacco Use   Smoking status: Former    Types: Cigarettes    Passive exposure: Past   Smokeless tobacco: Never  Substance and Sexual Activity   Alcohol use: Not Currently   Drug use: Never   Sexual activity: Not on file  Other Topics Concern   Not on file  Social History Narrative   Not on file   Social Determinants of Health   Financial Resource Strain: Not on file  Food Insecurity: Not on file  Transportation Needs: Not on file  Physical Activity:  Not on file  Stress: Not on file  Social Connections: Not on file    Allergies:  Allergies  Allergen Reactions   Sulfamethoxazole-Trimethoprim     Metabolic Disorder Labs: Lab Results  Component Value Date   HGBA1C 6.6 (H) 04/05/2021   MPG 143 04/05/2021   No results found for: "PROLACTIN" No results found for: "CHOL", "TRIG", "HDL", "CHOLHDL", "VLDL", "LDLCALC" Lab Results  Component Value Date   TSH 0.952 04/04/2021    Therapeutic Level Labs: No results found for: "LITHIUM" No results found for: "VALPROATE" No results found for: "CBMZ"  Current Medications: Current Outpatient Medications  Medication Sig Dispense Refill   aspirin EC 81 MG tablet Take 1 tablet (81 mg total) by mouth daily. Swallow whole. 90 tablet 3    atorvastatin (LIPITOR) 40 MG tablet Take 40 mg by mouth daily.     buPROPion (WELLBUTRIN XL) 150 MG 24 hr tablet Take 150 mg by mouth daily.     Coenzyme Q10 100 MG capsule Take 100 mg by mouth daily.     empagliflozin (JARDIANCE) 10 MG TABS tablet Take by mouth daily.     gabapentin (NEURONTIN) 400 MG capsule Take 400 mg by mouth 3 (three) times daily.     lisinopril (ZESTRIL) 10 MG tablet Take 1 tablet by mouth daily.     melatonin 3 MG TABS tablet Take 6 mg by mouth at bedtime.     metFORMIN (GLUCOPHAGE) 500 MG tablet Take 500 mg by mouth 2 (two) times daily with a meal.     metoprolol succinate (TOPROL-XL) 25 MG 24 hr tablet Take 25 mg by mouth daily.     Multiple Vitamins-Minerals (MULTIVITAMIN WITH MINERALS) tablet Take 1 tablet by mouth daily.     oxybutynin (DITROPAN-XL) 10 MG 24 hr tablet Take 1 tablet (10 mg total) by mouth daily. 30 tablet 11   Testosterone 20.25 MG/1.25GM (1.62%) GEL Place 2 Pump onto the skin daily. 150 g 3   [START ON 05/19/2022] acamprosate (CAMPRAL) 333 MG tablet Take 1 tablet (333 mg total) by mouth 3 (three) times daily with meals. 90 tablet 0   No current facility-administered medications for this visit.     Musculoskeletal: Strength & Muscle Tone: within normal limits Gait & Station: normal Patient leans: N/A  Psychiatric Specialty Exam: Review of Systems  Psychiatric/Behavioral:  Positive for sleep disturbance. Negative for agitation, behavioral problems, confusion, decreased concentration, dysphoric mood, hallucinations, self-injury and suicidal ideas. The patient is not nervous/anxious and is not hyperactive.   All other systems reviewed and are negative.   Blood pressure 114/75, pulse 62, temperature 98.3 F (36.8 C), temperature source Skin, height 6' (1.829 m), weight 232 lb 6.4 oz (105.4 kg).Body mass index is 31.52 kg/m.  General Appearance: Fairly Groomed  Eye Contact:  Good  Speech:  Clear and Coherent  Volume:  Normal  Mood:   good   Affect:  Appropriate, Congruent, and calm  Thought Process:  Coherent  Orientation:  Full (Time, Place, and Person)  Thought Content: Logical   Suicidal Thoughts:  No  Homicidal Thoughts:  No  Memory:  Immediate;   Good  Judgement:  Good  Insight:  Good  Psychomotor Activity:  Normal  Concentration:  Concentration: Good and Attention Span: Good  Recall:  Good  Fund of Knowledge: Good  Language: Good  Akathisia:  No  Handed:  Right  AIMS (if indicated): not done  Assets:  Communication Skills Desire for Improvement  ADL's:  Intact  Cognition: WNL  Sleep:  Fair   Screenings: GAD-7    Flowsheet Row Office Visit from 04/18/2022 in [place] Counselor from 11/08/2021 in [place] at [facility]  Total GAD-7 Score 2 4      PHQ2-9    [place] Office Visit from 04/18/2022 in [place] Office Visit from 02/19/2022 in [place] Counselor from 11/08/2021 in [place] at [facility] Total Score 2 1 0  PHQ-9 Total Score 5 7 --      Flowsheet Row Counselor from 11/08/2021 in [place] at [facility] ED to Hosp-Admission (Discharged) from 04/04/2021 in [place] [facility] PCU  C-SSRS RISK CATEGORY No Risk No Risk        Assessment and Plan:  Alvia Mark Foglesong is a 77 y.o. year old male with a history of  ITP, splenomegaly, monoclonal lymphocytosis, pulmonary embolism, CAD s/p coronary stents, diabetes with neuropathy, hyperlipidemia, hypertension,  , who is referred for depression.   1. Recurrent major depressive disorder, in partial remission ([place]) # r/o PTSD Acute stressors include:  Other stressors include: deployment to [place]    History:  dx depression when he was in [place], years ago, was on fluoxetine in the past.  Although there has been  overall improvement and depressive symptoms since starting bupropion, he reports mild relapse in lack of energy and anhedonia.  Will uptitrate bupropion to optimize treatment for depression.  He has no known history of seizure.   2. Moderate alcohol use disorder, in sustained remission [facility]) - abstinent since March 7th ,2017 except a few drinks at his daughter's wedding in 2023 He reports significant improvement in craving since starting acamprosate.  Will continue current dose for alcohol abstinence.   # Insomnia - reportedly had sleep test in 2023 at [facility]. Did not hear the result. He is not interested in CPAP machine.      Plan  Increase bupropion 300 mg daily (he gets this medication through [place]. He will notify us if he needs a refill. He would like 90 days to be sent) Continue acamprosate 333 mg three times a day Next appointment: 4/15 at 4:30 for 30 mins, in person - he will find out if he can continue to see Ms. Mckenzie - on gabapentin 900 mg at night    The patient demonstrates the following risk factors for suicide: Chronic risk factors for suicide include: psychiatric disorder of depression . Acute risk factors for suicide include: unemployment. Protective factors for this patient include: coping skills and hope for the future. Considering these factors, the overall suicide risk at this point appears to be low. Patient is appropriate for outpatient follow up.   Collaboration of Care: Collaboration of Care: Other reviewed notes in Epic  Patient/Guardian was advised Release of Information must be obtained prior to any record release in order to collaborate their care with an outside provider. Patient/Guardian was advised if they have not already done so to contact the registration department to sign all necessary forms in order for [place] to release information regarding their care.   Consent: Patient/Guardian gives verbal consent for treatment and assignment of benefits for services provided  during this visit. Patient/Guardian expressed understanding and agreed to proceed.    Norman Clay, MD 04/18/2022, 2:00 PM

## 2022-04-18 ENCOUNTER — Ambulatory Visit (INDEPENDENT_AMBULATORY_CARE_PROVIDER_SITE_OTHER): Payer: No Typology Code available for payment source | Admitting: Psychiatry

## 2022-04-18 ENCOUNTER — Encounter: Payer: Self-pay | Admitting: Psychiatry

## 2022-04-18 VITALS — BP 114/75 | HR 62 | Temp 98.3°F | Ht 72.0 in | Wt 232.4 lb

## 2022-04-18 DIAGNOSIS — F3341 Major depressive disorder, recurrent, in partial remission: Secondary | ICD-10-CM | POA: Diagnosis not present

## 2022-04-18 DIAGNOSIS — F1021 Alcohol dependence, in remission: Secondary | ICD-10-CM | POA: Diagnosis not present

## 2022-04-18 MED ORDER — ACAMPROSATE CALCIUM 333 MG PO TBEC: 333.0000 mg | DELAYED_RELEASE_TABLET | Freq: Three times a day (TID) | ORAL | 0 refills | Status: AC

## 2022-04-18 NOTE — Patient Instructions (Signed)
Increase bupropion 300 mg daily  Continue acamprosate 333 mg three times a day Next appointment: 4/15 at 4:30

## 2022-05-02 ENCOUNTER — Other Ambulatory Visit: Payer: Non-veteran care

## 2022-05-02 ENCOUNTER — Other Ambulatory Visit: Payer: Self-pay

## 2022-05-02 DIAGNOSIS — E291 Testicular hypofunction: Secondary | ICD-10-CM

## 2022-05-02 DIAGNOSIS — E349 Endocrine disorder, unspecified: Secondary | ICD-10-CM

## 2022-05-03 ENCOUNTER — Telehealth: Payer: Self-pay | Admitting: Family Medicine

## 2022-05-03 LAB — TESTOSTERONE: Testosterone: 375 ng/dL (ref 264–916)

## 2022-05-03 LAB — HEMATOCRIT: Hematocrit: 51.2 % — ABNORMAL HIGH (ref 37.5–51.0)

## 2022-05-03 LAB — HEMOGLOBIN: Hemoglobin: 17.5 g/dL (ref 13.0–17.7)

## 2022-05-03 NOTE — Telephone Encounter (Signed)
LMOM for patient to return call.

## 2022-05-03 NOTE — Telephone Encounter (Signed)
-----   Message from Nori Riis, PA-C sent at 05/03/2022 11:20 AM EST ----- Please let Eric Joyce know that his testosterone level is not at therapeutic levels but his hematocrit and hemoglobin are starting to increase.  If this continues, he will need to stop his testosterone therapy.  My concern that if we continue to increase his testosterone gel dose, it will result in subsequent increase of the hemoglobin and hematocrit which would be dangerous.  We can either decide to discontinue the testosterone therapy altogether or he can contact Dr. Tasia Catchings for an appointment to discuss how to manage this going forward if he wants to remain on testosterone therapy.

## 2022-05-03 NOTE — Telephone Encounter (Signed)
Patient notified and will discontinue the testosterone therapy.

## 2022-05-30 DIAGNOSIS — I214 Non-ST elevation (NSTEMI) myocardial infarction: Secondary | ICD-10-CM | POA: Diagnosis not present

## 2022-05-30 DIAGNOSIS — I2510 Atherosclerotic heart disease of native coronary artery without angina pectoris: Secondary | ICD-10-CM | POA: Diagnosis not present

## 2022-05-30 DIAGNOSIS — E785 Hyperlipidemia, unspecified: Secondary | ICD-10-CM | POA: Diagnosis not present

## 2022-05-30 DIAGNOSIS — Z9889 Other specified postprocedural states: Secondary | ICD-10-CM | POA: Diagnosis not present

## 2022-05-30 DIAGNOSIS — E119 Type 2 diabetes mellitus without complications: Secondary | ICD-10-CM | POA: Diagnosis not present

## 2022-05-30 DIAGNOSIS — E1140 Type 2 diabetes mellitus with diabetic neuropathy, unspecified: Secondary | ICD-10-CM | POA: Diagnosis not present

## 2022-06-14 NOTE — Progress Notes (Unsigned)
BH MD/PA/NP OP Progress Note  06/17/2022 5:11 PM Eric Joyce  MRN:  [id number]  Chief Complaint:  Chief Complaint  Patient presents with   Follow-up   HPI:  This is a follow-up appointment for depression, alcohol use disorder in sustained remission.  He states that he has been doing good until 20 minutes ago.  He received a news that his brother in law passed away.  He was sick for many years, and he thinks he is not suffering anymore.  He is planning to visit [place] for this.  He has not up titrated bupropion.  He did not feel the need, and has been doing well.  He has been able to keep himself busy, although he is unsure if bupropion is helping.  He is working on remodeling the house.  He wishes to have some company in his life.  He talks at the loss of his lady a few years ago. 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 He also does meals on wheels. He sleeps well.  He denies feeling depressed or anxiety.  He denies SI.  Although he had an episode of craving for alcohol when he watched a movie, he denies any drinking.  Although he could have reached out to alcohol upon receiving the new, he did not have any craving as he knows that it would not help him to forget things.  He would like to stay off acamprosate at this time, although he agrees to contact the office if he is interested in restarting the medication again.    Wt Readings from Last 3 Encounters:  06/17/22 228 lb 3.2 oz (103.5 kg)  04/18/22 232 lb 6.4 oz (105.4 kg)  04/02/22 229 lb (103.9 kg)    Visit Diagnosis:    ICD-10-CM   1. Recurrent major depressive disorder, in partial remission  F33.41     2. Moderate alcohol use disorder, in sustained remission  F10.21       Past Psychiatric History: Please see initial evaluation for full details. I have reviewed the history. No updates at this time.     Past Medical History:  Past Medical History:  Diagnosis Date   Diabetes mellitus without complication     Hyperlipidemia    Hypertension    Myocardial infarction     Past Surgical History:  Procedure Laterality Date   APPENDECTOMY     CARDIAC SURGERY     2 stents   EYE SURGERY     REPLACEMENT TOTAL KNEE Right    TONSILLECTOMY AND ADENOIDECTOMY      Family Psychiatric History: Please see initial evaluation for full details. I have reviewed the history. No updates at this time.     Family History:  Family History  Problem Relation Age of Onset   Lung cancer Mother    Heart disease Father    Diabetes Paternal Uncle    Diabetes Paternal Grandmother     Social History:  Social History   Socioeconomic History   Marital status: Single    Spouse name: Not on file   Number of children: Not on file   Years of education: Not on file   Highest education level: Not on file  Occupational History   Not on file  Tobacco Use   Smoking status: Former    Types: Cigarettes    Passive exposure: Past   Smokeless tobacco: Never  Substance and Sexual Activity   Alcohol use: Not Currently   Drug use: Never  Sexual activity: Not on file  Other Topics Concern   Not on file  Social History Narrative   Not on file   Social Determinants of Health   Financial Resource Strain: Not on file  Food Insecurity: Not on file  Transportation Needs: Not on file  Physical Activity: Not on file  Stress: Not on file  Social Connections: Not on file    Allergies:  Allergies  Allergen Reactions   Sulfamethoxazole-Trimethoprim     Metabolic Disorder Labs: Lab Results  Component Value Date   HGBA1C 6.6 (H) 04/05/2021   MPG 143 04/05/2021   No results found for: "PROLACTIN" No results found for: "CHOL", "TRIG", "HDL", "CHOLHDL", "VLDL", "LDLCALC" Lab Results  Component Value Date   TSH 0.952 04/04/2021    Therapeutic Level Labs: No results found for: "LITHIUM" No results found for: "VALPROATE" No results found for: "CBMZ"  Current Medications: Current Outpatient Medications   Medication Sig Dispense Refill   acamprosate (CAMPRAL) 333 MG tablet Take 1 tablet (333 mg total) by mouth 3 (three) times daily with meals. 90 tablet 0   aspirin EC 81 MG tablet Take 1 tablet (81 mg total) by mouth daily. Swallow whole. 90 tablet 3   atorvastatin (LIPITOR) 40 MG tablet Take 40 mg by mouth daily.     buPROPion (WELLBUTRIN XL) 150 MG 24 hr tablet Take 150 mg by mouth daily.     Coenzyme Q10 100 MG capsule Take 100 mg by mouth daily.     empagliflozin (JARDIANCE) 10 MG TABS tablet Take by mouth daily.     gabapentin (NEURONTIN) 400 MG capsule Take 400 mg by mouth 3 (three) times daily.     lisinopril (ZESTRIL) 10 MG tablet Take 1 tablet by mouth daily.     melatonin 3 MG TABS tablet Take 6 mg by mouth at bedtime.     metFORMIN (GLUCOPHAGE) 500 MG tablet Take 500 mg by mouth 2 (two) times daily with a meal.     metoprolol succinate (TOPROL-XL) 25 MG 24 hr tablet Take 25 mg by mouth daily.     Multiple Vitamins-Minerals (MULTIVITAMIN WITH MINERALS) tablet Take 1 tablet by mouth daily.     oxybutynin (DITROPAN-XL) 10 MG 24 hr tablet Take 1 tablet (10 mg total) by mouth daily. 30 tablet 11   No current facility-administered medications for this visit.     Musculoskeletal: Strength & Muscle Tone: within normal limits Gait & Station: normal Patient leans: N/A  Psychiatric Specialty Exam: Review of Systems  Psychiatric/Behavioral:  Negative for agitation, behavioral problems, confusion, decreased concentration, dysphoric mood, hallucinations, self-injury, sleep disturbance and suicidal ideas. The patient is not nervous/anxious and is not hyperactive.   All other systems reviewed and are negative.   Blood pressure (!) 146/66, pulse 65, temperature 97.7 F (36.5 C), temperature source Skin, height 6' (1.829 m), weight 228 lb 3.2 oz (103.5 kg).Body mass index is 30.95 kg/m.  General Appearance: Fairly Groomed  Eye Contact:  Good  Speech:  Clear and Coherent  Volume:  Normal   Mood:   good  Affect:  Appropriate, Congruent, and Full Range  Thought Process:  Coherent  Orientation:  Full (Time, Place, and Person)  Thought Content: Logical   Suicidal Thoughts:  No  Homicidal Thoughts:  No  Memory:  Immediate;   Good  Judgement:  Good  Insight:  Good  Psychomotor Activity:  Normal  Concentration:  Concentration: Good and Attention Span: Good  Recall:  Good  Fund of Knowledge: Good  Language: Good  Akathisia:  No  Handed:  Right  AIMS (if indicated): not done  Assets:  Communication Skills Desire for Improvement  ADL's:  Intact  Cognition: WNL  Sleep:  Good   Screenings: GAD-7    Flowsheet Row Office Visit from 04/18/2022 in [facility] Counselor from 11/08/2021 in [facility] at [facility]  Total GAD-7 Score 2 4      PHQ2-9    Flowsheet Row Office Visit from 04/18/2022 in [facility] Office Visit from 02/19/2022 in [facility] Counselor from 11/08/2021 in [facility] at [facility] Total Score 2 1 0  PHQ-9 Total Score 5 7 --      Flowsheet Row Counselor from 11/08/2021 in [facility] at [facility] ED to Hosp-Admission (Discharged) from 04/04/2021 in [facility] PCU  C-SSRS RISK CATEGORY No Risk No Risk        Assessment and Plan:  Allard Timperley Brummitt is a 77 y.o. year old male with a history of  ITP, splenomegaly, monoclonal lymphocytosis, pulmonary embolism, CAD s/p coronary stents, diabetes with neuropathy, hyperlipidemia, hypertension,  , who is referred for depression.   1. Recurrent major depressive disorder, in partial remission # r/o PTSD Acute stressors include: loss of his brother in law in [place]  Other stressors include: deployment to [place], loss of a lady a few years ago    History:  dx depression when he  was in [place], years ago, was on fluoxetine in the past.  He denies any significant mood symptoms since the last visit.  Although the plan was to uptitrate bupropion given his lack of energy and anhedonia, he reports improvement without adjusting his medication.  Will continue current dose of bupropion to target depression.   2. Moderate alcohol use disorder, in sustained remission - abstinent since March 7th ,2017 except a few drinks at his daughter's wedding in 2023  He self discontinued acamprosate, although she did not have any side effect, and had an episode of craving.  Provided psychoeducation about the effectiveness of this medication.  He will consider restarting the medication if any other episode of craving.   # Insomnia - reportedly had sleep test in 2023 at [facility]. Did not hear the result. He is not interested in CPAP machine.      Plan  Continue bupropion 150 mg daily (he did not try 300 mg) Consider restarting acamprosate 333 mg three times a day (he self discontinued a while ago) Next appointment: 6/13 at 4:30 for 30 mins, in person - he will find out if he can continue to see Ms. Mckenzie - on gabapentin 900 mg at night    The patient demonstrates the following risk factors for suicide: Chronic risk factors for suicide include: psychiatric disorder of depression . Acute risk factors for suicide include: unemployment. Protective factors for this patient include: coping skills and hope for the future. Considering these factors, the overall suicide risk at this point appears to be low. Patient is appropriate for outpatient follow up.   Collaboration of Care: Collaboration of Care: Other reviewed notes in Epic  Patient/Guardian was advised Release of Information must be obtained prior to any record release in order to collaborate their care with an outside provider. Patient/Guardian was advised if they have not already done so to contact the registration department to sign all  necessary forms in order for  [place] to release information regarding their care.   Consent: Patient/Guardian gives verbal consent for treatment and assignment of benefits for services provided during this visit. Patient/Guardian expressed understanding and agreed to proceed.    Neysa Hotter, MD 06/17/2022, 5:11 PM

## 2022-06-17 ENCOUNTER — Encounter: Payer: Self-pay | Admitting: Psychiatry

## 2022-06-17 ENCOUNTER — Ambulatory Visit (INDEPENDENT_AMBULATORY_CARE_PROVIDER_SITE_OTHER): Payer: No Typology Code available for payment source | Admitting: Psychiatry

## 2022-06-17 VITALS — BP 146/66 | HR 65 | Temp 97.7°F | Ht 72.0 in | Wt 228.2 lb

## 2022-06-17 DIAGNOSIS — F1021 Alcohol dependence, in remission: Secondary | ICD-10-CM | POA: Diagnosis not present

## 2022-06-17 DIAGNOSIS — F3341 Major depressive disorder, recurrent, in partial remission: Secondary | ICD-10-CM | POA: Diagnosis not present

## 2022-06-17 NOTE — Patient Instructions (Signed)
Continue bupropion 150 mg daily Consider restarting acamprosate 333 mg three times a day Next appointment: 6/13 at 4:30

## 2022-07-02 DIAGNOSIS — E113393 Type 2 diabetes mellitus with moderate nonproliferative diabetic retinopathy without macular edema, bilateral: Secondary | ICD-10-CM | POA: Diagnosis not present

## 2022-08-11 NOTE — Progress Notes (Deleted)
BH MD/PA/NP OP Progress Note  08/11/2022 11:33 AM Eric Joyce  MRN:  [id number]  Chief Complaint: No chief complaint on file.  HPI: *** Visit Diagnosis: No diagnosis found.  Past Psychiatric History: Please see initial evaluation for full details. I have reviewed the history. No updates at this time.     Past Medical History:  Past Medical History:  Diagnosis Date   Diabetes mellitus without complication (HCC)    Hyperlipidemia    Hypertension    Myocardial infarction [facility])     Past Surgical History:  Procedure Laterality Date   APPENDECTOMY     CARDIAC SURGERY     2 stents   EYE SURGERY     REPLACEMENT TOTAL KNEE Right    TONSILLECTOMY AND ADENOIDECTOMY      Family Psychiatric History: Please see initial evaluation for full details. I have reviewed the history. No updates at this time.     Family History:  Family History  Problem Relation Age of Onset   Lung cancer Mother    Heart disease Father    Diabetes Paternal Uncle    Diabetes Paternal Grandmother     Social History:  Social History   Socioeconomic History   Marital status: Single    Spouse name: Not on file   Number of children: Not on file   Years of education: Not on file   Highest education level: Not on file  Occupational History   Not on file  Tobacco Use   Smoking status: Former    Types: Cigarettes    Passive exposure: Past   Smokeless tobacco: Never  Substance and Sexual Activity   Alcohol use: Not Currently   Drug use: Never   Sexual activity: Not on file  Other Topics Concern   Not on file  Social History Narrative   Not on file   Social Determinants of Health   Financial Resource Strain: Not on file  Food Insecurity: Not on file  Transportation Needs: Not on file  Physical Activity: Not on file  Stress: Not on file  Social Connections: Not on file    Allergies:  Allergies  Allergen Reactions   Sulfamethoxazole-Trimethoprim     Metabolic Disorder Labs: Lab  Results  Component Value Date   HGBA1C 6.6 (H) 04/05/2021   MPG 143 04/05/2021   No results found for: "PROLACTIN" No results found for: "CHOL", "TRIG", "HDL", "CHOLHDL", "VLDL", "LDLCALC" Lab Results  Component Value Date   TSH 0.952 04/04/2021    Therapeutic Level Labs: No results found for: "LITHIUM" No results found for: "VALPROATE" No results found for: "CBMZ"  Current Medications: Current Outpatient Medications  Medication Sig Dispense Refill   aspirin EC 81 MG tablet Take 1 tablet (81 mg total) by mouth daily. Swallow whole. 90 tablet 3   atorvastatin (LIPITOR) 40 MG tablet Take 40 mg by mouth daily.     buPROPion (WELLBUTRIN XL) 150 MG 24 hr tablet Take 150 mg by mouth daily.     Coenzyme Q10 100 MG capsule Take 100 mg by mouth daily.     empagliflozin (JARDIANCE) 10 MG TABS tablet Take by mouth daily.     gabapentin (NEURONTIN) 400 MG capsule Take 400 mg by mouth 3 (three) times daily.     lisinopril (ZESTRIL) 10 MG tablet Take 1 tablet by mouth daily.     melatonin 3 MG TABS tablet Take 6 mg by mouth at bedtime.     metFORMIN (GLUCOPHAGE) 500 MG tablet Take 500 mg  by mouth 2 (two) times daily with a meal.     metoprolol succinate (TOPROL-XL) 25 MG 24 hr tablet Take 25 mg by mouth daily.     Multiple Vitamins-Minerals (MULTIVITAMIN WITH MINERALS) tablet Take 1 tablet by mouth daily.     oxybutynin (DITROPAN-XL) 10 MG 24 hr tablet Take 1 tablet (10 mg total) by mouth daily. 30 tablet 11   No current facility-administered medications for this visit.     Musculoskeletal: Strength & Muscle Tone:  Normal Gait & Station: normal Patient leans: N/A  Psychiatric Specialty Exam: Review of Systems  There were no vitals taken for this visit.There is no height or weight on file to calculate BMI.  General Appearance: {Appearance:22683}  Eye Contact:  {[facility] EYE CONTACT:22684}  Speech:  Clear and Coherent  Volume:  Normal  Mood:  {B[facility] MOOD:22306}  Affect:  {Affect  (PAA):22687}  Thought Process:  Coherent  Orientation:  Full (Time, Place, and Person)  Thought Content: Logical   Suicidal Thoughts:  {ST/HT (PAA):22692}  Homicidal Thoughts:  {ST/HT (PAA):22692}  Memory:  Immediate;   Good  Judgement:  {Judgement (PAA):22694}  Insight:  {Insight (PAA):22695}  Psychomotor Activity:  Normal  Concentration:  Concentration: Good and Attention Span: Good  Recall:  Good  Fund of Knowledge: Good  Language: Good  Akathisia:  No  Handed:  Right  AIMS (if indicated): not done  Assets:  Communication Skills Desire for Improvement  ADL's:  Intact  Cognition: WNL  Sleep:  {[facility] GOOD/FAIR/POOR:22877}   Screenings: GAD-7    Flowsheet Row Office Visit from 04/18/2022 in [facility] Counselor from 11/08/2021 in [facility] at [facility]  Total GAD-7 Score 2 4      PHQ2-9    Flowsheet Row Office Visit from 04/18/2022 in [facility] Office Visit from 02/19/2022 in [facility] Counselor from 11/08/2021 in [facility] at [facility] Total Score 2 1 0  PHQ-9 Total Score 5 7 --      Flowsheet Row Counselor from 11/08/2021 in [facility] at [facility] ED to Hosp-Admission (Discharged) from 04/04/2021 in [facility] REGIONAL CARDIAC MED PCU  C-SSRS RISK CATEGORY No Risk No Risk        Assessment and Plan:  Eric Joyce is a 77 y.o. year old male with a history of  ITP, splenomegaly, monoclonal lymphocytosis, pulmonary embolism, CAD s/p coronary stents, diabetes with neuropathy, hyperlipidemia, hypertension,  , who is referred for depression.    1. Recurrent major depressive disorder, in partial remission # r/o PTSD Acute stressors include: loss of his brother in law in [place]  Other stressors include: deployment to [place], loss of a lady a few years  ago    History:  dx depression when he was in [place], years ago, was on fluoxetine in the past.  He denies any significant mood symptoms since the last visit.  Although the plan was to uptitrate bupropion given his lack of energy and anhedonia, he reports improvement without adjusting his medication.  Will continue current dose of bupropion to target depression.    2. Moderate alcohol use disorder, in sustained remission - abstinent since March 7th ,2017 except a few drinks at his daughter's wedding in 2023  He self discontinued acamprosate, although she did not have any side effect, and had an episode of craving.  Provided psychoeducation about the effectiveness of this medication.  He  will consider restarting the medication if any other episode of craving.    # Insomnia - reportedly had sleep test in 2023 at [facility] Did not hear the result. He is not interested in CPAP machine.      Plan  Continue bupropion 150 mg daily (he did not try 300 mg) Consider restarting acamprosate 333 mg three times a day (he self discontinued a while ago) Next appointment: 6/13 at 4:30 for 30 mins, in person - he will find out if he can continue to see Ms. Mckenzie - on gabapentin 900 mg at night    The patient demonstrates the following risk factors for suicide: Chronic risk factors for suicide include: psychiatric disorder of depression . Acute risk factors for suicide include: unemployment. Protective factors for this patient include: coping skills and hope for the future. Considering these factors, the overall suicide risk at this point appears to be low. Patient is appropriate for outpatient follow up.   Collaboration of Care: Collaboration of Care: {BH OP Collaboration of Care:21014065}  Patient/Guardian was advised Release of Information must be obtained prior to any record release in order to collaborate their care with an outside provider. Patient/Guardian was advised if they have not already done so to  contact the registration department to sign all necessary forms in order for [place] to release information regarding their care.   Consent: Patient/Guardian gives verbal consent for treatment and assignment of benefits for services provided during this visit. Patient/Guardian expressed understanding and agreed to proceed.    Neysa Hotter, MD 08/11/2022, 11:33 AM

## 2022-08-15 ENCOUNTER — Ambulatory Visit: Payer: No Typology Code available for payment source | Admitting: Psychiatry

## 2022-09-27 NOTE — Progress Notes (Signed)
BH MD/PA/NP OP Progress Note  09/30/2022 9:06 AM Eric Joyce  MRN:  [id number]  Chief Complaint:  Chief Complaint  Patient presents with   Follow-up   HPI:  This is a follow-up appointment for depression, alcohol use disorder in sustained remission.  He states that he wonders if his body gets used to bupropion.  Although he does not have periods of time he has no energy, he feels down at times.  He states that he tends to feel this way when he drinks only occasionally.  He wonders to himself "are you gonna start again."  He did not find the AA meeting to be helpful, although he continues to practice 12 steps.  It was repetitive to him.  Although he has craving for alcohol, he does not think he needs medication.  It was not good for his health.  He used to be drinking as he was bored after retirement.  He is now working on remodeling the house, and helping his friend to go to [place] appointment.  He enjoys reading.  He has middle insomnia, which he mainly attributes to nocturia.  He also tends to stay up late as he want to do things.  He denies significant anxiety.  He enjoyed visiting his sister in [place].  He denies SI.  He would like to try higher dose of bupropion at this time to see how it helps for his mood.   Substance use  Tobacco Alcohol Other substances/  Current  Few drinks on his birthday, drinks once a month, has cravings denies  Past  Drink "heavily", last in 05/09/2014 denies  Past Treatment  (Did not find AA to be helpful)      Wt Readings from Last 3 Encounters:  09/30/22 232 lb 6.4 oz (105.4 kg)  06/17/22 228 lb 3.2 oz (103.5 kg)  04/18/22 232 lb 6.4 oz (105.4 kg)     Household: by himself Marital status:never married Number of children: 63 yo daughter (lives in [place]. His mother is [place]) Employment: retired in 2016, works at [employer]. 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 Education:   He is originally from [place]. He lived in [place] for 11 years because of  his daughter. He had a "platonic" relationship with the mother of his daughter, who is [place].  Visit Diagnosis:    ICD-10-CM   1. MDD (major depressive disorder), recurrent, in partial remission (HCC)  F33.41     2. Moderate alcohol use disorder, in sustained remission (HCC)  F10.21       Past Psychiatric History: Please see initial evaluation for full details. I have reviewed the history. No updates at this time.     Past Medical History:  Past Medical History:  Diagnosis Date   Diabetes mellitus without complication (HCC)    Hyperlipidemia    Hypertension    Myocardial infarction [facility])     Past Surgical History:  Procedure Laterality Date   APPENDECTOMY     CARDIAC SURGERY     2 stents   EYE SURGERY     REPLACEMENT TOTAL KNEE Right    TONSILLECTOMY AND ADENOIDECTOMY      Family Psychiatric History: Please see initial evaluation for full details. I have reviewed the history. No updates at this time.     Family History:  Family History  Problem Relation Age of Onset   Lung cancer Mother    Heart disease Father    Diabetes Paternal Uncle    Diabetes Paternal Grandmother  Social History:  Social History   Socioeconomic History   Marital status: Single    Spouse name: Not on file   Number of children: Not on file   Years of education: Not on file   Highest education level: Not on file  Occupational History   Not on file  Tobacco Use   Smoking status: Former    Types: Cigarettes    Passive exposure: Past   Smokeless tobacco: Never  Substance and Sexual Activity   Alcohol use: Not Currently   Drug use: Never   Sexual activity: Not on file  Other Topics Concern   Not on file  Social History Narrative   Not on file   Social Determinants of Health   Financial Resource Strain: Low Risk  (01/23/2022)   Received from [facility], [facility]   Overall Financial Resource Strain (CARDIA)    Difficulty of Paying  Living Expenses: Not hard at all  Food Insecurity: Patient Declined (01/23/2022)   Received from [facility], [facility]   Hunger Vital Sign    Worried About Running Out of Food in the Last Year: Patient declined    Ran Out of Food in the Last Year: Patient declined  Transportation Needs: Patient Declined (01/23/2022)   Received from [facility], [facility]   PRAPARE - Transportation    In the past 12 months, has lack of transportation kept you from medical appointments or from getting medications?: Patient declined    Lack of Transportation (Non-Medical): Patient declined  Physical Activity: Not on file  Stress: Not on file  Social Connections: Not on file    Allergies:  Allergies  Allergen Reactions   Sulfamethoxazole-Trimethoprim     Metabolic Disorder Labs: Lab Results  Component Value Date   HGBA1C 6.6 (H) 04/05/2021   MPG 143 04/05/2021   No results found for: "PROLACTIN" No results found for: "CHOL", "TRIG", "HDL", "CHOLHDL", "VLDL", "LDLCALC" Lab Results  Component Value Date   TSH 0.952 04/04/2021    Therapeutic Level Labs: No results found for: "LITHIUM" No results found for: "VALPROATE" No results found for: "CBMZ"  Current Medications: Current Outpatient Medications  Medication Sig Dispense Refill   aspirin EC 81 MG tablet Take 1 tablet (81 mg total) by mouth daily. Swallow whole. 90 tablet 3   atorvastatin (LIPITOR) 40 MG tablet Take 40 mg by mouth daily.     buPROPion (WELLBUTRIN XL) 150 MG 24 hr tablet Take 150 mg by mouth daily.     buPROPion (WELLBUTRIN XL) 300 MG 24 hr tablet Take 1 tablet (300 mg total) by mouth daily. 30 tablet 1   Coenzyme Q10 100 MG capsule Take 100 mg by mouth daily.     empagliflozin (JARDIANCE) 10 MG TABS tablet Take by mouth daily.     gabapentin (NEURONTIN) 300 MG capsule Take 300 mg by mouth 2 (two) times daily. 300 mg in am, 600 mg qhs      lisinopril (ZESTRIL) 10 MG tablet Take 1 tablet by mouth daily.     melatonin 3 MG TABS tablet Take 6 mg by mouth at bedtime.     metFORMIN (GLUCOPHAGE) 500 MG tablet Take 500 mg by mouth 2 (two) times daily with a meal.     metoprolol succinate (TOPROL-XL) 25 MG 24 hr tablet Take 25 mg by mouth daily.     Multiple Vitamins-Minerals (MULTIVITAMIN WITH MINERALS) tablet Take 1 tablet by mouth daily.  oxybutynin (DITROPAN-XL) 10 MG 24 hr tablet Take 1 tablet (10 mg total) by mouth daily. 30 tablet 11   No current facility-administered medications for this visit.     Musculoskeletal: Strength & Muscle Tone: within normal limits Gait & Station: normal Patient leans: N/A  Psychiatric Specialty Exam: Review of Systems  Psychiatric/Behavioral:  Positive for sleep disturbance. Negative for agitation, behavioral problems, confusion, decreased concentration, dysphoric mood, hallucinations, self-injury and suicidal ideas. The patient is not nervous/anxious and is not hyperactive.   All other systems reviewed and are negative.   Blood pressure 123/69, pulse 69, temperature (!) 97.1 F (36.2 C), temperature source Skin, height 6' (1.829 m), weight 232 lb 6.4 oz (105.4 kg).Body mass index is 31.52 kg/m.  General Appearance: Fairly Groomed  Eye Contact:  Good  Speech:  Clear and Coherent  Volume:  Normal  Mood:   good  Affect:  Appropriate, Congruent, and calm  Thought Process:  Coherent  Orientation:  Full (Time, Place, and Person)  Thought Content: Logical   Suicidal Thoughts:  No  Homicidal Thoughts:   No  Memory:  Immediate;   Good  Judgement:  Good  Insight:  Good  Psychomotor Activity:  Normal  Concentration:  Concentration: Good and Attention Span: Good  Recall:  Good  Fund of Knowledge: Good  Language: Good  Akathisia:  No  Handed:  Right  AIMS (if indicated): not done  Assets:  Communication Skills Desire for Improvement  ADL's:  Intact  Cognition: WNL  Sleep:  Fair    Screenings: GAD-7    Flowsheet Row Office Visit from 04/18/2022 in [facility] Counselor from 11/08/2021 in [facility] at [facility]  Total GAD-7 Score 2 4      PHQ2-9    Flowsheet Row Office Visit from 04/18/2022 in [facility] Office Visit from 02/19/2022 in [facility] Counselor from 11/08/2021 in [facility] at [facility] Total Score 2 1 0  PHQ-9 Total Score 5 7 --      Flowsheet Row Counselor from 11/08/2021 in [facility] at [facility] ED to Hosp-Admission (Discharged) from 04/04/2021 in [facility]  C-SSRS RISK CATEGORY No Risk No Risk        Assessment and Plan:  Daytron Gulan Lewellyn is a 77 y.o. year old male with a history of  ITP, splenomegaly, monoclonal lymphocytosis, pulmonary embolism, CAD s/p coronary stents, diabetes with neuropathy, hyperlipidemia, hypertension,  , who is referred for depression.     1. MDD (major depressive disorder), recurrent, in partial remission (HCC) # r/o PTSD Acute stressors include: loss of his brother in law in [place]  Other stressors include: deployment to [place], loss of a lady a few years ago    History:  dx depression when he was in [place], years ago, was on fluoxetine in the past. Originally on bupropion 150 mg daily (newly started)  He reports occasional down mood in the context of occasional alcohol use, although there has been overall improvement in fatigue since being on bupropion.  He would like to uptitrate bupropion at this time.  Will uptitrate the dose to optimize treatment for depression.  Discussed potential risk of headache, palpitation.  He has no known history of seizure.   2. Moderate alcohol use disorder, in sustained remission [facility]) - abstinent since March 7th ,2017 except social drink He  reports craving for alcohol,  and has started to drink once a month.  However, he is not in the same pharmacological treatment at this time.  Will continue to assess.    # Insomnia - reportedly had sleep test in 2023 at [facility]. Did not hear the result. He is not interested in CPAP machine.  He reports middle insomnia, which he only attributes to nocturia.  Provided psychoeducation about sleep hygiene.    Plan  Increase bupropion 300 mg daily (uptitrated 09/2022) Next appointment: 9/30 at 8 30, IP - on gabapentin 300 mg am, 600 mg at night  - on cetrizine for itchness   The patient demonstrates the following risk factors for suicide: Chronic risk factors for suicide include: psychiatric disorder of depression . Acute risk factors for suicide include: unemployment. Protective factors for this patient include: coping skills and hope for the future. Considering these factors, the overall suicide risk at this point appears to be low. Patient is appropriate for outpatient follow up.   Past trials of medication: bupropion, trazodone (lethargic), Ambien, melatonin, Xanax   Collaboration of Care: Collaboration of Care: Other reviewed notes in Epic  Patient/Guardian was advised Release of Information must be obtained prior to any record release in order to collaborate their care with an outside provider. Patient/Guardian was advised if they have not already done so to contact the registration department to sign all necessary forms in order for [place] to release information regarding their care.   Consent: Patient/Guardian gives verbal consent for treatment and assignment of benefits for services provided during this visit. Patient/Guardian expressed understanding and agreed to proceed.   The duration of the time spent on the following activities on the date of the encounter was 30 minutes.   Preparing to see the patient (e.g., review of test, records)  Obtaining and/or reviewing separately obtained history   Performing a medically necessary exam and/or evaluation  Counseling and educating the patient/family/caregiver  Ordering medications, tests, or procedures  Documenting clinical information in the electronic or paper health record  Neysa Hotter, MD 09/30/2022, 9:06 AM

## 2022-09-30 ENCOUNTER — Encounter: Payer: Self-pay | Admitting: Psychiatry

## 2022-09-30 ENCOUNTER — Ambulatory Visit (INDEPENDENT_AMBULATORY_CARE_PROVIDER_SITE_OTHER): Payer: PPO | Admitting: Psychiatry

## 2022-09-30 VITALS — BP 123/69 | HR 69 | Temp 97.1°F | Ht 72.0 in | Wt 232.4 lb

## 2022-09-30 DIAGNOSIS — F1021 Alcohol dependence, in remission: Secondary | ICD-10-CM

## 2022-09-30 DIAGNOSIS — F3341 Major depressive disorder, recurrent, in partial remission: Secondary | ICD-10-CM

## 2022-09-30 MED ORDER — BUPROPION HCL ER (XL) 300 MG PO TB24: 300.0000 mg | ORAL_TABLET | Freq: Every day | ORAL | 1 refills | Status: DC

## 2022-09-30 NOTE — Patient Instructions (Signed)
Increase bupropion 300 mg daily Next appointment: 9/30 at 8 30,

## 2022-10-03 ENCOUNTER — Inpatient Hospital Stay: Payer: PPO | Attending: Oncology

## 2022-10-03 ENCOUNTER — Encounter: Payer: Self-pay | Admitting: Oncology

## 2022-10-03 ENCOUNTER — Inpatient Hospital Stay: Payer: PPO | Admitting: Oncology

## 2022-10-03 DIAGNOSIS — Z801 Family history of malignant neoplasm of trachea, bronchus and lung: Secondary | ICD-10-CM | POA: Insufficient documentation

## 2022-10-03 DIAGNOSIS — Z03818 Encounter for observation for suspected exposure to other biological agents ruled out: Secondary | ICD-10-CM | POA: Diagnosis not present

## 2022-10-03 DIAGNOSIS — Z7901 Long term (current) use of anticoagulants: Secondary | ICD-10-CM | POA: Diagnosis not present

## 2022-10-03 DIAGNOSIS — I10 Essential (primary) hypertension: Secondary | ICD-10-CM | POA: Insufficient documentation

## 2022-10-03 DIAGNOSIS — Z86711 Personal history of pulmonary embolism: Secondary | ICD-10-CM | POA: Diagnosis not present

## 2022-10-03 DIAGNOSIS — Z87891 Personal history of nicotine dependence: Secondary | ICD-10-CM | POA: Diagnosis not present

## 2022-10-03 DIAGNOSIS — E119 Type 2 diabetes mellitus without complications: Secondary | ICD-10-CM | POA: Insufficient documentation

## 2022-10-03 DIAGNOSIS — I252 Old myocardial infarction: Secondary | ICD-10-CM | POA: Insufficient documentation

## 2022-10-03 DIAGNOSIS — E785 Hyperlipidemia, unspecified: Secondary | ICD-10-CM | POA: Diagnosis not present

## 2022-10-03 DIAGNOSIS — I700 Atherosclerosis of aorta: Secondary | ICD-10-CM | POA: Diagnosis not present

## 2022-10-03 DIAGNOSIS — R161 Splenomegaly, not elsewhere classified: Secondary | ICD-10-CM | POA: Insufficient documentation

## 2022-10-03 DIAGNOSIS — D72820 Lymphocytosis (symptomatic): Secondary | ICD-10-CM

## 2022-10-03 DIAGNOSIS — D693 Immune thrombocytopenic purpura: Secondary | ICD-10-CM | POA: Diagnosis not present

## 2022-10-03 DIAGNOSIS — Z7982 Long term (current) use of aspirin: Secondary | ICD-10-CM | POA: Diagnosis not present

## 2022-10-03 DIAGNOSIS — Z79899 Other long term (current) drug therapy: Secondary | ICD-10-CM | POA: Diagnosis not present

## 2022-10-03 DIAGNOSIS — Z7984 Long term (current) use of oral hypoglycemic drugs: Secondary | ICD-10-CM | POA: Diagnosis not present

## 2022-10-03 LAB — COMPREHENSIVE METABOLIC PANEL
ALT: 42 U/L (ref 0–44)
AST: 36 U/L (ref 15–41)
Albumin: 4.5 g/dL (ref 3.5–5.0)
Alkaline Phosphatase: 84 U/L (ref 38–126)
Anion gap: 9 (ref 5–15)
BUN: 17 mg/dL (ref 8–23)
CO2: 25 mmol/L (ref 22–32)
Calcium: 9.4 mg/dL (ref 8.9–10.3)
Chloride: 103 mmol/L (ref 98–111)
Creatinine, Ser: 0.85 mg/dL (ref 0.61–1.24)
GFR, Estimated: >60 mL/min (ref >60)
Glucose, Bld: 100 mg/dL — ABNORMAL HIGH (ref 70–99)
Potassium: 4.1 mmol/L (ref 3.5–5.1)
Sodium: 137 mmol/L (ref 135–145)
Total Bilirubin: 0.9 mg/dL (ref 0.3–1.2)
Total Protein: 7.8 g/dL (ref 6.5–8.1)

## 2022-10-03 LAB — CBC WITH DIFFERENTIAL/PLATELET
Abs Immature Granulocytes: 0.02 10*3/uL (ref 0.00–0.07)
Basophils Absolute: 0.1 10*3/uL (ref 0.0–0.1)
Basophils Relative: 1 %
Eosinophils Absolute: 0.3 10*3/uL (ref 0.0–0.5)
Eosinophils Relative: 3 %
HCT: 49.5 % (ref 39.0–52.0)
Hemoglobin: 17 g/dL (ref 13.0–17.0)
Immature Granulocytes: 0 %
Lymphocytes Relative: 31 %
Lymphs Abs: 2.7 10*3/uL (ref 0.7–4.0)
MCH: 32.2 pg (ref 26.0–34.0)
MCHC: 34.3 g/dL (ref 30.0–36.0)
MCV: 93.8 fL (ref 80.0–100.0)
Monocytes Absolute: 0.7 10*3/uL (ref 0.1–1.0)
Monocytes Relative: 8 %
Neutro Abs: 5 10*3/uL (ref 1.7–7.7)
Neutrophils Relative %: 57 %
Platelets: 186 10*3/uL (ref 150–400)
RBC: 5.28 MIL/uL (ref 4.22–5.81)
RDW: 12.9 % (ref 11.5–15.5)
WBC: 8.7 10*3/uL (ref 4.0–10.5)
nRBC: 0 % (ref 0.0–0.2)

## 2022-10-09 NOTE — Progress Notes (Signed)
error 

## 2022-10-17 ENCOUNTER — Encounter: Payer: Self-pay | Admitting: Oncology

## 2022-10-17 ENCOUNTER — Inpatient Hospital Stay (HOSPITAL_BASED_OUTPATIENT_CLINIC_OR_DEPARTMENT_OTHER): Payer: PPO | Admitting: Oncology

## 2022-10-17 VITALS — BP 97/61 | HR 60 | Temp 97.1°F | Resp 18 | Wt 228.4 lb

## 2022-10-17 DIAGNOSIS — Z86711 Personal history of pulmonary embolism: Secondary | ICD-10-CM

## 2022-10-17 DIAGNOSIS — R161 Splenomegaly, not elsewhere classified: Secondary | ICD-10-CM | POA: Diagnosis not present

## 2022-10-17 DIAGNOSIS — D693 Immune thrombocytopenic purpura: Secondary | ICD-10-CM | POA: Diagnosis not present

## 2022-10-17 DIAGNOSIS — D72820 Lymphocytosis (symptomatic): Secondary | ICD-10-CM | POA: Diagnosis not present

## 2022-10-17 DIAGNOSIS — D696 Thrombocytopenia, unspecified: Secondary | ICD-10-CM | POA: Diagnosis not present

## 2022-10-17 NOTE — Assessment & Plan Note (Signed)
#  Splenomegaly, unknown etiology.   04/30/2021, PET scan showed similar mild splenomegaly with low FDG activity.  No suspicious hypermetabolic splenic foci, adenopathy. Patient is asymptomatic.  Physical examination did not reveal significant splenomegaly Recommend observation.

## 2022-10-17 NOTE — Progress Notes (Signed)
Hematology/Oncology Progress note [phone] [phone]     Patient Care Team: Rickard Patience, MD as PCP - General (Oncology)   REASON FOR VISIT Follow-up for ITP, splenomegaly, monoclonal lymphocytosis, pulmonary embolism.   ASSESSMENT & PLAN:   Monoclonal B-cell lymphocytosis of undetermined significance Monoclonal B-cell lymphocytosis.  Previous bone marrow biopsy showed   2% clonal B cells.    Flowcytometry showed A small CD5+, CD23+ clone was reported  with lambda light chain restriction, <1%, <5000/ul Patient does not have any constitutional symptoms.  CBC shows no leukocytosis/lymphocytosis.  No constitutional symptoms.   Observation.  Thrombocytopenia (HCC) #History of acute ITP, status post steroid course and IVIG x2 Labs are reviewed and discussed with patient. No thrombocytopenia.  Platelet count is normal.  Observation.  History of pulmonary embolism #Provoked acute pulmonary embolism due to immobilization secondary to hospitalization. S/p  total of 6 months of anticoagulation.  negative factor V Leiden mutation and prothrombin gene mutation. Off anticoagulation.  He may take Eliquis 2.5 mg twice daily prophylactically if he expect any immobilization circumstances long distance car ride, flights, postsurgery, etc.  Splenomegaly #Splenomegaly, unknown etiology.   04/30/2021, PET scan showed similar mild splenomegaly with low FDG activity.  No suspicious hypermetabolic splenic foci, adenopathy. Patient is asymptomatic.  Physical examination did not reveal significant splenomegaly Recommend observation.   Orders Placed This Encounter  Procedures   CBC with Differential (Cancer Center Only)    Standing Status:   Future    Standing Expiration Date:   10/17/2023   CMP (Cancer Center only)    Standing Status:   Future    Standing Expiration Date:   10/17/2023   Flow cytometry panel-leukemia/lymphoma work-up    Standing Status:   Future     Standing Expiration Date:   10/17/2023   Follow up 1 year All questions were answered. The patient knows to call the clinic with any problems, questions or concerns.  Rickard Patience, MD, PhD [facility] Hematology Oncology 10/17/2022   INTERVAL HISTORY 77 y.o. male presents for follow-up of ITP, splenomegaly, monoclonal lymphocytosis, pulmonary embolism. #04/04/2021 - 04/12/2021 patient was hospitalized for generalized weakness, fever.  Developed melena and diarrhea after treatment of amoxicillin.  He has acute neutropenia, thrombocytopenia.  Patient had extensive work-up during the admission.  Respiratory viral panel negative, GI pathogen panel negative, ANA negative, Lyme disease panel negative, varicella negative, Bartonella negative.  Erlichia negative. CRP is elevated.  ESR is elevated, RPR negative, adequate vitamin B12 and folate.ANA is negative.   Normal fibrinogen.  Normal haptoglobin, Negative hepatitis and HIV.    Flow low cytometry with monoclonal B-cell lymphocytosis of undetermined significance Mildly elevated EBV PCR copies which were not clinically significant per infectious disease specialist.  CMV PCR is negative. PE/superficial venous thrombosis of the right great saphenous vein throughout the calf and thigh.  Anticoagulation was initially held due to severe thrombocytopenia. thrombocytopenia, finished 5 days of dexamethasone 40 mg daily and IVIG x 2 days.  Platelet improved to 49,000 and the patient was discharged on Eliquis 5 mg twice daily with no loading dose.  04/10/2021, bone marrow biopsy showed minor abnormal B-cell population identified, 2% of all cells. CD20, CD5 and CD200 positive.  Extremely dim/negative staining for surface immunoglobulin light chain.  Abundant T cells display nonspecific changes.  04/30/2021, PET scan showed similar moderate splenomegaly with background max SUV between mediastinal blood pool and liver.  No suspicious hypermetabolic splenic foci.  Diffuse  homogeneous might hypermetabolic bone marrow activity.  Nonspecific.  No hypermetabolic cervical/thoracic/abdominal pelvic adenopathy.  Aortic atherosclerosis.  Patient reports feeling well.  He takes Eliquis 5 mg twice daily and tolerates well.  No bleeding events.  Denies any shortness of breath, chest pain, leg swelling. He has not sleep well recently.  INTERVAL HISTORY Eric Joyce is a 77 y.o. male who has above history reviewed by me today presents for follow up visit for management of history of ITP, monoclonal B-cell lymphocytosis. Patient reports feeling well today.  No bleeding, easy bruising.  Patient takes Eliquis 5 mg twice daily and has completed 6 months of anticoagulation.  He  has participated in exercise program and his fatigue level has significantly improved. No unintentional weight loss, night sweats, fever.  Review of Systems  Constitutional:  Negative for appetite change, chills, fatigue, fever and unexpected weight change.  HENT:   Negative for hearing loss and voice change.   Eyes:  Negative for eye problems and icterus.  Respiratory:  Negative for chest tightness, cough and shortness of breath.   Cardiovascular:  Negative for chest pain and leg swelling.  Gastrointestinal:  Negative for abdominal distention and abdominal pain.  Endocrine: Negative for hot flashes.  Genitourinary:  Negative for difficulty urinating, dysuria and frequency.   Musculoskeletal:  Negative for arthralgias.  Skin:  Negative for itching and rash.  Neurological:  Negative for light-headedness and numbness.  Hematological:  Negative for adenopathy. Does not bruise/bleed easily.  Psychiatric/Behavioral:  Negative for confusion.       Allergies  Allergen Reactions   Sulfamethoxazole-Trimethoprim      Past Medical History:  Diagnosis Date   Diabetes mellitus without complication (HCC)    Hyperlipidemia    Hypertension    Myocardial infarction [facility])      Past Surgical History:   Procedure Laterality Date   APPENDECTOMY     CARDIAC SURGERY     2 stents   EYE SURGERY     REPLACEMENT TOTAL KNEE Right    TONSILLECTOMY AND ADENOIDECTOMY      Social History   Socioeconomic History   Marital status: Single    Spouse name: Not on file   Number of children: Not on file   Years of education: Not on file   Highest education level: Not on file  Occupational History   Not on file  Tobacco Use   Smoking status: Former    Types: Cigarettes    Passive exposure: Past   Smokeless tobacco: Never  Substance and Sexual Activity   Alcohol use: Not Currently   Drug use: Never   Sexual activity: Not on file  Other Topics Concern   Not on file  Social History Narrative   Not on file   Social Determinants of Health   Financial Resource Strain: Low Risk  (01/23/2022)   Received from [facility], [facility]   Overall Financial Resource Strain (CARDIA)    Difficulty of Paying Living Expenses: Not hard at all  Food Insecurity: Patient Declined (01/23/2022)   Received from [facility], [facility]   Hunger Vital Sign    Worried About Running Out of Food in the Last Year: Patient declined    Ran Out of Food in the Last Year: Patient declined  Transportation Needs: Patient Declined (01/23/2022)   Received from [facility], [facility]   [facility] - Transportation    In the past 12 months, has lack of transportation kept you from  medical appointments or from getting medications?: Patient declined    Lack of Transportation (Non-Medical): Patient declined  Physical Activity: Not on file  Stress: Not on file  Social Connections: Not on file  Intimate Partner Violence: Not on file    Family History  Problem Relation Age of Onset   Lung cancer Mother    Heart disease Father    Diabetes Paternal Uncle    Diabetes Paternal Grandmother      Current Outpatient  Medications:    aspirin EC 81 MG tablet, Take 1 tablet (81 mg total) by mouth daily. Swallow whole., Disp: 90 tablet, Rfl: 3   atorvastatin (LIPITOR) 40 MG tablet, Take 40 mg by mouth daily., Disp: , Rfl:    buPROPion (WELLBUTRIN XL) 150 MG 24 hr tablet, Take 150 mg by mouth daily., Disp: , Rfl:    buPROPion (WELLBUTRIN XL) 300 MG 24 hr tablet, Take 1 tablet (300 mg total) by mouth daily., Disp: 30 tablet, Rfl: 1   Coenzyme Q10 100 MG capsule, Take 100 mg by mouth daily., Disp: , Rfl:    empagliflozin (JARDIANCE) 10 MG TABS tablet, Take by mouth daily., Disp: , Rfl:    gabapentin (NEURONTIN) 300 MG capsule, Take 300 mg by mouth 2 (two) times daily. 300 mg in am, 600 mg qhs, Disp: , Rfl:    lisinopril (ZESTRIL) 10 MG tablet, Take 1 tablet by mouth daily., Disp: , Rfl:    melatonin 3 MG TABS tablet, Take 6 mg by mouth at bedtime., Disp: , Rfl:    metFORMIN (GLUCOPHAGE) 500 MG tablet, Take 500 mg by mouth 2 (two) times daily with a meal., Disp: , Rfl:    metoprolol succinate (TOPROL-XL) 25 MG 24 hr tablet, Take 25 mg by mouth daily., Disp: , Rfl:    Multiple Vitamins-Minerals (MULTIVITAMIN WITH MINERALS) tablet, Take 1 tablet by mouth daily., Disp: , Rfl:    oxybutynin (DITROPAN-XL) 10 MG 24 hr tablet, Take 1 tablet (10 mg total) by mouth daily., Disp: 30 tablet, Rfl: 11  Physical exam:  Vitals:   10/17/22 1508  BP: 97/61  Pulse: 60  Resp: 18  Temp: (!) 97.1 F (36.2 C)  TempSrc: Tympanic  SpO2: 97%  Weight: 228 lb 6.4 oz (103.6 kg)   Physical Exam Constitutional:      General: He is not in acute distress. HENT:     Head: Normocephalic and atraumatic.  Eyes:     General: No scleral icterus. Cardiovascular:     Rate and Rhythm: Normal rate and regular rhythm.     Heart sounds: Normal heart sounds.  Pulmonary:     Effort: Pulmonary effort is normal. No respiratory distress.     Breath sounds: No wheezing.  Abdominal:     General: Bowel sounds are normal. There is no distension.      Palpations: Abdomen is soft.  Musculoskeletal:        General: No deformity. Normal range of motion.     Cervical back: Normal range of motion and neck supple.  Skin:    General: Skin is warm and dry.     Findings: No erythema or rash.  Neurological:     Mental Status: He is alert and oriented to person, place, and time. Mental status is at baseline.     Cranial Nerves: No cranial nerve deficit.     Coordination: Coordination normal.  Psychiatric:        Mood and Affect: Mood normal.  Latest Ref Rng & Units 10/03/2022    1:22 PM  CMP  Glucose 70 - 99 mg/dL 161   BUN 8 - 23 mg/dL 17   Creatinine 0.96 - 1.24 mg/dL 0.45   Sodium 409 - 811 mmol/L 137   Potassium 3.5 - 5.1 mmol/L 4.1   Chloride 98 - 111 mmol/L 103   CO2 22 - 32 mmol/L 25   Calcium 8.9 - 10.3 mg/dL 9.4   Total Protein 6.5 - 8.1 g/dL 7.8   Total Bilirubin 0.3 - 1.2 mg/dL 0.9   Alkaline Phos 38 - 126 U/L 84   AST 15 - 41 U/L 36   ALT 0 - 44 U/L 42       Latest Ref Rng & Units 10/03/2022    1:22 PM  CBC  WBC 4.0 - 10.5 K/uL 8.7   Hemoglobin 13.0 - 17.0 g/dL 91.4   Hematocrit 78.2 - 52.0 % 49.5   Platelets 150 - 400 K/uL 186     RADIOGRAPHIC STUDIES: I have personally reviewed the radiological images as listed and agreed with the findings in the report. No results found.

## 2022-10-17 NOTE — Assessment & Plan Note (Signed)
#  Provoked acute pulmonary embolism due to immobilization secondary to hospitalization. S/p  total of 6 months of anticoagulation.  negative factor V Leiden mutation and prothrombin gene mutation. Off anticoagulation.  He may take Eliquis 2.5 mg twice daily prophylactically if he expect any immobilization circumstances long distance car ride, flights, postsurgery, etc.

## 2022-10-17 NOTE — Assessment & Plan Note (Signed)
#  History of acute ITP, status post steroid course and IVIG x2 Labs are reviewed and discussed with patient. No thrombocytopenia.  Platelet count is normal.  Observation.

## 2022-10-17 NOTE — Assessment & Plan Note (Addendum)
Monoclonal B-cell lymphocytosis.  Previous bone marrow biopsy showed   2% clonal B cells.    Flowcytometry showed A small CD5+, CD23+ clone was reported  with lambda light chain restriction, <1%, <5000/ul Patient does not have any constitutional symptoms.  CBC shows no leukocytosis/lymphocytosis.  No constitutional symptoms.   Observation.

## 2022-10-29 DIAGNOSIS — E669 Obesity, unspecified: Secondary | ICD-10-CM | POA: Diagnosis not present

## 2022-10-29 DIAGNOSIS — E1142 Type 2 diabetes mellitus with diabetic polyneuropathy: Secondary | ICD-10-CM | POA: Diagnosis not present

## 2022-10-29 DIAGNOSIS — I2510 Atherosclerotic heart disease of native coronary artery without angina pectoris: Secondary | ICD-10-CM | POA: Diagnosis not present

## 2022-10-29 DIAGNOSIS — K5900 Constipation, unspecified: Secondary | ICD-10-CM | POA: Diagnosis not present

## 2022-10-29 DIAGNOSIS — I10 Essential (primary) hypertension: Secondary | ICD-10-CM | POA: Diagnosis not present

## 2022-10-29 DIAGNOSIS — E1169 Type 2 diabetes mellitus with other specified complication: Secondary | ICD-10-CM | POA: Diagnosis not present

## 2022-10-29 DIAGNOSIS — E785 Hyperlipidemia, unspecified: Secondary | ICD-10-CM | POA: Diagnosis not present

## 2022-10-29 DIAGNOSIS — F331 Major depressive disorder, recurrent, moderate: Secondary | ICD-10-CM | POA: Diagnosis not present

## 2022-10-29 DIAGNOSIS — F4310 Post-traumatic stress disorder, unspecified: Secondary | ICD-10-CM | POA: Diagnosis not present

## 2022-10-29 DIAGNOSIS — F1021 Alcohol dependence, in remission: Secondary | ICD-10-CM | POA: Diagnosis not present

## 2022-10-29 DIAGNOSIS — I700 Atherosclerosis of aorta: Secondary | ICD-10-CM | POA: Diagnosis not present

## 2022-10-29 DIAGNOSIS — D709 Neutropenia, unspecified: Secondary | ICD-10-CM | POA: Diagnosis not present

## 2022-11-21 NOTE — Progress Notes (Signed)
BH MD/PA/NP OP Progress Note  12/02/2022 9:20 AM Jeret Goyer [place]  MRN:  [id number]  Chief Complaint:  Chief Complaint  Patient presents with   Follow-up   HPI:  This is a follow-up appointment for depression, anxiety.  She states that there has been no change.  He could not continue higher dose of bupropion as he was feeling uncomfortable.  He tends to feel anxious especially when he goes in public. He tends to feel more dreaded when he tries to go out with people for lunch, although he does not have any issues with people themselves.  He talks about his experience in [place].  He always makes sure that his back is against the wall since his service.  Although he used to have bizarre dream, he does not have it for many years.  Although he used to see a therapist at [place], [place] did not allow him to continue, but recommended to see a psychiatrist prior to seeing this Clinical research associate.  He is willing to discuss this with his PCP.  He has fair sleep.  He denies change in appetite.  He has tried to be busy, doing yard work, living in the house.  He becomes introverted, and feels content being by himself.  He denies SI.  He is willing to try sertraline at this time.   Substance use   Tobacco Alcohol Other substances/  Current   Few drinks on his birthday, drinks once a month, has cravings denies  Past   Drink "heavily", last in 05/09/2014 denies  Past Treatment   (Did not find AA to be helpful)        Household: by himself Marital status:never married Number of children: 76 yo daughter (lives in [place]. His mother is [place]) Employment: retired in 2016, works at [employer]. 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 Education:   He is originally from [place]. He lived in [place] for 11 years because of his daughter. He had a "platonic" relationship with the mother of his daughter, who is [place].  Visit Diagnosis:    ICD-10-CM   1. MDD (major depressive disorder), recurrent episode, mild (HCC)  F33.0      2. Moderate alcohol use disorder, in sustained remission (HCC)  F10.21     3. Anxiety disorder, unspecified type  F41.9       Past Psychiatric History: Please see initial evaluation for full details. I have reviewed the history. No updates at this time.     Past Medical History:  Past Medical History:  Diagnosis Date   Diabetes mellitus without complication (HCC)    Hyperlipidemia    Hypertension    Myocardial infarction [facility])     Past Surgical History:  Procedure Laterality Date   APPENDECTOMY     CARDIAC SURGERY     2 stents   EYE SURGERY     REPLACEMENT TOTAL KNEE Right    TONSILLECTOMY AND ADENOIDECTOMY      Family Psychiatric History: Please see initial evaluation for full details. I have reviewed the history. No updates at this time.    Family History:  Family History  Problem Relation Age of Onset   Lung cancer Mother    Heart disease Father    Diabetes Paternal Uncle    Diabetes Paternal Grandmother     Social History:  Social History   Socioeconomic History   Marital status: Single    Spouse name: Not on file   Number of children: Not on file   Years  of education: Not on file   Highest education level: Not on file  Occupational History   Not on file  Tobacco Use   Smoking status: Former    Types: Cigarettes    Passive exposure: Past   Smokeless tobacco: Never  Substance and Sexual Activity   Alcohol use: Not Currently   Drug use: Never   Sexual activity: Not on file  Other Topics Concern   Not on file  Social History Narrative   Not on file   Social Determinants of Health   Financial Resource Strain: Low Risk  (01/23/2022)   Received from [facility], [facility]   Overall Financial Resource Strain (CARDIA)    Difficulty of Paying Living Expenses: Not hard at all  Food Insecurity: Patient Declined (01/23/2022)   Received from [facility], [facility]   Hunger  Vital Sign    Worried About Running Out of Food in the Last Year: Patient declined    Ran Out of Food in the Last Year: Patient declined  Transportation Needs: Patient Declined (01/23/2022)   Received from [facility], [facility]   PRAPARE - Transportation    In the past 12 months, has lack of transportation kept you from medical appointments or from getting medications?: Patient declined    Lack of Transportation (Non-Medical): Patient declined  Physical Activity: Not on file  Stress: Not on file  Social Connections: Not on file    Allergies:  Allergies  Allergen Reactions   Sulfamethoxazole-Trimethoprim     Metabolic Disorder Labs: Lab Results  Component Value Date   HGBA1C 6.6 (H) 04/05/2021   MPG 143 04/05/2021   No results found for: "PROLACTIN" No results found for: "CHOL", "TRIG", "HDL", "CHOLHDL", "VLDL", "LDLCALC" Lab Results  Component Value Date   TSH 0.952 04/04/2021    Therapeutic Level Labs: No results found for: "LITHIUM" No results found for: "VALPROATE" No results found for: "CBMZ"  Current Medications: Current Outpatient Medications  Medication Sig Dispense Refill   aspirin EC 81 MG tablet Take 1 tablet (81 mg total) by mouth daily. Swallow whole. 90 tablet 3   atorvastatin (LIPITOR) 40 MG tablet Take 40 mg by mouth daily.     buPROPion (WELLBUTRIN XL) 150 MG 24 hr tablet Take 150 mg by mouth daily.     Coenzyme Q10 100 MG capsule Take 100 mg by mouth daily.     empagliflozin (JARDIANCE) 10 MG TABS tablet Take by mouth daily.     gabapentin (NEURONTIN) 300 MG capsule Take 300 mg by mouth 2 (two) times daily. 300 mg in am, 600 mg qhs     lisinopril (ZESTRIL) 10 MG tablet Take 1 tablet by mouth daily.     melatonin 3 MG TABS tablet Take 6 mg by mouth at bedtime.     metFORMIN (GLUCOPHAGE) 500 MG tablet Take 500 mg by mouth 2 (two) times daily with a meal.     metoprolol succinate (TOPROL-XL) 25 MG 24 hr tablet Take  25 mg by mouth daily.     Multiple Vitamins-Minerals (MULTIVITAMIN WITH MINERALS) tablet Take 1 tablet by mouth daily.     oxybutynin (DITROPAN-XL) 10 MG 24 hr tablet Take 1 tablet (10 mg total) by mouth daily. 30 tablet 11   No current facility-administered medications for this visit.     Musculoskeletal: Strength & Muscle Tone: within normal limits Gait & Station: normal Patient leans: N/A  Psychiatric Specialty Exam:  Review of Systems  Psychiatric/Behavioral:  Positive for sleep disturbance. Negative for agitation, behavioral problems, confusion, decreased concentration, dysphoric mood, hallucinations, self-injury and suicidal ideas. The patient is nervous/anxious. The patient is not hyperactive.   All other systems reviewed and are negative.   Blood pressure 122/76, pulse 64, temperature 98.5 F (36.9 C), temperature source Temporal, height 6' (1.829 m), weight 231 lb 9.6 oz (105.1 kg), SpO2 94%.Body mass index is 31.41 kg/m.  General Appearance: Well Groomed  Eye Contact:  Good  Speech:  Clear and Coherent  Volume:  Normal  Mood:   fine  Affect:  Appropriate, Congruent, and calm  Thought Process:  Coherent  Orientation:  Full (Time, Place, and Person)  Thought Content: Logical   Suicidal Thoughts:  No  Homicidal Thoughts:  No  Memory:  Immediate;   Good  Judgement:  Good  Insight:  Good  Psychomotor Activity:  Normal  Concentration:  Concentration: Good and Attention Span: Good  Recall:  Good  Fund of Knowledge: Good  Language: Good  Akathisia:  No  Handed:  Right  AIMS (if indicated): not done  Assets:  Communication Skills Desire for Improvement  ADL's:  Intact  Cognition: WNL  Sleep:  Fair   Screenings: GAD-7    Garment/textile technologist Visit from 12/02/2022 in [facility] Office Visit from 04/18/2022 in [facility] Counselor from 11/08/2021 in [facility]  at [facility]  Total GAD-7 Score 3 2 4       PHQ2-9    Flowsheet Row Office Visit from 12/02/2022 in [facility] Office Visit from 04/18/2022 in [facility] Office Visit from 02/19/2022 in [facility] Counselor from 11/08/2021 in [facility] at [id number] [facility] Total Score 1 2 1  0  PHQ-9 Total Score -- 5 7 --      Flowsheet Row Office Visit from 12/02/2022 in [facility] Counselor from 11/08/2021 in [facility] at [facility] to Hosp-Admission (Discharged) from 04/04/2021 in [facility] PCU  C-SSRS RISK CATEGORY No Risk No Risk No Risk        Assessment and Plan:  Carry Ortez Arnette is a 77 y.o. year old male with a history of  ITP, splenomegaly, monoclonal lymphocytosis, pulmonary embolism, CAD s/p coronary stents, diabetes with neuropathy, hyperlipidemia, hypertension,  , who is referred for depression.    1. MDD (major depressive disorder), recurrent episode, mild (HCC) # Unspecified anxiety disorder # r/o PTSD Acute stressors include: loss of his brother in law in [place]  Other stressors include: deployment to [place]/combat, loss of a lady a few years ago    History:  dx depression when he was in [place], years ago, was on fluoxetine in the past. Originally on bupropion 150 mg daily (newly started)  He reports ongoing anxiety, and some PTSD symptoms, which include hypervigilance.  He had adverse reaction from bupropion, and has been taking lower dose.  Will add veteran to optimize treatment for depression, anxiety and PTSD.  He will greatly benefit from CBT.  He will discuss this with his PCP at [place].  2. Moderate alcohol use disorder, in sustained remission [facility]) - abstinent since March 7th ,2017 except social drink  Overall stable.  Is not interested in  pharmacological treatment.  Will continue to assess.   # Insomnia - reportedly had sleep  test in 2023 at [facility]. Did not hear the result. He is not interested in CPAP machine.  He reports middle insomnia, which he only attributes to nocturia.  Provided psycho education about sleep hygiene.    Plan  Continue bupropion 150 mg daily (300 mg caused him feeling uncomfortable)- he declined a refill Start sertraline 25 mg at night for one week, then 50 mg at night Next appointment: 11/19 at 8:30, IP - on gabapentin 300 mg am, 600 mg at night  - on cetrizine for itchiness   The patient demonstrates the following risk factors for suicide: Chronic risk factors for suicide include: psychiatric disorder of depression . Acute risk factors for suicide include: unemployment. Protective factors for this patient include: coping skills and hope for the future. Considering these factors, the overall suicide risk at this point appears to be low. Patient is appropriate for outpatient follow up.    Past trials of medication: fluoxetine, bupropion, trazodone (lethargic), Ambien, melatonin, Xanax   Collaboration of Care: Collaboration of Care: Other reviewed notes in Epic  Patient/Guardian was advised Release of Information must be obtained prior to any record release in order to collaborate their care with an outside provider. Patient/Guardian was advised if they have not already done so to contact the registration department to sign all necessary forms in order for [place] to release information regarding their care.   Consent: Patient/Guardian gives verbal consent for treatment and assignment of benefits for services provided during this visit. Patient/Guardian expressed understanding and agreed to proceed.    Neysa Hotter, MD 12/02/2022, 9:20 AM

## 2022-11-27 DIAGNOSIS — Z23 Encounter for immunization: Secondary | ICD-10-CM | POA: Diagnosis not present

## 2022-11-27 DIAGNOSIS — I214 Non-ST elevation (NSTEMI) myocardial infarction: Secondary | ICD-10-CM | POA: Diagnosis not present

## 2022-11-27 DIAGNOSIS — Z9889 Other specified postprocedural states: Secondary | ICD-10-CM | POA: Diagnosis not present

## 2022-11-27 DIAGNOSIS — E119 Type 2 diabetes mellitus without complications: Secondary | ICD-10-CM | POA: Diagnosis not present

## 2022-11-27 DIAGNOSIS — I2510 Atherosclerotic heart disease of native coronary artery without angina pectoris: Secondary | ICD-10-CM | POA: Diagnosis not present

## 2022-11-27 DIAGNOSIS — I2699 Other pulmonary embolism without acute cor pulmonale: Secondary | ICD-10-CM | POA: Diagnosis not present

## 2022-11-27 DIAGNOSIS — E785 Hyperlipidemia, unspecified: Secondary | ICD-10-CM | POA: Diagnosis not present

## 2022-11-27 DIAGNOSIS — I10 Essential (primary) hypertension: Secondary | ICD-10-CM | POA: Diagnosis not present

## 2022-12-02 ENCOUNTER — Ambulatory Visit (INDEPENDENT_AMBULATORY_CARE_PROVIDER_SITE_OTHER): Payer: PPO | Admitting: Psychiatry

## 2022-12-02 ENCOUNTER — Encounter: Payer: Self-pay | Admitting: Psychiatry

## 2022-12-02 VITALS — BP 122/76 | HR 64 | Temp 98.5°F | Ht 72.0 in | Wt 231.6 lb

## 2022-12-02 DIAGNOSIS — F1021 Alcohol dependence, in remission: Secondary | ICD-10-CM | POA: Diagnosis not present

## 2022-12-02 DIAGNOSIS — F330 Major depressive disorder, recurrent, mild: Secondary | ICD-10-CM

## 2022-12-02 DIAGNOSIS — F419 Anxiety disorder, unspecified: Secondary | ICD-10-CM | POA: Diagnosis not present

## 2022-12-02 MED ORDER — SERTRALINE HCL 50 MG PO TABS: ORAL_TABLET | ORAL | 1 refills | Status: DC

## 2022-12-02 NOTE — Patient Instructions (Signed)
Continue bupropion 150 mg daily  Start sertraline 25 mg at night for one week, then 50 mg at night Next appointment: 11/19 at 8:30,

## 2023-01-10 ENCOUNTER — Other Ambulatory Visit: Payer: Self-pay | Admitting: Urology

## 2023-01-10 DIAGNOSIS — N3281 Overactive bladder: Secondary | ICD-10-CM

## 2023-01-17 NOTE — Progress Notes (Unsigned)
BH MD/PA/NP OP Progress Note  01/21/2023 9:11 AM Eric Joyce  MRN:  [id number]  Chief Complaint:  Chief Complaint  Patient presents with   Follow-up   HPI:  This is a follow-up appointment for depression, anxiety, alcohol use disorder in partial remission.  He states that he has been feeling better.  Although he usually feels anxious when starting low dose of sertraline, it has been improving.  He went out to see his friend, who he had known through NA meeting.  He is planning to attend a weekly gathering in the morning, and another gathering for NA.  He is considering to go to church, though he has not good support on it.  He has not thought about senior center, and he is willing to seek out.  He states that he has been introvert, and he wonders if it is related to PTSD.  Although he does not like a word "loner," he does not have much friends, and part of this is due to his catheter.  He enjoys seeing his family.  He is hoping to go to [place].  His daughter has lost her father-in-law. The patient has mood symptoms as in PHQ-9/GAD-7. He denies SI.  He has hypervigilance.  He thinks things are going "whole lot better", and would like to stay on the current medication regimen at this time.   Substance use   Tobacco Alcohol Other substances/  Current   Few drinks on his birthday, drinks once a month, denies cravings denies  Past   Drink "heavily", last in 05/09/2014 denies  Past Treatment   (Did not find AA to be helpful)        Household: by himself Marital status:never married Number of children: 76 yo daughter (lives in [place]. 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 Education:   He is originally from [place]. He lived in [place] for 11 years because of his daughter. He had a "platonic" relationship with the mother of his daughter, who is [place].   Wt Readings from Last 3 Encounters:  01/21/23 224 lb 6.4 oz  (101.8 kg)  12/02/22 231 lb 9.6 oz (105.1 kg)  10/17/22 228 lb 6.4 oz (103.6 kg)     Visit Diagnosis:    ICD-10-CM   1. MDD (major depressive disorder), recurrent episode, mild (HCC)  F33.0     2. Anxiety disorder, unspecified type  F41.9     3. Alcohol use disorder in remission  F10.91       Past Psychiatric History: Please see initial evaluation for full details. I have reviewed the history. No updates at this time.     Past Medical History:  Past Medical History:  Diagnosis Date   Diabetes mellitus without complication (HCC)    Hyperlipidemia    Hypertension    Myocardial infarction [facility])     Past Surgical History:  Procedure Laterality Date   APPENDECTOMY     CARDIAC SURGERY     2 stents   EYE SURGERY     REPLACEMENT TOTAL KNEE Right    TONSILLECTOMY AND ADENOIDECTOMY      Family Psychiatric History: Please see initial evaluation for full details. I have reviewed the history. No updates at this time.     Family History:  Family History  Problem Relation Age of Onset   Lung cancer Mother    Heart disease Father    Diabetes Paternal Uncle    Diabetes  Paternal Grandmother     Social History:  Social History   Socioeconomic History   Marital status: Single    Spouse name: Not on file   Number of children: Not on file   Years of education: Not on file   Highest education level: Not on file  Occupational History   Not on file  Tobacco Use   Smoking status: Former    Types: Cigarettes    Passive exposure: Past   Smokeless tobacco: Never  Vaping Use   Vaping status: Some Days  Substance and Sexual Activity   Alcohol use: Not Currently   Drug use: Yes    Types: Marijuana   Sexual activity: Yes  Other Topics Concern   Not on file  Social History Narrative   Not on file   Social Determinants of Health   Financial Resource Strain: Low Risk  (01/23/2022)   Received from [facility], [facility]   Overall  Financial Resource Strain (CARDIA)    Difficulty of Paying Living Expenses: Not hard at all  Food Insecurity: Patient Declined (01/23/2022)   Received from [facility], [facility]   Hunger Vital Sign    Worried About Running Out of Food in the Last Year: Patient declined    Ran Out of Food in the Last Year: Patient declined  Transportation Needs: Patient Declined (01/23/2022)   Received from [facility], [facility]   PRAPARE - Transportation    In the past 12 months, has lack of transportation kept you from medical appointments or from getting medications?: Patient declined    Lack of Transportation (Non-Medical): Patient declined  Physical Activity: Not on file  Stress: Not on file  Social Connections: Not on file    Allergies:  Allergies  Allergen Reactions   Sulfamethoxazole-Trimethoprim     Metabolic Disorder Labs: Lab Results  Component Value Date   HGBA1C 6.6 (H) 04/05/2021   MPG 143 04/05/2021   No results found for: "PROLACTIN" No results found for: "CHOL", "TRIG", "HDL", "CHOLHDL", "VLDL", "LDLCALC" Lab Results  Component Value Date   TSH 0.952 04/04/2021    Therapeutic Level Labs: No results found for: "LITHIUM" No results found for: "VALPROATE" No results found for: "CBMZ"  Current Medications: Current Outpatient Medications  Medication Sig Dispense Refill   aspirin 81 MG chewable tablet Chew 81 mg by mouth daily.     aspirin EC 81 MG tablet Take 1 tablet (81 mg total) by mouth daily. Swallow whole. 90 tablet 3   atorvastatin (LIPITOR) 40 MG tablet Take 40 mg by mouth daily.     buPROPion (WELLBUTRIN XL) 150 MG 24 hr tablet Take 150 mg by mouth daily.     Coenzyme Q10 100 MG capsule Take 100 mg by mouth daily.     empagliflozin (JARDIANCE) 10 MG TABS tablet Take by mouth daily.     gabapentin (NEURONTIN) 300 MG capsule Take 300 mg by mouth 2 (two) times daily. 300 mg in am, 600 mg  qhs     ketoconazole (NIZORAL) 2 % shampoo Apply topically.     lisinopril (ZESTRIL) 10 MG tablet Take 1 tablet by mouth daily.     metFORMIN (GLUCOPHAGE) 500 MG tablet Take 500 mg by mouth 2 (two) times daily with a meal.     metoprolol succinate (TOPROL-XL) 25 MG 24 hr tablet Take 25 mg by mouth daily.     Multiple Vitamins-Minerals (MULTIVITAMIN WITH MINERALS) tablet  Take 1 tablet by mouth daily.     oxybutynin (DITROPAN-XL) 10 MG 24 hr tablet TAKE 1 TABLET BY MOUTH DAILY 30 tablet 11   [START ON 01/31/2023] sertraline (ZOLOFT) 50 MG tablet Take 1 tablet (50 mg total) by mouth at bedtime. 90 tablet 0   No current facility-administered medications for this visit.     Musculoskeletal: Strength & Muscle Tone: within normal limits Gait & Station: normal Patient leans: N/A  Psychiatric Specialty Exam: Review of Systems  Blood pressure 124/78, pulse (!) 55, temperature 98.9 F (37.2 C), temperature source Temporal, height 6' (1.829 m), weight 224 lb 6.4 oz (101.8 kg), SpO2 91%.Body mass index is 30.43 kg/m.  General Appearance: Well Groomed  Eye Contact:  Good  Speech:  Clear and Coherent  Volume:  Normal  Mood:   better  Affect:  Appropriate, Congruent, and Full Range  Thought Process:  Coherent  Orientation:  Full (Time, Place, and Person)  Thought Content: Logical   Suicidal Thoughts:  No  Homicidal Thoughts:  No  Memory:  Immediate;   Good  Judgement:  Good  Insight:  Good  Psychomotor Activity:  Normal  Concentration:  Concentration: Good and Attention Span: Good  Recall:  Good  Fund of Knowledge: Good  Language: Good  Akathisia:  No  Handed:  Right  AIMS (if indicated): not done  Assets:  Communication Skills Desire for Improvement  ADL's:  Intact  Cognition: WNL  Sleep:  Fair   Screenings: GAD-7    Flowsheet Row Office Visit from 01/21/2023 in [facility] Office Visit from 12/02/2022 in [facility] Office Visit from 04/18/2022 in [facility] Counselor from 11/08/2021 in [facility] at [facility]  Total GAD-7 Score 2 3 2 4       PHQ2-9    Flowsheet Row Office Visit from 01/21/2023 in [facility] Office Visit from 12/02/2022 in [facility] Office Visit from 04/18/2022 in [facility] Office Visit from 02/19/2022 in [facility] Counselor from 11/08/2021 in [facility] at [facility] Total Score 3 1 2 1  0  PHQ-9 Total Score 6 -- 5 7 --      Flowsheet Row Office Visit from 01/21/2023 in [facility] Office Visit from 12/02/2022 in [facility] Counselor from 11/08/2021 in [facility] at [facility] RISK CATEGORY No Risk No Risk No Risk        Assessment and Plan:  Eric Joyce is a 77 y.o. year old male with a history of  ITP, splenomegaly, monoclonal lymphocytosis, pulmonary embolism, CAD s/p coronary stents, diabetes with neuropathy, hyperlipidemia, hypertension,  , who is referred for depression.     1. MDD (major depressive disorder), recurrent episode, mild (HCC) 2. Anxiety disorder, unspecified type # r/o PTSD Acute stressors include: loss of his brother in law in [place]  Other stressors include: deployment to [place]/combat, loss of a lady a few years ago    History:  dx depression when he was in [place], years ago, was on fluoxetine in the past. Originally on bupropion 150 mg daily (newly started)  He reports overall improvement in depressive symptoms, anxiety, although he continues to have PTSD symptoms that she has hypervigilance.  He feels comfortable to stay on the current  medication regimen.  Will continue sertraline to target depression, anxiety and PTSD.  Will continue bupropion as adjunctive treatment for depression.Coached behavioral activation. Although he is willing to do the therapy, [facility] not [facility] this.  Will try to see him sooner when there is availability.   3. Alcohol use disorder in remission - abstinent since March 7th ,2017 except social drink   He has sporadic alcohol use, and denies any craving for alcohol.  Provided psychoeducation about possible risk of relapse.  Will continue motivational interview.    # Insomnia - reportedly had sleep test in 2023 at [facility]. Did not hear the result. He is not interested in CPAP machine.  He reports middle insomnia, which he only attributes to nocturia.  Provided psycho education about sleep hygiene.    Plan  Continue bupropion 150 mg daily (300 mg caused him feeling uncomfortable)- he declined a refill Continue sertraline 50 mg at night Next appointment: 11/19 at 8:30, IP - on gabapentin 300 mg am, 600 mg at night  - on cetrizine for itchiness   The patient demonstrates the following risk factors for suicide: Chronic risk factors for suicide include: psychiatric disorder of depression . Acute risk factors for suicide include: unemployment. Protective factors for this patient include: coping skills and hope for the future. Considering these factors, the overall suicide risk at this point appears to be low. Patient is appropriate for outpatient follow up.    Past trials of medication: fluoxetine, bupropion, trazodone (lethargic), Ambien, melatonin, Xanax     The duration of the time spent on the following activities on the date of the encounter was 30 minutes.   Preparing to see the patient (e.g., review of test, records)  Obtaining and/or reviewing separately obtained history  Performing a medically necessary exam and/or evaluation  Counseling and educating the patient/family/caregiver   Ordering medications, tests, or procedures  Referring and communicating with other healthcare professionals (when not reported separately)  Documenting clinical information in the electronic or paper health record  Independently interpreting results of tests/labs and communication of results to the family or caregiver  Care coordination (when not reported separately)   Collaboration of Care: Collaboration of Care: Other reviewed notes in Epic  Patient/Guardian was advised Release of Information must be obtained prior to any record release in order to collaborate their care with an outside provider. Patient/Guardian was advised if they have not already done so to contact the registration department to sign all necessary forms in order for [place] to release information regarding their care.   Consent: Patient/Guardian gives verbal consent for treatment and assignment of benefits for services provided during this visit. Patient/Guardian expressed understanding and agreed to proceed.    Neysa Hotter, MD 01/21/2023, 9:11 AM

## 2023-01-21 ENCOUNTER — Encounter: Payer: Self-pay | Admitting: Psychiatry

## 2023-01-21 ENCOUNTER — Ambulatory Visit (INDEPENDENT_AMBULATORY_CARE_PROVIDER_SITE_OTHER): Payer: PPO | Admitting: Psychiatry

## 2023-01-21 VITALS — BP 124/78 | HR 55 | Temp 98.9°F | Ht 72.0 in | Wt 224.4 lb

## 2023-01-21 DIAGNOSIS — F330 Major depressive disorder, recurrent, mild: Secondary | ICD-10-CM

## 2023-01-21 DIAGNOSIS — F419 Anxiety disorder, unspecified: Secondary | ICD-10-CM

## 2023-01-21 DIAGNOSIS — F1091 Alcohol use, unspecified, in remission: Secondary | ICD-10-CM | POA: Diagnosis not present

## 2023-01-21 MED ORDER — SERTRALINE HCL 50 MG PO TABS: 50.0000 mg | ORAL_TABLET | Freq: Every day | ORAL | 0 refills | Status: DC

## 2023-01-21 NOTE — Patient Instructions (Signed)
Continue bupropion 150 mg daily  Continue sertraline 50 mg at night Next appointment: 11/19 at 8:30

## 2023-01-27 DIAGNOSIS — Z0000 Encounter for general adult medical examination without abnormal findings: Secondary | ICD-10-CM | POA: Diagnosis not present

## 2023-02-20 ENCOUNTER — Ambulatory Visit: Payer: Non-veteran care | Admitting: Urology

## 2023-03-15 NOTE — Progress Notes (Signed)
 BH MD/PA/NP OP Progress Note  03/18/2023 11:21 AM Eric Joyce  MRN:  [id number]  Chief Complaint:  Chief Complaint  Patient presents with   Follow-up   HPI:  This is a follow-up appointment for depression and anxiety.  He states that he had stomach virus on christmas, and strained his leg after going to the gym.  He would not stay at home during the holiday next year.  He usually sees his daughter and his sister, although it has been difficult as they live in [place] and [place] .  Although he thought about moving to [place] , it was too expensive.  He will visit his sister in [place]  in February.  He has not seen her since last April while he visit them to attend funeral of his brother-in-law.  Although he attended CPT at [facility], he feels that nothing will change.  He feels like she is in the tail.  He is always introverted, loner, and he does not want to be around with others.  However, he is also trying to get out of the house.  He tends to procrastinate.  He tends to feel more dread as the day approaches if he were to make plan to see his friend. He has occasional insomnia. He has good appetite. He denies SI. He avoids watching TV program if it is related to war as he has been trying to forget it.   Wt Readings from Last 3 Encounters:  03/18/23 222 lb 9.6 oz (101 kg)  01/21/23 224 lb 6.4 oz (101.8 kg)  12/02/22 231 lb 9.6 oz (105.1 kg)     Substance use   Tobacco Alcohol  Other substances/  Current   Few drinks on his birthday, drinks once a month, denies cravings denies  Past   Drink heavily, last in 05/09/2014 denies  Past Treatment   (Did not find AA to be helpful)        Household: by himself Marital status:never married Number of children: 32 yo daughter (lives in [place]. His mother is [place]) Employment: retired in 2016, works at [employer]. 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 Education:   He is originally from [place]. He lived in [place] for 11 years because of his  daughter. He had a platonic relationship with the mother of his daughter, who is [place].  Visit Diagnosis:    ICD-10-CM   1. MDD (major depressive disorder), recurrent episode, mild (HCC)  F33.0     2. Anxiety disorder, unspecified type  F41.9       Past Psychiatric History: Please see initial evaluation for full details. I have reviewed the history. No updates at this time.     Past Medical History:  Past Medical History:  Diagnosis Date   Diabetes mellitus without complication (HCC)    Hyperlipidemia    Hypertension    Myocardial infarction [facility])     Past Surgical History:  Procedure Laterality Date   APPENDECTOMY     CARDIAC SURGERY     2 stents   EYE SURGERY     REPLACEMENT TOTAL KNEE Right    TONSILLECTOMY AND ADENOIDECTOMY      Family Psychiatric History: Please see initial evaluation for full details. I have reviewed the history. No updates at this time.     Family History:  Family History  Problem Relation Age of Onset   Lung cancer Mother    Heart disease Father    Diabetes Paternal Uncle    Diabetes Paternal Grandmother  Social History:  Social History   Socioeconomic History   Marital status: Single    Spouse name: Not on file   Number of children: Not on file   Years of education: Not on file   Highest education level: Not on file  Occupational History   Not on file  Tobacco Use   Smoking status: Former    Types: Cigarettes    Passive exposure: Past   Smokeless tobacco: Never  Vaping Use   Vaping status: Some Days  Substance and Sexual Activity   Alcohol  use: Not Currently   Drug use: Yes    Types: Marijuana   Sexual activity: Yes  Other Topics Concern   Not on file  Social History Narrative   Not on file   Social Drivers of Health   Financial Resource Strain: Low Risk  (01/27/2023)   Received from [facility]   Overall Financial Resource Strain (CARDIA)    Difficulty of Paying Living Expenses: Not hard  at all  Food Insecurity: No Food Insecurity (01/27/2023)   Received from [facility]   Hunger Vital Sign    Worried About Running Out of Food in the Last Year: Never true    Ran Out of Food in the Last Year: Never true  Transportation Needs: Unknown (01/27/2023)   Received from [facility] - Transportation    In the past 12 months, has lack of transportation kept you from medical appointments or from getting medications?: No    Lack of Transportation (Non-Medical): Patient declined  Physical Activity: Not on file  Stress: Not on file  Social Connections: Not on file    Allergies:  Allergies  Allergen Reactions   Sulfamethoxazole-Trimethoprim     Metabolic Disorder Labs: Lab Results  Component Value Date   HGBA1C 6.6 (H) 04/05/2021   MPG 143 04/05/2021   No results found for: PROLACTIN No results found for: CHOL, TRIG, HDL, CHOLHDL, VLDL, LDLCALC Lab Results  Component Value Date   TSH 0.952 04/04/2021    Therapeutic Level Labs: No results found for: LITHIUM No results found for: VALPROATE No results found for: CBMZ  Current Medications: Current Outpatient Medications  Medication Sig Dispense Refill   aspirin  81 MG chewable tablet Chew 81 mg by mouth daily.     aspirin  EC 81 MG tablet Take 1 tablet (81 mg total) by mouth daily. Swallow whole. 90 tablet 3   atorvastatin (LIPITOR) 40 MG tablet Take 40 mg by mouth daily.     buPROPion  (WELLBUTRIN  XL) 150 MG 24 hr tablet Take 150 mg by mouth daily.     Coenzyme Q10 100 MG capsule Take 100 mg by mouth daily.     empagliflozin (JARDIANCE) 10 MG TABS tablet Take by mouth daily.     gabapentin  (NEURONTIN ) 300 MG capsule Take 300 mg by mouth 2 (two) times daily. 300 mg in am, 600 mg qhs     ketoconazole (NIZORAL) 2 % shampoo Apply topically.     lisinopril (ZESTRIL) 10 MG tablet Take 1 tablet by mouth daily.     metFORMIN (GLUCOPHAGE) 500 MG tablet Take 500 mg  by mouth 2 (two) times daily with a meal.     metoprolol  succinate (TOPROL -XL) 25 MG 24 hr tablet Take 25 mg by mouth daily.     Multiple Vitamins-Minerals (MULTIVITAMIN WITH MINERALS) tablet Take 1 tablet by mouth daily.     oxybutynin  (DITROPAN -XL) 10 MG 24 hr tablet TAKE 1  TABLET BY MOUTH DAILY 30 tablet 11   sertraline  (ZOLOFT ) 100 MG tablet Take 1 tablet (100 mg total) by mouth at bedtime. 90 tablet 0   No current facility-administered medications for this visit.     Musculoskeletal: Strength & Muscle Tone: within normal limits Gait & Station: normal Patient leans: N/A  Psychiatric Specialty Exam: Review of Systems  Psychiatric/Behavioral:  Positive for dysphoric mood and sleep disturbance. Negative for agitation, behavioral problems, confusion, decreased concentration, hallucinations, self-injury and suicidal ideas. The patient is not nervous/anxious and is not hyperactive.   All other systems reviewed and are negative.   Blood pressure 122/72, pulse 66, temperature (!) 96.8 F (36 C), temperature source Skin, height 6' (1.829 m), weight 222 lb 9.6 oz (101 kg).Body mass index is 30.19 kg/m.  General Appearance: Well Groomed  Eye Contact:  Good  Speech:  Clear and Coherent  Volume:  Normal  Mood:   same  Affect:  Appropriate, Congruent, and Full Range  Thought Process:  Coherent  Orientation:  Full (Time, Place, and Person)  Thought Content: Logical   Suicidal Thoughts:  No  Homicidal Thoughts:  No  Memory:  Immediate;   Good  Judgement:  Good  Insight:  Good  Psychomotor Activity:  Normal  Concentration:  Concentration: Good and Attention Span: Good  Recall:  Good  Fund of Knowledge: Good  Language: Good  Akathisia:  No  Handed:  Right  AIMS (if indicated): not done  Assets:  Communication Skills Desire for Improvement  ADL's:  Intact  Cognition: WNL  Sleep:  Fair   Screenings: GAD-7    Flowsheet Row Office Visit from 01/21/2023 in [facility] from 12/02/2022 in [facility] Office Visit from 04/18/2022 in [facility] Counselor from 11/08/2021 in [facility] at [facility]  Total GAD-7 Score 2 3 2 4       PHQ2-9    Flowsheet Row Office Visit from 01/21/2023 in [facility] Office Visit from 12/02/2022 in [facility] Office Visit from 04/18/2022 in [facility] Office Visit from 02/19/2022 in [facility] Counselor from 11/08/2021 in [facility] at [facility] Total Score 3 1 2 1  0  PHQ-9 Total Score 6 -- 5 7 --      Flowsheet Row Office Visit from 01/21/2023 in [facility] Office Visit from 12/02/2022 in [facility] Counselor from 11/08/2021 in [facility] at [facility] RISK CATEGORY No Risk No Risk No Risk        Assessment and Plan:  Tyreek Clabo Gienger is a 77 y.o. year old male with a history of  ITP, splenomegaly, monoclonal lymphocytosis, pulmonary embolism, CAD s/p coronary stents, diabetes with neuropathy, hyperlipidemia, hypertension,  , who is referred for depression.   1. MDD (major depressive disorder), recurrent episode, mild (HCC) 2. Anxiety disorder, unspecified type # r/o PTSD Acute stressors include: loss of his brother in law in [place]   Other stressors include: deployment to [place]/combat, loss of a lady a few years ago    History:  dx depression when he was in [place], years ago, was on fluoxetine in the past. Originally on bupropion  150 mg daily (newly started)  He reports slight worsening in depressive symptoms during holiday season, which he  attributes to  being unable to leave the house due to his physical issues.  He does have PTSD symptoms including hypervigilance and avoidance, although there has been improvement in dream. Will uptitrate sertraline  to optimize treatment for depression and anxiety.  Will continue bupropion  adjunctive treatment for depression.  Although he did try CPT through [facility], he reports limited benefit.  PA does not cover for insurance outside of [facility].  3. Alcohol  use disorder in remission - abstinent since March 7th ,2017 except social drink   He has sporadic alcohol  use, and denies any craving for alcohol .  Provided psychoeducation about possible risk of relapse.  Will continue motivational interview.    # Insomnia - reportedly had sleep test in 2023 at [facility]. Did not hear the result. He is not interested in CPAP machine.  He reports middle insomnia, which he only attributes to nocturia.  Provided psycho education about sleep hygiene.    Plan  Continue bupropion  150 mg daily (300 mg caused him feeling uncomfortable)- he declined a refill Increase sertraline  100 mg at night Next appointment: 3/20 at 10 AM, IP - on gabapentin  300 mg am, 600 mg at night  - on cetrizine for itchiness   The patient demonstrates the following risk factors for suicide: Chronic risk factors for suicide include: psychiatric disorder of depression . Acute risk factors for suicide include: unemployment. Protective factors for this patient include: coping skills and hope for the future. Considering these factors, the overall suicide risk at this point appears to be low. Patient is appropriate for outpatient follow up.    Past trials of medication: fluoxetine, bupropion , trazodone (lethargic), Ambien, melatonin, Xanax    Collaboration of Care: Collaboration of Care: Other reviewed notes in EPic  Patient/Guardian was advised Release of Information must be obtained prior to any record release in order to collaborate their care with an outside provider.  Patient/Guardian was advised if they have not already done so to contact the registration department to sign all necessary forms in order for us  to release information regarding their care.   Consent: Patient/Guardian gives verbal consent for treatment and assignment of benefits for services provided during this visit. Patient/Guardian expressed understanding and agreed to proceed.    Katheren Sleet, MD 03/18/2023, 11:21 AM

## 2023-03-18 ENCOUNTER — Ambulatory Visit (INDEPENDENT_AMBULATORY_CARE_PROVIDER_SITE_OTHER): Payer: PPO | Admitting: Psychiatry

## 2023-03-18 ENCOUNTER — Encounter: Payer: Self-pay | Admitting: Psychiatry

## 2023-03-18 VITALS — BP 122/72 | HR 66 | Temp 96.8°F | Ht 72.0 in | Wt 222.6 lb

## 2023-03-18 DIAGNOSIS — F330 Major depressive disorder, recurrent, mild: Secondary | ICD-10-CM

## 2023-03-18 DIAGNOSIS — F419 Anxiety disorder, unspecified: Secondary | ICD-10-CM | POA: Diagnosis not present

## 2023-03-18 MED ORDER — SERTRALINE HCL 100 MG PO TABS: 100.0000 mg | ORAL_TABLET | Freq: Every day | ORAL | 0 refills | Status: DC

## 2023-03-18 NOTE — Patient Instructions (Signed)
 Continue bupropion 150 mg daily Increase sertraline 100 mg at night Next appointment: 3/20 at 10 AM

## 2023-03-25 DIAGNOSIS — M25561 Pain in right knee: Secondary | ICD-10-CM | POA: Diagnosis not present

## 2023-03-25 DIAGNOSIS — G8929 Other chronic pain: Secondary | ICD-10-CM | POA: Diagnosis not present

## 2023-03-27 DIAGNOSIS — J309 Allergic rhinitis, unspecified: Secondary | ICD-10-CM | POA: Diagnosis not present

## 2023-03-27 DIAGNOSIS — E1169 Type 2 diabetes mellitus with other specified complication: Secondary | ICD-10-CM | POA: Diagnosis not present

## 2023-03-27 DIAGNOSIS — K429 Umbilical hernia without obstruction or gangrene: Secondary | ICD-10-CM | POA: Diagnosis not present

## 2023-03-27 DIAGNOSIS — F3342 Major depressive disorder, recurrent, in full remission: Secondary | ICD-10-CM | POA: Diagnosis not present

## 2023-03-27 DIAGNOSIS — E785 Hyperlipidemia, unspecified: Secondary | ICD-10-CM | POA: Diagnosis not present

## 2023-03-27 DIAGNOSIS — Z794 Long term (current) use of insulin: Secondary | ICD-10-CM | POA: Diagnosis not present

## 2023-03-27 DIAGNOSIS — E663 Overweight: Secondary | ICD-10-CM | POA: Diagnosis not present

## 2023-03-27 DIAGNOSIS — E1142 Type 2 diabetes mellitus with diabetic polyneuropathy: Secondary | ICD-10-CM | POA: Diagnosis not present

## 2023-03-27 DIAGNOSIS — I2510 Atherosclerotic heart disease of native coronary artery without angina pectoris: Secondary | ICD-10-CM | POA: Diagnosis not present

## 2023-03-27 DIAGNOSIS — F419 Anxiety disorder, unspecified: Secondary | ICD-10-CM | POA: Diagnosis not present

## 2023-03-27 DIAGNOSIS — F4312 Post-traumatic stress disorder, chronic: Secondary | ICD-10-CM | POA: Diagnosis not present

## 2023-03-27 DIAGNOSIS — I10 Essential (primary) hypertension: Secondary | ICD-10-CM | POA: Diagnosis not present

## 2023-04-03 ENCOUNTER — Ambulatory Visit (INDEPENDENT_AMBULATORY_CARE_PROVIDER_SITE_OTHER): Payer: PPO | Admitting: Urology

## 2023-04-03 VITALS — BP 117/73 | HR 64 | Ht 72.0 in | Wt 215.4 lb

## 2023-04-03 DIAGNOSIS — N3281 Overactive bladder: Secondary | ICD-10-CM | POA: Diagnosis not present

## 2023-04-03 DIAGNOSIS — N401 Enlarged prostate with lower urinary tract symptoms: Secondary | ICD-10-CM

## 2023-04-03 DIAGNOSIS — Z125 Encounter for screening for malignant neoplasm of prostate: Secondary | ICD-10-CM

## 2023-04-03 DIAGNOSIS — R399 Unspecified symptoms and signs involving the genitourinary system: Secondary | ICD-10-CM

## 2023-04-03 LAB — BLADDER SCAN AMB NON-IMAGING

## 2023-04-03 MED ORDER — GEMTESA 75 MG PO TABS: 75.0000 mg | ORAL_TABLET | Freq: Every day | ORAL | Status: DC

## 2023-04-03 NOTE — Progress Notes (Signed)
   04/03/2023 8:42 AM   Sharlett Iles Toscano 09-12-1945 [id number]  Reason for visit: Follow up BPH/OAB/LUTS, PSA screening, hypogonadism  HPI: 78 year old male [facility] [place] [facility] in December 2022 for the above issues.  He underwent a UroLift with Dr. Sheppard Penton in 2020 for urinary symptoms of urgency and frequency which acutely worsened his symptoms.  He opted for trial of oxybutynin 10 mg XL daily mid December 2022 which improved his symptoms fairly significantly.    He does feel over the last year oxybutynin has been less effective and he has bothersome dry mouth, interested in other treatment options.  PVR today normal at 136 ml.  We discussed trial of a beta 3 agonist like Gemtesa or Myrbetriq, and Gemtesa samples were provided.  He would like to reach out to [place] via MyChart if this is helpful for a prescription.  Could also consider cystoscopy in the future if worsening symptoms.  At our visit in December 2023 he requested both PSA and testosterone levels.  PSA was normal at 1.7, testosterone was low at 259, and 289 on repeat.  He was started on testosterone injections by Michiel Cowboy, PA, but denied any significant improvement in symptoms on that medication and reportedly had some abnormal lab values and that was discontinued.  He would like to stay off testosterone.  Reassurance provided regarding normal PSA, no further PSA screening needed per the guideline recommendations.  Trial of Gemtesa to replace Flomax, samples given, okay to fill prescription if helpful.  He prefers to contact us via MyChart RTC 1 year PVR  Sondra Come, MD  [facility] [address], [address] [place], [place] [address] [phone]

## 2023-04-07 ENCOUNTER — Telehealth: Payer: Self-pay | Admitting: Psychiatry

## 2023-04-07 NOTE — Telephone Encounter (Signed)
I understand that the medication in question is typically indicated for diabetes, and it is not one that I prescribe. I would recommend that he reach out to his healthcare provider for further guidance. If he is experiencing difficulty obtaining his medication, he may also consider visiting a walk-in clinic for assistance.

## 2023-04-07 NOTE — Telephone Encounter (Signed)
Patient gets his Empagliflozin 10mg  from the [place]. You have not prescribed this he states. [facility] and he will not get in time to leave for [facility] Sunday, Feb. 9 and will not be back for 3 weeks. He is requesting whatever help you can give him for this prescription. Can you help him? If so send to [facility] in [place]

## 2023-05-18 NOTE — Progress Notes (Deleted)
 BH MD/PA/NP OP Progress Note  05/18/2023 10:27 AM Quintavious Rinck Teed  MRN:  [id number]  Chief Complaint: No chief complaint on file.  HPI: ***  Substance use   Tobacco Alcohol Other substances/  Current   Few drinks on his birthday, drinks once a month, denies cravings denies  Past   Drink "heavily", last in 05/09/2014 denies  Past Treatment   (Did not find AA to be helpful)        Household: by himself Marital status:never married Number of children: 77 yo daughter (lives in [place]. His mother is [place]) Employment: retired in 2016, works at [employer]. 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 Education:   He is originally from [place]. He lived in [place] for 11 years because of his daughter. He had a "platonic" relationship with the mother of his daughter, who is [place].    Visit Diagnosis: No diagnosis found.  Past Psychiatric History: Please see initial evaluation for full details. I have reviewed the history. No updates at this time.     Past Medical History:  Past Medical History:  Diagnosis Date   Diabetes mellitus without complication (HCC)    Hyperlipidemia    Hypertension    Myocardial infarction [facility])     Past Surgical History:  Procedure Laterality Date   APPENDECTOMY     CARDIAC SURGERY     2 stents   EYE SURGERY     REPLACEMENT TOTAL KNEE Right    TONSILLECTOMY AND ADENOIDECTOMY      Family Psychiatric History: Please see initial evaluation for full details. I have reviewed the history. No updates at this time.     Family History:  Family History  Problem Relation Age of Onset   Lung cancer Mother    Heart disease Father    Diabetes Paternal Uncle    Diabetes Paternal Grandmother     Social History:  Social History   Socioeconomic History   Marital status: Single    Spouse name: Not on file   Number of children: Not on file   Years of education: Not on file   Highest education level: Not on file  Occupational History   Not on file   Tobacco Use   Smoking status: Former    Types: Cigarettes    Passive exposure: Past   Smokeless tobacco: Never  Vaping Use   Vaping status: Some Days  Substance and Sexual Activity   Alcohol use: Not Currently   Drug use: Yes    Types: Marijuana   Sexual activity: Yes  Other Topics Concern   Not on file  Social History Narrative   Not on file   Social Drivers of Health   Financial Resource Strain: Low Risk  (01/27/2023)   Received [facility]   Overall Financial Resource Strain (CARDIA)    Difficulty of Paying Living Expenses: Not hard at all  Food Insecurity: No Food Insecurity (01/27/2023)   Received from [facility]   Hunger Vital Sign    Worried About Running Out of Food in the Last Year: Never true    Ran Out of Food in the Last Year: Never true  Transportation Needs: Unknown (01/27/2023)   [facility] from [facility] - Transportation    In the past 12 months, has lack of transportation kept you from medical appointments or from getting medications?: No    Lack of Transportation (Non-Medical): Patient declined  Physical Activity: Not on file  Stress: Not on file  Social Connections: Not on file    Allergies:  Allergies  Allergen Reactions   Sulfamethoxazole-Trimethoprim     Metabolic Disorder Labs: Lab Results  Component Value Date   HGBA1C 6.6 (H) 04/05/2021   MPG 143 04/05/2021   No results found for: "PROLACTIN" No results found for: "CHOL", "TRIG", "HDL", "CHOLHDL", "VLDL", "LDLCALC" Lab Results  Component Value Date   TSH 0.952 04/04/2021    Therapeutic Level Labs: No results found for: "LITHIUM" No results found for: "VALPROATE" No results found for: "CBMZ"  Current Medications: Current Outpatient Medications  Medication Sig Dispense Refill   aspirin 81 MG chewable tablet Chew 81 mg by mouth daily.     atorvastatin (LIPITOR) 40 MG tablet Take 40 mg by mouth daily.      buPROPion (WELLBUTRIN XL) 300 MG 24 hr tablet Take 300 mg by mouth daily.     cetirizine (ZYRTEC) 10 MG tablet Take 10 mg by mouth daily.     Coenzyme Q10 100 MG capsule Take 100 mg by mouth daily.     empagliflozin (JARDIANCE) 25 MG TABS tablet Take 25 mg by mouth daily.     gabapentin (NEURONTIN) 300 MG capsule Take by mouth. TAKE TWO CAPSULES BY MOUTH IN THE MORNING AND TAKE ONE CAPSULE AT BEDTIME FOR NEUROPATHIC PAIN     ketoconazole (NIZORAL) 2 % shampoo Apply topically.     lisinopril (ZESTRIL) 10 MG tablet Take 1 tablet by mouth daily.     metFORMIN (GLUCOPHAGE) 500 MG tablet Take 500 mg by mouth 2 (two) times daily with a meal.     metoprolol succinate (TOPROL-XL) 25 MG 24 hr tablet Take 25 mg by mouth daily.     Multiple Vitamins-Minerals (MULTIVITAMIN WITH MINERALS) tablet Take 1 tablet by mouth daily.     sertraline (ZOLOFT) 100 MG tablet Take 1 tablet (100 mg total) by mouth at bedtime. 90 tablet 0   Vibegron (GEMTESA) 75 MG TABS Take 1 tablet (75 mg total) by mouth daily.     No current facility-administered medications for this visit.     Musculoskeletal: Strength & Muscle Tone: within normal limits Gait & Station: normal Patient leans: N/A  Psychiatric Specialty Exam: Review of Systems  There were no vitals taken for this visit.There is no height or weight on file to calculate BMI.  General Appearance: {Appearance:22683}  Eye Contact:  {BHH EYE CONTACT:22684}  Speech:  Clear and Coherent  Volume:  Normal  Mood:  {BHH MOOD:22306}  Affect:  {Affect (PAA):22687}  Thought Process:  Coherent  Orientation:  Full (Time, Place, and Person)  Thought Content: Logical   Suicidal Thoughts:  {ST/HT (PAA):22692}  Homicidal Thoughts:  {ST/HT (PAA):22692}  Memory:  Immediate;   Good  Judgement:  {Judgement (PAA):22694}  Insight:  {Insight (PAA):22695}  Psychomotor Activity:  Normal  Concentration:  Concentration: Good and Attention Span: Good  Recall:  Good  Fund of  Knowledge: Good  Language: Good  Akathisia:  No  Handed:  Right  AIMS (if indicated): not done  Assets:  Communication Skills Desire for Improvement  ADL's:  Intact  Cognition: WNL  Sleep:  {BHH GOOD/FAIR/POOR:22877}   Screenings: GAD-7    Loss adjuster, chartered Office Visit from 01/21/2023 in [facility] Office Visit from 12/02/2022 in [facility] Office Visit from 04/18/2022 in [facility] Counselor from 11/08/2021 in [facility] at [facility]  Total GAD-7 Score 2  3 2 4       PHQ2-9    Flowsheet Row Office Visit from 01/21/2023 in [facility] Office Visit from 12/02/2022 in [facility] Office Visit from 04/18/2022 in [facility] Office Visit from 02/19/2022 in [facility] Counselor from 11/08/2021 in [facility] Outpatient Behavior[facility] Health at [facility] Total Score 3 1 2 1  0  PHQ-9 Total Score 6 -- 5 7 --      Flowsheet Row Office Visit from 01/21/2023 in [facility] Office Visit from 12/02/2022 in [facility] Counselor from 11/08/2021 in [facility] Outpatient Behavior[facility] at [facility] RISK CATEGORY No Risk No Risk No Risk        Assessment and Plan:  Lawson Mahone Amis is a 78 y.o. year old male with a history of  ITP, splenomegaly, monoclonal lymphocytosis, pulmonary embolism, CAD s/p coronary stents, diabetes with neuropathy, hyperlipidemia, hypertension,  , who is referred for depression.    1. MDD (major depressive disorder), recurrent episode, mild (HCC) 2. Anxiety disorder, unspecified type # r/o PTSD Acute stressors include: loss of his brother in law in [place]  Other  stressors include: deployment to [place]/combat, loss of a lady a few years ago    History:  dx depression when he was in [place], years ago, was on fluoxetine in the past. Originally on bupropion 150 mg daily (newly started)  He reports slight worsening in depressive symptoms during holiday season, which he attributes to being unable to leave the house due to his physical issues.  He does have PTSD symptoms including hypervigilance and avoidance, although there has been improvement in dream. Will uptitrate sertraline to optimize treatment for depression and anxiety.  Will continue bupropion adjunctive treatment for depression.  Although he did try CPT through [place], he reports limited benefit.  PA does not cover for insurance outside of [place].   3. Alcohol use disorder in remission - abstinent since March 7th ,2017 except social drink   He has sporadic alcohol use, and denies any craving for alcohol.  Provided psychoeducation about possible risk of relapse.  Will continue motivational interview.    # Insomnia - reportedly had sleep test in 2023 at [facility]. Did not hear the result. He is not interested in CPAP machine.  He reports middle insomnia, which he only attributes to nocturia.  Provided psycho education about sleep hygiene.    Plan  Continue bupropion 150 mg daily (300 mg caused him feeling uncomfortable)- he declined a refill Increase sertraline 100 mg at night Next appointment: 3/20 at 10 AM, IP - on gabapentin 300 mg am, 600 mg at night  - on cetrizine for itchiness   The patient demonstrates the following risk factors for suicide: Chronic risk factors for suicide include: psychiatric disorder of depression . Acute risk factors for suicide include: unemployment. Protective factors for this patient include: coping skills and hope for the future. Considering these factors, the overall suicide risk at this point appears to be low. Patient is appropriate for outpatient follow up.    Past trials of  medication: fluoxetine, bupropion, trazodone (lethargic), Ambien, melatonin, Xanax   Collaboration of Care: Collaboration of Care: {BH OP Collaboration of Care:21014065}  Patient/Guardian was advised Release of Information must be obtained prior to any record release in order to collaborate their care with an outside provider. Patient/Guardian was advised if they  have not already done so to contact the registration department to sign all necessary forms in order for [place] to release information regarding their care.   Consent: Patient/Guardian gives verbal consent for treatment and assignment of benefits for services provided during this visit. Patient/Guardian expressed understanding and agreed to proceed.    Neysa Hotter, MD 05/18/2023, 10:27 AM

## 2023-05-22 ENCOUNTER — Ambulatory Visit: Payer: Self-pay | Admitting: Psychiatry

## 2023-05-27 ENCOUNTER — Other Ambulatory Visit: Payer: Self-pay

## 2023-05-27 DIAGNOSIS — I2510 Atherosclerotic heart disease of native coronary artery without angina pectoris: Secondary | ICD-10-CM | POA: Diagnosis not present

## 2023-05-27 DIAGNOSIS — E785 Hyperlipidemia, unspecified: Secondary | ICD-10-CM | POA: Diagnosis not present

## 2023-05-27 DIAGNOSIS — N3281 Overactive bladder: Secondary | ICD-10-CM

## 2023-05-27 DIAGNOSIS — I214 Non-ST elevation (NSTEMI) myocardial infarction: Secondary | ICD-10-CM | POA: Diagnosis not present

## 2023-05-27 MED ORDER — GEMTESA 75 MG PO TABS: 75.0000 mg | ORAL_TABLET | Freq: Every day | ORAL | 3 refills | Status: AC

## 2023-05-27 NOTE — Telephone Encounter (Signed)
 Patient states Gemtesa samples were very helpful. He ran out of them at the end of last month and noa realizes how well that medication was helping his symptoms as his urgency, frequency is intense again. RX sent in and patient was advised that this may be expensive and to let us know if he is not able to get this filled due to a cost.

## 2023-06-03 NOTE — Progress Notes (Signed)
 BH MD/PA/NP OP Progress Note  06/09/2023 3:44 PM Eric Joyce  MRN:  [id number]  Chief Complaint:  Chief Complaint  Patient presents with   Follow-up   HPI:  This is a follow-up appointment for depression, anxiety.  He states that he now has a 11-month-old Event organiser.  He was looking for a pet, and has found the one on the street.  He thinks he is getting better care compared to before.  He also enjoys going to [place].  He saw his sister, and his daughter, who came from [place].  He goes to gym twice a week.  He does not see a trainer due to concern about the cost.  He thinks his mood has been even.  Although it is difficult to tell if higher dose of sertraline has helped, he was not in a good place a few months ago due to sickness.  He denies feeling depressed.  He denies anxiety.  He sleeps well except the time Diessel, his dog comes on the bed.  He denies change in appetite.  He denies SI.  When he was asked about flashbacks, he states that he tries not to think about it so that it does not get to him.  He states that the past his history, and the future is mystery.  He tries to focus at the present moment.  He agrees with the plans as outlined below.    Wt Readings from Last 3 Encounters:  06/09/23 216 lb 6.4 oz (98.2 kg)  04/03/23 215 lb 6.4 oz (97.7 kg)  03/18/23 222 lb 9.6 oz (101 kg)     Substance use   Tobacco Alcohol Other substances/  Current   Few drinks on his birthday, drinks once a month, denies cravings denies  Past   Drink "heavily", last in 05/09/2014 denies  Past Treatment   (Did not find AA to be helpful)        Household: by himself Marital status:never married Number of children: 19 yo daughter (lives in [place]. His mother is [place]) Employment: retired in 2016, works at [employer]. 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 Education:   He is originally from [place]. He lived in [place] for 11 years because of his daughter. He had a "platonic" relationship with  the mother of his daughter, who is [place].  Visit Diagnosis:    ICD-10-CM   1. MDD (major depressive disorder), recurrent episode, mild (HCC)  F33.0     2. Anxiety disorder, unspecified type  F41.9       Past Psychiatric History: Please see initial evaluation for full details. I have reviewed the history. No updates at this time.     Past Medical History:  Past Medical History:  Diagnosis Date   Diabetes mellitus without complication (HCC)    Hyperlipidemia    Hypertension    Myocardial infarction [facility])     Past Surgical History:  Procedure Laterality Date   APPENDECTOMY     CARDIAC SURGERY     2 stents   EYE SURGERY     REPLACEMENT TOTAL KNEE Right    TONSILLECTOMY AND ADENOIDECTOMY      Family Psychiatric History: Please see initial evaluation for full details. I have reviewed the history. No updates at this time.     Family History:  Family History  Problem Relation Age of Onset   Lung cancer Mother    Heart disease Father    Diabetes Paternal Uncle    Diabetes Paternal Grandmother  Social History:  Social History   Socioeconomic History   Marital status: Single    Spouse name: Not on file   Number of children: Not on file   Years of education: Not on file   Highest education level: Not on file  Occupational History   Not on file  Tobacco Use   Smoking status: Former    Types: Cigarettes    Passive exposure: Past   Smokeless tobacco: Never  Vaping Use   Vaping status: Some Days  Substance and Sexual Activity   Alcohol use: Not Currently   Drug use: Yes    Types: Marijuana   Sexual activity: Yes  Other Topics Concern   Not on file  Social History Narrative   Not on file   Social Drivers of Health   Financial Resource Strain: Low Risk  (01/27/2023)   Received from [facility]   Overall Financial Resource Strain (CARDIA)    Difficulty of Paying Living Expenses: Not hard at all  Food Insecurity: No Food Insecurity  (01/27/2023)   Received from [facility]   Hunger Vital Sign    Worried About Running Out of Food in the Last Year: Never true    Ran Out of Food in the Last Year: Never true  Transportation Needs: Unknown (01/27/2023)   Received from [facility]   PRAPARE - Transportation    In the past 12 months, has lack of transportation kept you from medical appointments or from getting medications?: No    Lack of Transportation (Non-Medical): Patient declined  Physical Activity: Not on file  Stress: Not on file  Social Connections: Not on file    Allergies:  Allergies  Allergen Reactions   Sulfamethoxazole-Trimethoprim     Metabolic Disorder Labs: Lab Results  Component Value Date   HGBA1C 6.6 (H) 04/05/2021   MPG 143 04/05/2021   No results found for: "PROLACTIN" No results found for: "CHOL", "TRIG", "HDL", "CHOLHDL", "VLDL", "LDLCALC" Lab Results  Component Value Date   TSH 0.952 04/04/2021    Therapeutic Level Labs: No results found for: "LITHIUM" No results found for: "VALPROATE" No results found for: "CBMZ"  Current Medications: Current Outpatient Medications  Medication Sig Dispense Refill   aspirin 81 MG chewable tablet Chew 81 mg by mouth daily.     atorvastatin (LIPITOR) 40 MG tablet Take 40 mg by mouth daily.     buPROPion (WELLBUTRIN XL) 150 MG 24 hr tablet Take 150 mg by mouth daily.     cetirizine (ZYRTEC) 10 MG tablet Take 10 mg by mouth daily.     Coenzyme Q10 100 MG capsule Take 100 mg by mouth daily.     empagliflozin (JARDIANCE) 25 MG TABS tablet Take 25 mg by mouth daily.     gabapentin (NEURONTIN) 300 MG capsule Take by mouth. TAKE TWO CAPSULES BY MOUTH IN THE MORNING AND TAKE ONE CAPSULE AT BEDTIME FOR NEUROPATHIC PAIN     ketoconazole (NIZORAL) 2 % shampoo Apply topically.     lisinopril (ZESTRIL) 10 MG tablet Take 1 tablet by mouth daily.     metFORMIN (GLUCOPHAGE) 500 MG tablet Take 500 mg by mouth 2 (two) times daily  with a meal.     metoprolol succinate (TOPROL-XL) 25 MG 24 hr tablet Take 25 mg by mouth daily.     Multiple Vitamins-Minerals (MULTIVITAMIN WITH MINERALS) tablet Take 1 tablet by mouth daily.     Vibegron (GEMTESA) 75 MG TABS Take 1 tablet (75  mg total) by mouth daily. 90 tablet 3   [START ON 06/16/2023] sertraline (ZOLOFT) 100 MG tablet Take 1 tablet (100 mg total) by mouth at bedtime. 90 tablet 0   No current facility-administered medications for this visit.     Musculoskeletal: Strength & Muscle Tone: within normal limits Gait & Station: normal Patient leans: N/A  Psychiatric Specialty Exam: Review of Systems  Psychiatric/Behavioral:  Positive for sleep disturbance. Negative for agitation, behavioral problems, confusion, decreased concentration, dysphoric mood, hallucinations, self-injury and suicidal ideas. The patient is not nervous/anxious and is not hyperactive.   All other systems reviewed and are negative.   Blood pressure 136/72, pulse 61, temperature (!) 97.1 F (36.2 C), temperature source Temporal, height 6' (1.829 m), weight 216 lb 6.4 oz (98.2 kg).Body mass index is 29.35 kg/m.  General Appearance: Well Groomed  Eye Contact:  Good  Speech:  Clear and Coherent  Volume:  Normal  Mood:   good  Affect:  Appropriate, Congruent, and Full Range  Thought Process:  Coherent  Orientation:  Full (Time, Place, and Person)  Thought Content: Logical   Suicidal Thoughts:  No  Homicidal Thoughts:  No  Memory:  Immediate;   Good  Judgement:  Good  Insight:  Good  Psychomotor Activity:  Normal  Concentration:  Concentration: Good and Attention Span: Good  Recall:  Good  Fund of Knowledge: Good  Language: Good  Akathisia:  No  Handed:  Right  AIMS (if indicated): not done  Assets:  Communication Skills Desire for Improvement  ADL's:  Intact  Cognition: WNL  Sleep:  Fair   Screenings: GAD-7    Flowsheet Row Office Visit from 01/21/2023 in [facility] Office Visit from 12/02/2022 in [facility] Office Visit from 04/18/2022 in [facility] Associates Counselor from 11/08/2021 in [facility] at [facility]  Total GAD-7 Score 2 3 2 4       PHQ2-9    Flowsheet Row Office Visit from 01/21/2023 in [facility] Office Visit from 12/02/2022 in [facility] Office Visit from 04/18/2022 in [facility] Office Visit from 02/19/2022 in [facility] Associates Counselor from 11/08/2021 in [facility] Health at [facility] Total Score 3 1 2 1  0  PHQ-9 Total Score 6 -- 5 7 --      Flowsheet Row Office Visit from 01/21/2023 in [facility] Associates Office Visit from 12/02/2022 in [facility] Associates Counselor from 11/08/2021 in [facility] Outpatient Behavioral Health at [facility] RISK CATEGORY No Risk No Risk No Risk        Assessment and Plan:  Eric Joyce is a 78 y.o. year old male with a history of  ITP, splenomegaly, monoclonal lymphocytosis, pulmonary embolism, CAD s/p coronary stents, diabetes with neuropathy, hyperlipidemia, hypertension,  , who is referred for depression.   1. MDD (major depressive disorder), recurrent episode, mild (HCC) 2. Anxiety disorder, unspecified type # r/o PTSD Acute stressors include: loss of his brother in law in [place]  Other stressors include: deployment to [place]/combat, loss of a lady a few years ago    History:  dx depression when he was in [place], years ago, was on fluoxetine in the past. Originally on bupropion 150 mg daily (newly started) . Although he did try CPT through [place], he reports  limited benefit.  PA does not cover for insurance outside of  [place]. He reports improvement in depressive symptoms and anxiety, which coincided with starting to have a dog, and uptitration of sertraline.  Noted that he also reports improvement in dream and hypervigilance, while he has avoidance.  Will continue current dose of sertraline to target depression, anxiety, and some PTSD symptoms.  Will continue bupropion as adjunctive treatment for depression.    3. Alcohol use disorder in remission - abstinent since March 7th ,2017 except social drink   He has sporadic alcohol use, and denies any craving for alcohol.  Provided psychoeducation about possible risk of relapse.  Will continue motivational interview.    # Insomnia - reportedly had sleep test in 2023 at [facility]. Did not hear the result. He is not interested in CPAP machine.  He reports middle insomnia, which he only attributes to nocturia.  Provided psycho education about sleep hygiene.    Plan  Continue bupropion 150 mg daily (300 mg caused him feeling uncomfortable)- he declined a refill Continue sertraline 100 mg at night Next appointment: 5/27 at 11 am, IP - on gabapentin 300 mg am, 600 mg at night  - on cetrizine for itchiness - on jardiance   The patient demonstrates the following risk factors for suicide: Chronic risk factors for suicide include: psychiatric disorder of depression . Acute risk factors for suicide include: unemployment. Protective factors for this patient include: coping skills and hope for the future. Considering these factors, the overall suicide risk at this point appears to be low. Patient is appropriate for outpatient follow up.    Past trials of medication: fluoxetine, bupropion, trazodone (lethargic), Ambien, melatonin, Xanax   Collaboration of Care: Collaboration of Care: Other reviewed notes in Epic  Patient/Guardian was advised Release of Information must be obtained prior to any record release in order to collaborate their care with an outside provider. Patient/Guardian  was advised if they have not already done so to contact the registration department to sign all necessary forms in order for [place] to release information regarding their care.   Consent: Patient/Guardian gives verbal consent for treatment and assignment of benefits for services provided during this visit. Patient/Guardian expressed understanding and agreed to proceed.    Neysa Hotter, MD 06/09/2023, 3:44 PM

## 2023-06-09 ENCOUNTER — Ambulatory Visit (INDEPENDENT_AMBULATORY_CARE_PROVIDER_SITE_OTHER): Admitting: Psychiatry

## 2023-06-09 ENCOUNTER — Encounter: Payer: Self-pay | Admitting: Psychiatry

## 2023-06-09 ENCOUNTER — Other Ambulatory Visit: Payer: Self-pay

## 2023-06-09 VITALS — BP 136/72 | HR 61 | Temp 97.1°F | Ht 72.0 in | Wt 216.4 lb

## 2023-06-09 DIAGNOSIS — F330 Major depressive disorder, recurrent, mild: Secondary | ICD-10-CM | POA: Diagnosis not present

## 2023-06-09 DIAGNOSIS — F419 Anxiety disorder, unspecified: Secondary | ICD-10-CM

## 2023-06-09 MED ORDER — SERTRALINE HCL 100 MG PO TABS
100.0000 mg | ORAL_TABLET | Freq: Every day | ORAL | 0 refills | Status: DC
Start: 2023-06-16 — End: 2023-08-26

## 2023-06-09 NOTE — Patient Instructions (Signed)
 Continue bupropion 150 mg daily  Continue sertraline 100 mg at night Next appointment: 5/27 at 11 am

## 2023-07-21 NOTE — Progress Notes (Unsigned)
 [facility] MD/PA/NP OP Progress Note  07/29/2023 2:00 PM Eric Joyce Gettinger  MRN:  [id number]  Chief Complaint:  Chief Complaint  Patient presents with   Follow-up   HPI:  This is a follow-up appointment for depression and anxiety.  He talks about Diesel, rescued dog, who worn him out. He thinks he is clingy.  However, he does not regret getting a pet, and he believes it will work out.  He thought about so many people who lost their lives Memorial Day.  He visited ED for treatment for callous. He saw people in a wheelchair, and people who had amputation.  It reminds him of the past.  However, he states that "this is what it is."  Although he can dwell on things, he tries not to.  He thinks Diesel make him moving forward.  He is hoping to see his daughter in [place].  However, he is also trying to see his sister, who lost her husband last year.  Although he has some days he feels off, "melancholic," he denies feeling depressed.  He has been drinking a little more on weekends since he has Diesel.  He has insomnia due to him.  Although he has some weird dreams, he does not feel familiar with this, and denies any nightmares otherwise.  He denies SI.  He agrees with the plans as outlined below.   Wt Readings from Last 3 Encounters:  07/29/23 217 lb 9.6 oz (98.7 kg)  06/09/23 216 lb 6.4 oz (98.2 kg)  04/03/23 215 lb 6.4 oz (97.7 kg)      Substance use   Tobacco Alcohol  Other substances/  Current   2-4 cocktails on his birthday, weekend, drinks once a month, denies cravings denies  Past   Drink "heavily", last in 05/09/2014 denies  Past Treatment   (Did not find AA to be helpful)        Household: by himself Marital status:never married Number of children: 8 yo daughter (lives in [place]. His mother is [place]) Employment: retired in 2016, works at [employer]. 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 Education:   He is originally from [place], [place]. He lived in [place] for 11 years because of his  daughter. He had a "platonic" relationship with the mother of his daughter, who is [place].  Visit Diagnosis:    ICD-10-CM   1. MDD (major depressive disorder), recurrent, in partial remission (HCC)  F33.41     2. Anxiety disorder, unspecified type  F41.9     3. Alcohol  use disorder  F10.90     4. PTSD (post-traumatic stress disorder)  F43.10     5. High risk medication use  Z79.899 Comprehensive metabolic panel with GFR      Past Psychiatric History: Please see initial evaluation for full details. I have reviewed the history. No updates at this time.     Past Medical History:  Past Medical History:  Diagnosis Date   Diabetes mellitus without complication (HCC)    Hyperlipidemia    Hypertension    Myocardial infarction [facility])     Past Surgical History:  Procedure Laterality Date   APPENDECTOMY     CARDIAC SURGERY     2 stents   EYE SURGERY     REPLACEMENT TOTAL KNEE Right    TONSILLECTOMY AND ADENOIDECTOMY      Family Psychiatric History: Please see initial evaluation for full details. I have reviewed the history. No updates at this time.     Family History:  Family  History  Problem Relation Age of Onset   Lung cancer Mother    Heart disease Father    Diabetes Paternal Uncle    Diabetes Paternal Grandmother     Social History:  Social History   Socioeconomic History   Marital status: Single    Spouse name: Not on file   Number of children: Not on file   Years of education: Not on file   Highest education level: Not on file  Occupational History   Not on file  Tobacco Use   Smoking status: Former    Types: Cigarettes    Passive exposure: Past   Smokeless tobacco: Never  Vaping Use   Vaping status: Some Days  Substance and Sexual Activity   Alcohol  use: Not Currently   Drug use: Yes    Types: Marijuana   Sexual activity: Yes  Other Topics Concern   Not on file  Social History Narrative   Not on file   Social Drivers of Health   Financial  Resource Strain: Low Risk  (01/27/2023)   Received from [facility]   Overall Financial Resource Strain (CARDIA)    Difficulty of Paying Living Expenses: Not hard at all  Food Insecurity: No Food Insecurity (01/27/2023)   Received from [facility]   Hunger Vital Sign    Worried About Running Out of Food in the Last Year: Never true    Ran Out of Food in the Last Year: Never true  Transportation Needs: Unknown (01/27/2023)   Received from [facility]   PRAPARE - Transportation    In the past 12 months, has lack of transportation kept you from medical appointments or from getting medications?: No    Lack of Transportation (Non-Medical): Patient declined  Physical Activity: Not on file  Stress: Not on file  Social Connections: Not on file    Allergies:  Allergies  Allergen Reactions   Sulfamethoxazole-Trimethoprim     Metabolic Disorder Labs: Lab Results  Component Value Date   HGBA1C 6.6 (H) 04/05/2021   MPG 143 04/05/2021   No results found for: "PROLACTIN" No results found for: "CHOL", "TRIG", "HDL", "CHOLHDL", "VLDL", "LDLCALC" Lab Results  Component Value Date   TSH 0.952 04/04/2021    Therapeutic Level Labs: No results found for: "LITHIUM" No results found for: "VALPROATE" No results found for: "CBMZ"  Current Medications: Current Outpatient Medications  Medication Sig Dispense Refill   aspirin  81 MG chewable tablet Chew 81 mg by mouth daily.     atorvastatin (LIPITOR) 40 MG tablet Take 40 mg by mouth daily.     buPROPion  (WELLBUTRIN  XL) 150 MG 24 hr tablet Take 150 mg by mouth daily.     cetirizine (ZYRTEC) 10 MG tablet Take 10 mg by mouth daily.     Coenzyme Q10 100 MG capsule Take 100 mg by mouth daily.     empagliflozin (JARDIANCE) 25 MG TABS tablet Take 25 mg by mouth daily.     gabapentin  (NEURONTIN ) 300 MG capsule Take by mouth. TAKE TWO CAPSULES BY MOUTH IN THE MORNING AND TAKE ONE CAPSULE AT BEDTIME  FOR NEUROPATHIC PAIN     ketoconazole (NIZORAL) 2 % shampoo Apply topically.     lisinopril (ZESTRIL) 10 MG tablet Take 1 tablet by mouth daily.     metFORMIN (GLUCOPHAGE) 500 MG tablet Take 500 mg by mouth 2 (two) times daily with a meal.     metoprolol  succinate (TOPROL -XL) 25 MG 24 hr tablet Take  25 mg by mouth daily.     Multiple Vitamins-Minerals (MULTIVITAMIN WITH MINERALS) tablet Take 1 tablet by mouth daily.     sertraline  (ZOLOFT ) 100 MG tablet Take 1 tablet (100 mg total) by mouth at bedtime. 90 tablet 0   Vibegron  (GEMTESA ) 75 MG TABS Take 1 tablet (75 mg total) by mouth daily. 90 tablet 3   No current facility-administered medications for this visit.     Musculoskeletal: Strength & Muscle Tone: Normal Gait & Station: Normal Patient leans: N/A  Psychiatric Specialty Exam: Review of Systems  Psychiatric/Behavioral:  Positive for sleep disturbance. Negative for agitation, behavioral problems, confusion, decreased concentration, dysphoric mood, hallucinations, self-injury and suicidal ideas. The patient is not nervous/anxious and is not hyperactive.   All other systems reviewed and are negative.   Blood pressure 137/73, pulse 60, temperature (!) 97.3 F (36.3 C), temperature source Temporal, height 6' (1.829 m), weight 217 lb 9.6 oz (98.7 kg).Body mass index is 29.51 kg/m.  General Appearance: Well Groomed  Eye Contact:  Good  Speech:  Clear and Coherent  Volume:  Normal  Mood:  good  Affect:  Appropriate, Congruent, and Full Range  Thought Process:  Coherent  Orientation:  Full (Time, Place, and Person)  Thought Content: Logical   Suicidal Thoughts:  No  Homicidal Thoughts:  No  Memory:  Immediate;   Good  Judgement:  Good  Insight:  Good  Psychomotor Activity:  Normal  Concentration:  Concentration: Good and Attention Span: Good  Recall:  Good  Fund of Knowledge: Good  Language: Good  Akathisia:  No  Handed:  Right  AIMS (if indicated): not done  Assets:   Communication Skills Desire for Improvement  ADL's:  Intact  Cognition: WNL  Sleep:  Fair   Screenings: GAD-7    Flowsheet Row Office Visit from 01/21/2023 in [facility] Office Visit from 12/02/2022 in [facility] Office Visit from 04/18/2022 in [facility] Counselor from 11/08/2021 in [facility] at [facility]  Total GAD-7 Score 2 3 2 4       PHQ2-9    Flowsheet Row Office Visit from 01/21/2023 in [facility] Office Visit from 12/02/2022 in [facility] Office Visit from 04/18/2022 in [facility] Office Visit from 02/19/2022 in [facility] Counselor from 11/08/2021 in [facility] at [facility] Total Score 3 1 2 1  0  PHQ-9 Total Score 6 -- 5 7 --      Flowsheet Row Office Visit from 01/21/2023 in [facility] Office Visit from 12/02/2022 in [facility] Counselor from 11/08/2021 in [facility] at [facility] RISK CATEGORY No Risk No Risk No Risk        Assessment and Plan:  Jodeci Roarty Wigle is a 78 y.o. year old male with a history of  ITP, splenomegaly, monoclonal lymphocytosis, pulmonary embolism, CAD s/p coronary stents, diabetes with neuropathy, hyperlipidemia, hypertension,  , who is referred for depression.   1. MDD (major depressive disorder), recurrent, in partial remission (HCC) 2. Anxiety disorder, unspecified type # PTSD Acute stressors include: loss of his brother in law in [place]   Other stressors include: deployment to [place]/combat, loss of a lady a few years ago    History:  dx depression when he was  in [place], years  ago, was on fluoxetine in the past. Originally on bupropion  150 mg daily (newly started) . Although he did try CPT through [place], he reports limited benefit.  PA does not cover for insurance outside of [place].   There has been overall improvement in depressive symptoms and anxiety since uptitration of sertraline .  Although he continues to have avoidance, occasional flashback, these symptoms has been manageable.  Will continue current dose of sertraline  to target depression, PTSD, anxiety along with bupropion  adjunctive treatment for depression.   3. Alcohol  use disorder He reports concern of relapse in alcohol  use on weekends, although he denies craving for alcohol .  Will plan to start naltrexone after reviewing labs.  Discussed potential risk of LFT abnormality.   # Insomnia - reportedly had sleep test in 2023 at [facility]. Did not hear the result. He is not interested in CPAP machine.  He reports middle insomnia, which he only attributes to nocturia.  Provided psycho education about sleep hygiene.    Plan  Continue bupropion  150 mg daily (300 mg caused him feeling uncomfortable)- he declined a refill Continue sertraline  100 mg at night Please send a result of labs (CMP, including AST/ALT/Alk Phos) - will also send a order to [facility] After reviewing labs, start naltrexone 25 mg at night  Next appointment: 6/24 at 10 am, video - on gabapentin  300 mg am, 600 mg at night  - on cetrizine for itchiness - on jardiance   The patient demonstrates the following risk factors for suicide: Chronic risk factors for suicide include: psychiatric disorder of depression . Acute risk factors for suicide include: unemployment. Protective factors for this patient include: coping skills and hope for the future. Considering these factors, the overall suicide risk at this point appears to be low. Patient is appropriate for outpatient follow up.    Past trials of medication: fluoxetine, bupropion , trazodone (lethargic), Ambien,  melatonin, Xanax      Collaboration of Care: Collaboration of Care: Other reviewed notes in Epic  Patient/Guardian was advised Release of Information must be obtained prior to any record release in order to collaborate their care with an outside provider. Patient/Guardian was advised if they have not already done so to contact the registration department to sign all necessary forms in order for us  to release information regarding their care.   Consent: Patient/Guardian gives verbal consent for treatment and assignment of benefits for services provided during this visit. Patient/Guardian expressed understanding and agreed to proceed.    Todd Fossa, MD 07/29/2023, 2:00 PM

## 2023-07-29 ENCOUNTER — Other Ambulatory Visit: Payer: Self-pay

## 2023-07-29 ENCOUNTER — Encounter: Payer: Self-pay | Admitting: Psychiatry

## 2023-07-29 ENCOUNTER — Ambulatory Visit (INDEPENDENT_AMBULATORY_CARE_PROVIDER_SITE_OTHER): Admitting: Psychiatry

## 2023-07-29 VITALS — BP 137/73 | HR 60 | Temp 97.3°F | Ht 72.0 in | Wt 217.6 lb

## 2023-07-29 DIAGNOSIS — Z79899 Other long term (current) drug therapy: Secondary | ICD-10-CM | POA: Diagnosis not present

## 2023-07-29 DIAGNOSIS — F4310 Post-traumatic stress disorder, unspecified: Secondary | ICD-10-CM | POA: Diagnosis not present

## 2023-07-29 DIAGNOSIS — F419 Anxiety disorder, unspecified: Secondary | ICD-10-CM | POA: Diagnosis not present

## 2023-07-29 DIAGNOSIS — F3341 Major depressive disorder, recurrent, in partial remission: Secondary | ICD-10-CM | POA: Diagnosis not present

## 2023-07-29 DIAGNOSIS — F1090 Alcohol use, unspecified, uncomplicated: Secondary | ICD-10-CM

## 2023-07-29 NOTE — Patient Instructions (Signed)
 Continue bupropion  150 mg daily  Continue sertraline  100 mg at night Please send a result of labs (CMP, including AST/ALT/Alk Phos) After reviewing labs, start naltrexone 25 mg at night  Next appointment: 6/24 at 10 am

## 2023-07-31 ENCOUNTER — Telehealth: Payer: Self-pay

## 2023-07-31 NOTE — Telephone Encounter (Signed)
 Pt notified pt stated that he forgot that you told him to do that.

## 2023-07-31 NOTE — Telephone Encounter (Signed)
 pt states that the medication for etoh was not sent to the pharmacy.Natltcexone 25mg   pt was seen on 5-27 next appt 6-24

## 2023-07-31 NOTE — Telephone Encounter (Signed)
 Please remind him that the plan was for him to either send us  the lab results or have them completed at [facility]. I will plan to order naltrexone after reviewing the results, to ensure safety given the potential impact on liver function.

## 2023-08-04 DIAGNOSIS — L97521 Non-pressure chronic ulcer of other part of left foot limited to breakdown of skin: Secondary | ICD-10-CM | POA: Diagnosis not present

## 2023-08-04 DIAGNOSIS — L03116 Cellulitis of left lower limb: Secondary | ICD-10-CM | POA: Diagnosis not present

## 2023-08-04 DIAGNOSIS — E1142 Type 2 diabetes mellitus with diabetic polyneuropathy: Secondary | ICD-10-CM | POA: Diagnosis not present

## 2023-08-15 DIAGNOSIS — Z79899 Other long term (current) drug therapy: Secondary | ICD-10-CM | POA: Diagnosis not present

## 2023-08-16 ENCOUNTER — Ambulatory Visit: Payer: Self-pay | Admitting: Psychiatry

## 2023-08-16 LAB — COMPREHENSIVE METABOLIC PANEL WITH GFR
ALT: 31 IU/L (ref 0–44)
AST: 29 IU/L (ref 0–40)
Albumin: 4.5 g/dL (ref 3.8–4.8)
Alkaline Phosphatase: 132 IU/L — ABNORMAL HIGH (ref 44–121)
BUN/Creatinine Ratio: 14 (ref 10–24)
BUN: 13 mg/dL (ref 8–27)
Bilirubin Total: 0.4 mg/dL (ref 0.0–1.2)
CO2: 21 mmol/L (ref 20–29)
Calcium: 9.3 mg/dL (ref 8.6–10.2)
Chloride: 101 mmol/L (ref 96–106)
Creatinine, Ser: 0.91 mg/dL (ref 0.76–1.27)
Globulin, Total: 2.6 g/dL (ref 1.5–4.5)
Glucose: 117 mg/dL — ABNORMAL HIGH (ref 70–99)
Potassium: 4 mmol/L (ref 3.5–5.2)
Sodium: 140 mmol/L (ref 134–144)
Total Protein: 7.1 g/dL (ref 6.0–8.5)
eGFR: 87 mL/min/{1.73_m2} (ref >59)

## 2023-08-16 MED ORDER — NALTREXONE HCL 50 MG PO TABS: 25.0000 mg | ORAL_TABLET | Freq: Every day | ORAL | 0 refills | Status: DC

## 2023-08-16 NOTE — Progress Notes (Signed)
 Please inform the patient that the recent labs showed slightly elevated alkaline phosphatase and mildly elevated blood sugar (if fasting). Recommend follow-up with primary care for further evaluation. Also let the patient know that naltrexone has been prescribed as discussed for alcohol  use.

## 2023-08-18 NOTE — Telephone Encounter (Signed)
 pt returning call. pt was given the results and instructions. pt also wanted to know if his liver functions was on the labwork you had ordered.

## 2023-08-18 NOTE — Telephone Encounter (Signed)
-----   Message from [place] sent at 08/16/2023  3:54 PM EDT ----- Please inform the patient that the recent labs showed slightly elevated alkaline phosphatase and mildly elevated blood sugar (if fasting). Recommend follow-up with primary care for further  evaluation. Also let the patient know that naltrexone has been prescribed as discussed for alcohol  use. ----- Message ----- From: Interface, [facility] Lab Results In Sent: 08/16/2023   5:37 AM EDT To: Todd Fossa, MD

## 2023-08-18 NOTE — Progress Notes (Signed)
 Yes, I specifically ordered AST and ALT as part of the assessment of her liver function.

## 2023-08-21 NOTE — Progress Notes (Unsigned)
 BH MD/PA/NP OP Progress Note  08/21/2023 8:49 AM Eric Joyce  MRN:  [id number]  Chief Complaint: No chief complaint on file.  HPI: ***  Alp elevation   Substance use   Tobacco Alcohol  Other substances/  Current   2-4 cocktails on his birthday, weekend, drinks once a month, denies cravings denies  Past   Drink heavily, last in 05/09/2014 denies  Past Treatment   (Did not find AA to be helpful)        Household: by himself Marital status:never married Number of children: 37 yo daughter (lives in [place]. His mother is [place]) Employment: retired in 2016, works at [employer]. 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 Education:   He is originally from [place], [place]. He lived in [place] for 11 years because of his daughter. He had a platonic relationship with the mother of his daughter, who is [place].    Visit Diagnosis: No diagnosis found.  Past Psychiatric History: Please see initial evaluation for full details. I have reviewed the history. No updates at this time.     Past Medical History:  Past Medical History:  Diagnosis Date   Diabetes mellitus without complication (HCC)    Hyperlipidemia    Hypertension    Myocardial infarction [facility])     Past Surgical History:  Procedure Laterality Date   APPENDECTOMY     CARDIAC SURGERY     2 stents   EYE SURGERY     REPLACEMENT TOTAL KNEE Right    TONSILLECTOMY AND ADENOIDECTOMY      Family Psychiatric History: Please see initial evaluation for full details. I have reviewed the history. No updates at this time.     Family History:  Family History  Problem Relation Age of Onset   Lung cancer Mother    Heart disease Father    Diabetes Paternal Uncle    Diabetes Paternal Grandmother     Social History:  Social History   Socioeconomic History   Marital status: Single    Spouse name: Not on file   Number of children: Not on file   Years of education: Not on file   Highest education level: Not on file   Occupational History   Not on file  Tobacco Use   Smoking status: Former    Types: Cigarettes    Passive exposure: Past   Smokeless tobacco: Never  Vaping Use   Vaping status: Some Days  Substance and Sexual Activity   Alcohol  use: Not Currently   Drug use: Yes    Types: Marijuana   Sexual activity: Yes  Other Topics Concern   Not on file  Social History Narrative   Not on file   Social Drivers of Health   Financial Resource Strain: Low Risk  (01/27/2023)   Received from [facility]   Overall Financial Resource Strain (CARDIA)    Difficulty of Paying Living Expenses: Not hard at all  Food Insecurity: No Food Insecurity (01/27/2023)   Received from [facility]   Hunger Vital Sign    Within the past 12 months, you worried that your food would run out before you got the money to buy more.: Never true    Within the past 12 months, the food you bought just didn't last and you didn't have money to get more.: Never true  Transportation Needs: Unknown (01/27/2023)   [facility] - Transportation    In the past 12 months, has  lack of transportation kept you from medical appointments or from getting medications?: No    Lack of Transportation (Non-Medical): Patient declined  Physical Activity: Not on file  Stress: Not on file  Social Connections: Not on file    Allergies:  Allergies  Allergen Reactions   Sulfamethoxazole-Trimethoprim     Metabolic Disorder Labs: Lab Results  Component Value Date   HGBA1C 6.6 (H) 04/05/2021   MPG 143 04/05/2021   No results found for: PROLACTIN No results found for: CHOL, TRIG, HDL, CHOLHDL, VLDL, LDLCALC Lab Results  Component Value Date   TSH 0.952 04/04/2021    Therapeutic Level Labs: No results found for: LITHIUM No results found for: VALPROATE No results found for: CBMZ  Current Medications: Current Outpatient Medications   Medication Sig Dispense Refill   aspirin  81 MG chewable tablet Chew 81 mg by mouth daily.     atorvastatin (LIPITOR) 40 MG tablet Take 40 mg by mouth daily.     buPROPion  (WELLBUTRIN  XL) 150 MG 24 hr tablet Take 150 mg by mouth daily.     cetirizine (ZYRTEC) 10 MG tablet Take 10 mg by mouth daily.     Coenzyme Q10 100 MG capsule Take 100 mg by mouth daily.     empagliflozin (JARDIANCE) 25 MG TABS tablet Take 25 mg by mouth daily.     gabapentin  (NEURONTIN ) 300 MG capsule Take by mouth. TAKE TWO CAPSULES BY MOUTH IN THE MORNING AND TAKE ONE CAPSULE AT BEDTIME FOR NEUROPATHIC PAIN     ketoconazole (NIZORAL) 2 % shampoo Apply topically.     lisinopril (ZESTRIL) 10 MG tablet Take 1 tablet by mouth daily.     metFORMIN (GLUCOPHAGE) 500 MG tablet Take 500 mg by mouth 2 (two) times daily with a meal.     metoprolol  succinate (TOPROL -XL) 25 MG 24 hr tablet Take 25 mg by mouth daily.     Multiple Vitamins-Minerals (MULTIVITAMIN WITH MINERALS) tablet Take 1 tablet by mouth daily.     naltrexone (DEPADE) 50 MG tablet Take 0.5 tablets (25 mg total) by mouth daily. 15 tablet 0   sertraline  (ZOLOFT ) 100 MG tablet Take 1 tablet (100 mg total) by mouth at bedtime. 90 tablet 0   Vibegron  (GEMTESA ) 75 MG TABS Take 1 tablet (75 mg total) by mouth daily. 90 tablet 3   No current facility-administered medications for this visit.     Musculoskeletal: Strength & Muscle Tone: N/A Gait & Station: N/A Patient leans: N/A  Psychiatric Specialty Exam: Review of Systems  There were no vitals taken for this visit.There is no height or weight on file to calculate BMI.  General Appearance: {Appearance:22683}  Eye Contact:  {BHH EYE CONTACT:22684}  Speech:  Clear and Coherent  Volume:  Normal  Mood:  {BHH MOOD:22306}  Affect:  {Affect (PAA):22687}  Thought Process:  Coherent  Orientation:  Full (Time, Place, and Person)  Thought Content: Logical   Suicidal Thoughts:  {ST/HT (PAA):22692}  Homicidal Thoughts:   {ST/HT (PAA):22692}  Memory:  Immediate;   Good  Judgement:  {Judgement (PAA):22694}  Insight:  {Insight (PAA):22695}  Psychomotor Activity:  Normal  Concentration:  Concentration: Good and Attention Span: Good  Recall:  Good  Fund of Knowledge: Good  Language: Good  Akathisia:  No  Handed:  Right  AIMS (if indicated): not done  Assets:  Communication Skills Desire for Improvement  ADL's:  Intact  Cognition: WNL  Sleep:  {BHH GOOD/FAIR/POOR:22877}   Screenings: GAD-7    Garment/textile technologist Visit  from 01/21/2023 in [facility] Office Visit from 12/02/2022 in [facility] Office Visit from 04/18/2022 in [facility] Counselor from 11/08/2021 in [facility] at [facility]  Total GAD-7 Score 2 3 2 4    PHQ2-9    Flowsheet Row Office Visit from 01/21/2023 in [facility] Office Visit from 12/02/2022 in [facility] Office Visit from 04/18/2022 in [facility] Office Visit from 02/19/2022 in [facility] Counselor from 11/08/2021 in [facility] at [facility] Total Score 3 1 2 1  0  PHQ-9 Total Score 6 -- 5 7 --   Flowsheet Row Office Visit from 01/21/2023 in [facility] Office Visit from 12/02/2022 in [facility] Counselor from 11/08/2021 in [facility] Behavior[facility] at [facility] RISK CATEGORY No Risk No Risk No Risk     Assessment and Plan:  Eric Joyce is a 78 y.o. year old male with a history of  ITP, splenomegaly, monoclonal lymphocytosis, pulmonary embolism, CAD s/p coronary stents, diabetes with neuropathy, hyperlipidemia, hypertension, who  presents for follow up for below.   1. MDD (major depressive disorder), recurrent, in partial remission (HCC) 2. Anxiety disorder, unspecified type # PTSD Acute stressors include: loss of his brother in law in [place]   Other stressors include: deployment to [place]/combat, loss of a lady a few years ago    History:  dx depression when he was in [place], years ago, was on fluoxetine in the past. Originally on bupropion  150 mg daily (newly started) . Although he did try CPT through [place], he reports limited benefit.  PA does not cover for insurance outside of [place].   There has been overall improvement in depressive symptoms and anxiety since uptitration of sertraline .  Although he continues to have avoidance, occasional flashback, these symptoms has been manageable.  Will continue current dose of sertraline  to target depression, PTSD, anxiety along with bupropion  adjunctive treatment for depression.    3. Alcohol  use disorder He reports concern of relapse in alcohol  use on weekends, although he denies craving for alcohol .  Will plan to start naltrexone after reviewing labs.  Discussed potential risk of LFT abnormality.    # Insomnia - reportedly had sleep test in 2023 at [facility]. Did not hear the result. He is not interested in CPAP machine.  He reports middle insomnia, which he only attributes to nocturia.  Provided psycho education about sleep hygiene.    Plan  Continue bupropion  150 mg daily (300 mg caused him feeling uncomfortable)- he declined a refill Continue sertraline  100 mg at night Please send a result of labs (CMP, including AST/ALT/Alk Phos) - will also send a order to [facility] After reviewing labs, start naltrexone 25 mg at night  Next appointment: 6/24 at 10 am, video - on gabapentin  300 mg am, 600 mg at night  - on cetrizine for itchiness - on jardiance   The patient demonstrates the following risk factors for suicide: Chronic risk factors for suicide include: psychiatric disorder of  depression . Acute risk factors for suicide include: unemployment. Protective factors for this patient include: coping skills and hope for the future. Considering these factors, the overall suicide risk at this point appears to be low. Patient is appropriate for outpatient follow up.    Past trials  of medication: fluoxetine, bupropion , trazodone (lethargic), Ambien, melatonin, Xanax        Collaboration of Care: Collaboration of Care: {BH OP Collaboration of Care:21014065}  Patient/Guardian was advised Release of Information must be obtained prior to any record release in order to collaborate their care with an outside provider. Patient/Guardian was advised if they have not already done so to contact the registration department to sign all necessary forms in order for us  to release information regarding their care.   Consent: Patient/Guardian gives verbal consent for treatment and assignment of benefits for services provided during this visit. Patient/Guardian expressed understanding and agreed to proceed.    Todd Fossa, MD 08/21/2023, 8:49 AM

## 2023-08-26 ENCOUNTER — Telehealth (INDEPENDENT_AMBULATORY_CARE_PROVIDER_SITE_OTHER): Admitting: Psychiatry

## 2023-08-26 ENCOUNTER — Encounter: Payer: Self-pay | Admitting: Psychiatry

## 2023-08-26 DIAGNOSIS — F3341 Major depressive disorder, recurrent, in partial remission: Secondary | ICD-10-CM

## 2023-08-26 DIAGNOSIS — F419 Anxiety disorder, unspecified: Secondary | ICD-10-CM | POA: Diagnosis not present

## 2023-08-26 DIAGNOSIS — F1090 Alcohol use, unspecified, uncomplicated: Secondary | ICD-10-CM | POA: Diagnosis not present

## 2023-08-26 DIAGNOSIS — F4310 Post-traumatic stress disorder, unspecified: Secondary | ICD-10-CM

## 2023-08-26 MED ORDER — SERTRALINE HCL 100 MG PO TABS: 100.0000 mg | ORAL_TABLET | Freq: Every day | ORAL | 0 refills | Status: DC

## 2023-08-27 DIAGNOSIS — E1142 Type 2 diabetes mellitus with diabetic polyneuropathy: Secondary | ICD-10-CM | POA: Diagnosis not present

## 2023-08-27 DIAGNOSIS — L97521 Non-pressure chronic ulcer of other part of left foot limited to breakdown of skin: Secondary | ICD-10-CM | POA: Diagnosis not present

## 2023-09-24 DIAGNOSIS — L97521 Non-pressure chronic ulcer of other part of left foot limited to breakdown of skin: Secondary | ICD-10-CM | POA: Diagnosis not present

## 2023-09-24 DIAGNOSIS — L03116 Cellulitis of left lower limb: Secondary | ICD-10-CM | POA: Diagnosis not present

## 2023-09-24 DIAGNOSIS — E1142 Type 2 diabetes mellitus with diabetic polyneuropathy: Secondary | ICD-10-CM | POA: Diagnosis not present

## 2023-09-24 NOTE — Progress Notes (Signed)
 Chief Complaint:   Chief Complaint  Patient presents with  . Follow-up    Left foot ulcer     Subjective  HPI  Eric Joyce is a 78 y.o. male who presents for Follow-up (Left foot ulcer ) History of Present Illness Eric Joyce is a 78 year old male who presents with a foot ulcer.  He has been managing the foot ulcer by applying Band-Aids and has noticed a callus forming. Recently, he removed a piece of the callus while changing the bandage, describing it as a 'pretty good piece.' The callus was loose, and he did not have to exert much effort to remove it. He is concerned about whether this action was beneficial or harmful.  He has been trimming the callus to prevent further pressure buildup.  He changes the dressing regularly and sometimes uses a small bandage on the glistening area. However, in a hurry, he used a larger bandage, which led to the bandage pulling off some skin when removed.  Review of Systems  Patient Active Problem List  Diagnosis  . Hyperlipidemia  . Myocardial infarction (CMS/HHS-HCC)  . CAD (coronary artery disease)  . Diabetes mellitus type 2, uncomplicated (CMS/HHS-HCC)  . Nephrolithiasis  . Depression  . Basal cell carcinoma  . Alcoholism (CMS/HHS-HCC)  . H/O cardiac catheterization  . S/P cardiac catheterization  . Class 1 obesity due to excess calories without serious comorbidity with body mass index (BMI) of 30.0 to 30.9 in adult  . Urinary frequency  . Abdominal distension (gaseous)  . Actinic keratosis  . Asymptomatic varicose veins  . COVID-19  . Diabetes mellitus with neuropathy (CMS/HHS-HCC)  . Diabetic neuropathy (CMS/HHS-HCC)  . Encounter for immunization  . Hav (hallux abducto valgus), unspecified laterality  . History of other malignant neoplasm of skin  . Hypertensive disorder  . Nocturia  . Osteoarthrosis  . Other specified postprocedural states  . Other urticaria  . Chronic post-traumatic stress disorder  . Presence of intraocular  lens  . Rosacea  . Seborrheic dermatitis  . Viral warts  . Acute pulmonary embolism without acute cor pulmonale (CMS/HHS-HCC)  . Thrombocytopenia ()  . Corns and callosities  . Diarrhea  . Exposure to potentially hazardous substance  . Fever and chills, unspecified  . Generalized weakness  . History of pulmonary embolism  . Hypophosphatemia  . Lactic acidosis  . Melena  . Monoclonal B-cell lymphocytosis of undetermined significance  . Muscle weakness (generalized)  . Non-pressure chronic ulcer of other part of left foot limited to breakdown of skin (CMS/HHS-HCC)  . Obstructive sleep apnea  . Other neutropenia ()  . Other specified follicular disorders  . Problem related to unspecified psychosocial circumstances  . Abscess of anal and rectal regions  . SOB (shortness of breath)  . Splenomegaly  . Umbilical hernia without obstruction or gangrene  . Unsteadiness on feet  . Urgency of urination    Outpatient Medications Prior to Visit  Medication Sig Dispense Refill  . aspirin  81 MG EC tablet Take 81 mg by mouth once daily    . atorvastatin (LIPITOR) 40 MG tablet TAKE 1 TABLET BY MOUTH DAILY 90 tablet 3  . buPROPion  (WELLBUTRIN  XL) 150 MG XL tablet Take 150 mg by mouth once daily    . cetirizine (ZYRTEC) 10 MG tablet TAKE ONE TABLET BY MOUTH EVERY DAY FOR ALLERGIES    . co-enzyme Q-10, ubiquinone, 100 mg capsule Take 100 mg by mouth once daily Reported on 03/17/2015 (Patient not taking: Reported on 08/04/2023)    .  cyanocobalamin (VITAMIN B12) 1000 MCG tablet Take 1 tablet by mouth once daily (Patient not taking: Reported on 08/04/2023)    . empagliflozin (JARDIANCE) 10 mg tablet Take 10 mg by mouth once daily (Patient not taking: Reported on 08/04/2023)    . empagliflozin (JARDIANCE) 25 mg tablet Take 12.5 mg by mouth once daily    . fish oil-dha-epa 1,200-144-216 mg Cap Take 1 capsule by mouth 2 (two) times daily. (Patient not taking: Reported on 08/04/2023)    . gabapentin  (NEURONTIN )  300 MG capsule Take 300 mg by mouth 3 (three) times daily 1 cap qAM and 2 caps qHS    . ketoconazole (NIZORAL) 2 % shampoo SHAMPOO SUFFICIENT AMOUNT TOPICALLY EVERY DAY    . lisinopriL (ZESTRIL) 10 MG tablet take 1 tablet by mouth once daily 90 tablet 3  . metFORMIN (GLUCOPHAGE) 500 MG tablet TAKE ONE TABLET TWICE A DAY WITH MEALS 180 tablet 3  . metoprolol  SUCCinate (TOPROL -XL) 25 MG XL tablet TAKE 1 TABLET BY MOUTH ONCE DAILY. 90 tablet 3  . multivitamin tablet Take 1 tablet by mouth once daily.    . naltrexone  (REVIA ) 50 mg tablet Take 25 mg by mouth once daily    . oxybutynin  (DITROPAN ) 5 mg tablet Take 1 tablet (5 mg total) by mouth 3 (three) times daily 90 tablet 0  . sertraline  (ZOLOFT ) 100 MG tablet Take 100 mg by mouth at bedtime    . vibegron  75 mg Tab Take 1 tablet by mouth once daily     No facility-administered medications prior to visit.      Objective  Vitals:   09/24/23 1110  BP: 128/71  Weight: (!) 101.6 kg (224 lb)  Height: 185.4 cm (6' 1)  PainSc:   2  PainLoc: Foot   Body mass index is 29.55 kg/m.  Home Vitals:     Physical Exam SKIN: Skin ulcer healing well with improved appearance, measuring 4mm by 2mm.  Of note he develops a blister on the dorsal aspect of the foot mainly due to the irritation of the adhesive bandage.  We discussed trying not to apply adhesive bandages to this region        The following labs were personally reviewed: - Lab Results  Component Value Date   WBC 1.3 (LL) 04/04/2021   HGB 14.2 04/04/2021   HCT 40.0 04/04/2021   PLT 10 (LL) 04/04/2021   - Lab Results  Component Value Date   NA 132 (L) 04/04/2021   K 3.8 04/04/2021   CL 96 (L) 04/04/2021   CO2 26.1 04/04/2021   BUN 22 04/04/2021   CREATININE 0.7 04/04/2021   GLUCOSE 187 (H) 04/04/2021   - Lab Results  Component Value Date   NA 132 (L) 04/04/2021   K 3.8 04/04/2021   CL 96 (L) 04/04/2021   CO2 26.1 04/04/2021   BUN 22 04/04/2021   CREATININE 0.7  04/04/2021   CALCIUM  8.6 (L) 04/04/2021   ALB 3.3 (L) 04/04/2021   TBILI 2.8 (H) 04/04/2021   ALKPHOS 188 (H) 04/04/2021   AST 165 (H) 04/04/2021   ALT 212 (H) 04/04/2021   GLUCOSE 187 (H) 04/04/2021   GFR 110 04/04/2021   - Lab Results  Component Value Date   HGBA1C 6.7 (H) 04/04/2021      Results Procedure: Callus Trimming Description: Callus was debrided to prevent further buildup of pressure.     Assessment/Plan:   Assessment & Plan Ulcer of left foot, limited to breakdown of skin The  ulcer on the left foot is improving with a reduction in size to 4 mm by 2 mm. Healing is slow but progressing. Risk of callus formation may obscure true size. Care is needed to avoid skin or callus pulling to prevent further damage. Minimize adhesive bandage use to avoid skin irritation and weakening. - Apply a nonadherent bandage to the ulcer. - Trim callus to prevent pressure buildup. - Schedule follow-up appointment in one month.  Diagnoses and all orders for this visit:  Type 2 diabetes mellitus with diabetic polyneuropathy, without long-term current use of insulin  (CMS/HHS-HCC)  Ulcer of left foot, limited to breakdown of skin (CMS/HHS-HCC)  Cellulitis of left foot          Future Appointments     Date/Time Provider Department Center Visit Type   11/13/2023 8:30 AM [facility] WST CARD [facility] CLINIC STRESS TEST   11/18/2023 9:30 AM Paraschos, Marsa, MD [facility] C FOLLOW UP   01/28/2024 10:30 AM Bronstein, Alm Hail, MD [facility] PHYSICAL       There are no Patient Instructions on file for this visit.   This note has been created using automated tools and reviewed for accuracy by JUSTIN ALLEN FOWLER.

## 2023-09-29 ENCOUNTER — Other Ambulatory Visit: Payer: Self-pay | Admitting: Psychiatry

## 2023-10-04 NOTE — Progress Notes (Unsigned)
 Virtual Visit via Video Note  I connected with Eric Joyce on 10/10/23 at 11:30 AM EDT by a video enabled telemedicine application and verified that I am speaking with the correct person using two identifiers.  Location: Patient: home Provider: home office Persons participated in the visit- patient, provider    I discussed the limitations of evaluation and management by telemedicine and the availability of in person appointments. The patient expressed understanding and agreed to proceed.   I discussed the assessment and treatment plan with the patient. The patient was provided an opportunity to ask questions and all were answered. The patient agreed with the plan and demonstrated an understanding of the instructions.   The patient was advised to call back or seek an in-person evaluation if the symptoms worsen or if the condition fails to improve as anticipated.  Katheren Sleet, MD    [facility] MD/PA/NP OP Progress Note  10/10/2023 12:08 PM Eric Joyce  MRN:  [id number]  Chief Complaint:  Chief Complaint  Patient presents with   Follow-up   HPI:  This is a follow-up appointment for depression, anxiety and PTSD.  He states that he has been doing good. Diesel is doing a lot better.  He takes a walk, although he does not like it as Diesel is strong.  He has been meeting with his friend.  He will be a grandfather in Feb.  His daughter is in [place], and he is hoping to visit there this fall.  His mood has been good.  He just feels mundane, and bored.  Although he has not gone to a meeting as he thought he does not get anything anymore, he is open to reconnect so that he can contribute.  He is also considering to be back to church.  He has strange dreams, although it is not as bad compared to before, and that less frequent.  It is just bizarre, and he is just trying to get used to it.  He denies insomnia.  He denies feeling depressed.  Anxiety is manageable, and he denies panic attacks.  He  denies SI, HI, hallucinations. He states that he has been 6 weeks sobriety.  He has craving, but he has decided to quit.  He denies any anxiety related to this, and denies any side effect from naltrexone .    Substance use   Tobacco Alcohol  Other substances/  Current   Sobriety for six weeks, + craving  2-4 cocktails on his birthday, weekend, drinks once a month, denies  Past   Drink heavily, last in 05/09/2014 denies  Past Treatment   (Did not find AA to be helpful)        Household: by himself Marital status:never married Number of children: 23 yo daughter (lives in [place]. His mother is [place]) Employment: retired in 2016, works at [employer]. 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 Education:   He is originally from [place], [place]. He lived in [place] for 11 years because of his daughter. He had a platonic relationship with the mother of his daughter, who is [place].  Visit Diagnosis:    ICD-10-CM   1. MDD (major depressive disorder), recurrent, in partial remission (HCC)  F33.41     2. Anxiety disorder, unspecified type  F41.9     3. PTSD (post-traumatic stress disorder)  F43.10     4. Alcohol  use disorder  F10.90       Past Psychiatric History: Please see initial evaluation for full details. I have reviewed the  history. No updates at this time.     Past Medical History:  Past Medical History:  Diagnosis Date   Diabetes mellitus without complication (HCC)    Hyperlipidemia    Hypertension    Myocardial infarction [facility])     Past Surgical History:  Procedure Laterality Date   APPENDECTOMY     CARDIAC SURGERY     2 stents   EYE SURGERY     REPLACEMENT TOTAL KNEE Right    TONSILLECTOMY AND ADENOIDECTOMY      Family Psychiatric History: Please see initial evaluation for full details. I have reviewed the history. No updates at this time.     Family History:  Family History  Problem Relation Age of Onset   Lung cancer Mother    Heart disease Father     Diabetes Paternal Uncle    Diabetes Paternal Grandmother     Social History:  Social History   Socioeconomic History   Marital status: Single    Spouse name: Not on file   Number of children: Not on file   Years of education: Not on file   Highest education level: Not on file  Occupational History   Not on file  Tobacco Use   Smoking status: Former    Types: Cigarettes    Passive exposure: Past   Smokeless tobacco: Never  Vaping Use   Vaping status: Some Days  Substance and Sexual Activity   Alcohol  use: Not Currently   Drug use: Yes    Types: Marijuana   Sexual activity: Yes  Other Topics Concern   Not on file  Social History Narrative   Not on file   Social Drivers of Health   Financial Resource Strain: Low Risk  (01/27/2023)   Received from [facility]   Overall Financial Resource Strain (CARDIA)    Difficulty of Paying Living Expenses: Not hard at all  Food Insecurity: No Food Insecurity (01/27/2023)   Received from [facility]   Hunger Vital Sign    Within the past 12 months, you worried that your food would run out before you got the money to buy more.: Never true    Within the past 12 months, the food you bought just didn't last and you didn't have money to get more.: Never true  Transportation Needs: Unknown (01/27/2023)   Received from [facility]   PRAPARE - Transportation    In the past 12 months, has lack of transportation kept you from medical appointments or from getting medications?: No    Lack of Transportation (Non-Medical): Patient declined  Physical Activity: Not on file  Stress: Not on file  Social Connections: Not on file    Allergies:  Allergies  Allergen Reactions   Sulfamethoxazole-Trimethoprim     Metabolic Disorder Labs: Lab Results  Component Value Date   HGBA1C 6.6 (H) 04/05/2021   MPG 143 04/05/2021   No results found for: PROLACTIN No results found for: CHOL,  TRIG, HDL, CHOLHDL, VLDL, LDLCALC Lab Results  Component Value Date   TSH 0.952 04/04/2021    Therapeutic Level Labs: No results found for: LITHIUM No results found for: VALPROATE No results found for: CBMZ  Current Medications: Current Outpatient Medications  Medication Sig Dispense Refill   aspirin  81 MG chewable tablet Chew 81 mg by mouth daily.     atorvastatin (LIPITOR) 40 MG tablet Take 40 mg by mouth daily.     buPROPion  (WELLBUTRIN  XL) 150 MG 24 hr tablet  Take 150 mg by mouth daily.     cetirizine (ZYRTEC) 10 MG tablet Take 10 mg by mouth daily.     Coenzyme Q10 100 MG capsule Take 100 mg by mouth daily.     empagliflozin (JARDIANCE) 25 MG TABS tablet Take 25 mg by mouth daily.     gabapentin  (NEURONTIN ) 300 MG capsule Take by mouth. TAKE TWO CAPSULES BY MOUTH IN THE MORNING AND TAKE ONE CAPSULE AT BEDTIME FOR NEUROPATHIC PAIN     ketoconazole (NIZORAL) 2 % shampoo Apply topically.     lisinopril (ZESTRIL) 10 MG tablet Take 1 tablet by mouth daily.     metFORMIN (GLUCOPHAGE) 500 MG tablet Take 500 mg by mouth 2 (two) times daily with a meal.     metoprolol  succinate (TOPROL -XL) 25 MG 24 hr tablet Take 25 mg by mouth daily.     Multiple Vitamins-Minerals (MULTIVITAMIN WITH MINERALS) tablet Take 1 tablet by mouth daily.     naltrexone  (DEPADE) 50 MG tablet Take 1 tablet (50 mg total) by mouth daily. 30 tablet 1   sertraline  (ZOLOFT ) 100 MG tablet Take 1 tablet (100 mg total) by mouth at bedtime. 90 tablet 0   Vibegron  (GEMTESA ) 75 MG TABS Take 1 tablet (75 mg total) by mouth daily. 90 tablet 3   No current facility-administered medications for this visit.     Musculoskeletal: Strength & Muscle Tone: N/A Gait & Station: N/A Patient leans: N/A  Psychiatric Specialty Exam: Review of Systems  Psychiatric/Behavioral:  Negative for agitation, behavioral problems, confusion, decreased concentration, dysphoric mood, hallucinations, self-injury, sleep disturbance  and suicidal ideas. The patient is not nervous/anxious and is not hyperactive.   All other systems reviewed and are negative.   There were no vitals taken for this visit.There is no height or weight on file to calculate BMI.  General Appearance: Well Groomed  Eye Contact:  Good  Speech:  Clear and Coherent  Volume:  Normal  Mood:  good  Affect:  Appropriate, Congruent, and Full Range  Thought Process:  Coherent  Orientation:  Full (Time, Place, and Person)  Thought Content: Logical   Suicidal Thoughts:  No  Homicidal Thoughts:  No  Memory:  Immediate;   Good  Judgement:  Good  Insight:  Good  Psychomotor Activity:  Normal  Concentration:  Concentration: Good and Attention Span: Good  Recall:  Good  Fund of Knowledge: Good  Language: Good  Akathisia:  No  Handed:  Right  AIMS (if indicated): not done  Assets:  Communication Skills Desire for Improvement  ADL's:  Intact  Cognition: WNL  Sleep:  Good   Screenings: GAD-7    Flowsheet Row Office Visit from 01/21/2023 in [facility] Office Visit from 12/02/2022 in [facility] Office Visit from 04/18/2022 in [facility] Counselor from 11/08/2021 in [facility] at [facility]  Total GAD-7 Score 2 3 2 4    PHQ2-9    Flowsheet Row Office Visit from 01/21/2023 in [facility] Office Visit from 12/02/2022 in [facility] Office Visit from 04/18/2022 in [facility] Office Visit from 02/19/2022 in [facility] Counselor from 11/08/2021 in [facility] at [facility] Total Score 3 1 2 1  0  PHQ-9 Total Score 6 -- 5 7 --   Flowsheet Row Office Visit from 01/21/2023 in [facility] Office Visit from 12/02/2022 in [facility] Counselor from 11/08/2021 in [facility] at [facility] RISK CATEGORY No Risk No Risk No Risk     Assessment and Plan:  Eric Joyce is a 78 y.o. year old male with a history of  ITP, splenomegaly, monoclonal lymphocytosis, pulmonary embolism, CAD s/p coronary stents, diabetes with neuropathy, hyperlipidemia, hypertension, who presents for follow up for below.   1. MDD (major depressive disorder), recurrent, in partial remission (HCC) 2. Anxiety disorder, unspecified type 3. PTSD (post-traumatic stress disorder) Other stressors include: deployment to [place]/combat, loss of a lady a few years ago    History:  dx depression when he was in [place], years ago, was on fluoxetine in the past. Originally on bupropion  150 mg daily (newly started) . Although he did try CPT through [place], he reports limited benefit.  PA does not cover for insurance outside of [place].    Does not he continues to struggle with dreams, he denies any other significant mood symptoms since the previous visit.  Will continue current dose of sertraline  to target depression, PTSD, anxiety, along with bupropion  as an active treatment for depression.  Discussed behavioral activation.  He is interested in reconnection to [employer], and/or attending at the church.   # Alcohol  use disorder He has been abstinent from alcohol  for the past 6 weeks, since taking naltrexone .  He experiences craving for alcohol ; will titrate the dose to optimize treatment for alcohol  abstinence.  Discussed potential risk of nausea, LFT abnormality.    # Insomnia - reportedly had sleep test in 2023 at [facility]. Did not hear the result. He is not interested in CPAP machine.  Overall improving. He reports middle insomnia, which he only attributes to nocturia.  Provided psycho education about sleep hygiene.    Plan  Continue bupropion  150 mg  daily (300 mg caused him feeling uncomfortable)- he declined a refill Continue sertraline  100 mg at night Increase naltrexone  50 mg at night  Next appointment: 9/26 at 11 am, video - on gabapentin  300 mg am, 600 mg at night  - on cetrizine for itchiness - on jardiance   The patient demonstrates the following risk factors for suicide: Chronic risk factors for suicide include: psychiatric disorder of depression . Acute risk factors for suicide include: unemployment. Protective factors for this patient include: coping skills and hope for the future. Considering these factors, the overall suicide risk at this point appears to be low. Patient is appropriate for outpatient follow up.    Past trials of medication: fluoxetine, bupropion , trazodone (lethargic), Ambien, melatonin, Xanax        Collaboration of Care: Collaboration of Care: Other reviewed notes in Epic  Patient/Guardian was advised Release of Information must be obtained prior to any record release in order to collaborate their care with an outside provider. Patient/Guardian was advised if they have not already done so to contact the registration department to sign all necessary forms in order for us  to release information regarding their care.   Consent: Patient/Guardian gives verbal consent for treatment and assignment of benefits for services provided during this visit. Patient/Guardian expressed understanding and agreed to proceed.    Katheren Sleet, MD 10/10/2023, 12:08 PM

## 2023-10-10 ENCOUNTER — Telehealth (INDEPENDENT_AMBULATORY_CARE_PROVIDER_SITE_OTHER): Admitting: Psychiatry

## 2023-10-10 ENCOUNTER — Encounter: Payer: Self-pay | Admitting: Psychiatry

## 2023-10-10 DIAGNOSIS — F1090 Alcohol use, unspecified, uncomplicated: Secondary | ICD-10-CM

## 2023-10-10 DIAGNOSIS — F3341 Major depressive disorder, recurrent, in partial remission: Secondary | ICD-10-CM

## 2023-10-10 DIAGNOSIS — F4310 Post-traumatic stress disorder, unspecified: Secondary | ICD-10-CM

## 2023-10-10 DIAGNOSIS — F419 Anxiety disorder, unspecified: Secondary | ICD-10-CM

## 2023-10-10 MED ORDER — NALTREXONE HCL 50 MG PO TABS: 50.0000 mg | ORAL_TABLET | Freq: Every day | ORAL | 1 refills | Status: DC

## 2023-10-10 NOTE — Patient Instructions (Signed)
 Continue bupropion  150 mg daily  Continue sertraline  100 mg at night Increase naltrexone  50 mg at night  Next appointment: 9/26 at 11 am

## 2023-10-16 ENCOUNTER — Encounter: Payer: Self-pay | Admitting: Oncology

## 2023-10-16 ENCOUNTER — Inpatient Hospital Stay: Payer: PPO | Attending: Oncology | Admitting: Oncology

## 2023-10-16 ENCOUNTER — Inpatient Hospital Stay: Payer: PPO

## 2023-10-16 VITALS — BP 112/68 | HR 62 | Temp 96.6°F | Resp 18 | Wt 221.2 lb

## 2023-10-16 DIAGNOSIS — Z801 Family history of malignant neoplasm of trachea, bronchus and lung: Secondary | ICD-10-CM | POA: Insufficient documentation

## 2023-10-16 DIAGNOSIS — Z86711 Personal history of pulmonary embolism: Secondary | ICD-10-CM | POA: Diagnosis not present

## 2023-10-16 DIAGNOSIS — D72820 Lymphocytosis (symptomatic): Secondary | ICD-10-CM | POA: Insufficient documentation

## 2023-10-16 DIAGNOSIS — Z7901 Long term (current) use of anticoagulants: Secondary | ICD-10-CM | POA: Insufficient documentation

## 2023-10-16 DIAGNOSIS — Z87891 Personal history of nicotine dependence: Secondary | ICD-10-CM | POA: Insufficient documentation

## 2023-10-16 LAB — CMP (CANCER CENTER ONLY)
ALT: 40 U/L (ref 0–44)
AST: 37 U/L (ref 15–41)
Albumin: 4.5 g/dL (ref 3.5–5.0)
Alkaline Phosphatase: 92 U/L (ref 38–126)
Anion gap: 8 (ref 5–15)
BUN: 16 mg/dL (ref 8–23)
CO2: 25 mmol/L (ref 22–32)
Calcium: 9.7 mg/dL (ref 8.9–10.3)
Chloride: 103 mmol/L (ref 98–111)
Creatinine: 0.82 mg/dL (ref 0.61–1.24)
GFR, Estimated: >60 mL/min (ref >60)
Glucose, Bld: 153 mg/dL — ABNORMAL HIGH (ref 70–99)
Potassium: 4 mmol/L (ref 3.5–5.1)
Sodium: 136 mmol/L (ref 135–145)
Total Bilirubin: 0.8 mg/dL (ref 0.0–1.2)
Total Protein: 7.6 g/dL (ref 6.5–8.1)

## 2023-10-16 LAB — CBC WITH DIFFERENTIAL (CANCER CENTER ONLY)
Abs Immature Granulocytes: 0.02 K/uL (ref 0.00–0.07)
Basophils Absolute: 0 K/uL (ref 0.0–0.1)
Basophils Relative: 0 %
Eosinophils Absolute: 0.2 K/uL (ref 0.0–0.5)
Eosinophils Relative: 2 %
HCT: 49.6 % (ref 39.0–52.0)
Hemoglobin: 16.6 g/dL (ref 13.0–17.0)
Immature Granulocytes: 0 %
Lymphocytes Relative: 16 %
Lymphs Abs: 1.7 K/uL (ref 0.7–4.0)
MCH: 32.5 pg (ref 26.0–34.0)
MCHC: 33.5 g/dL (ref 30.0–36.0)
MCV: 97.1 fL (ref 80.0–100.0)
Monocytes Absolute: 0.6 K/uL (ref 0.1–1.0)
Monocytes Relative: 5 %
Neutro Abs: 8.3 K/uL — ABNORMAL HIGH (ref 1.7–7.7)
Neutrophils Relative %: 77 %
Platelet Count: 172 K/uL (ref 150–400)
RBC: 5.11 MIL/uL (ref 4.22–5.81)
RDW: 12.3 % (ref 11.5–15.5)
WBC Count: 10.8 K/uL — ABNORMAL HIGH (ref 4.0–10.5)
nRBC: 0 % (ref 0.0–0.2)

## 2023-10-16 NOTE — Assessment & Plan Note (Addendum)
 Monoclonal B-cell lymphocytosis.  Previous bone marrow biopsy showed   2% clonal B cells.    Flowcytometry showed A small CD5+, CD23+ clone was reported  with lambda light chain restriction, <1%, <5000/ul Patient does not have any constitutional symptoms.  CBC shows  mild leukocytosis, no lymphocytosis, predominantly neutrophilia.  No constitutional symptoms.   Observation.

## 2023-10-16 NOTE — Progress Notes (Signed)
 Hematology/Oncology Progress note [phone] [phone]     Patient Care Team: Babara Call, MD as PCP - General (Oncology)   REASON FOR VISIT Follow-up for ITP, splenomegaly, monoclonal lymphocytosis, pulmonary embolism.   ASSESSMENT & PLAN:   Monoclonal B-cell lymphocytosis of undetermined significance Monoclonal B-cell lymphocytosis.  Previous bone marrow biopsy showed   2% clonal B cells.    Flowcytometry showed A small CD5+, CD23+ clone was reported  with lambda light chain restriction, <1%, <5000/ul Patient does not have any constitutional symptoms.  CBC shows  mild leukocytosis, no lymphocytosis, predominantly neutrophilia.  No constitutional symptoms.   Observation.  History of pulmonary embolism #Provoked acute pulmonary embolism due to immobilization secondary to hospitalization. S/p  total of 6 months of anticoagulation.  negative factor V Leiden mutation and prothrombin gene mutation. Off anticoagulation.  He may take Eliquis  2.5 mg twice daily prophylactically if he expect any immobilization circumstances long distance car ride, flights, postsurgery, etc.   Orders Placed This Encounter  Procedures   CMP (Cancer Center only)    Standing Status:   Future    Expected Date:   10/15/2024    Expiration Date:   10/15/2024   CBC with Differential (Cancer Center Only)    Standing Status:   Future    Expected Date:   10/15/2024    Expiration Date:   10/15/2024   Flow cytometry panel-leukemia/lymphoma work-up    Standing Status:   Future    Expected Date:   10/15/2024    Expiration Date:   10/15/2024   Follow up 1 year All questions were answered. The patient knows to call the clinic with any problems, questions or concerns.  Call Babara, MD, PhD [facility] Hematology Oncology 10/16/2023   INTERVAL HISTORY 78 y.o. male presents for follow-up of ITP, splenomegaly, monoclonal lymphocytosis, pulmonary embolism. #04/04/2021 - 04/12/2021 patient was  hospitalized for generalized weakness, fever.  Developed melena and diarrhea after treatment of amoxicillin .  He has acute neutropenia, thrombocytopenia.  Patient had extensive work-up during the admission.  Respiratory viral panel negative, GI pathogen panel negative, ANA negative, Lyme disease panel negative, varicella negative, Bartonella negative.  Erlichia negative. CRP is elevated.  ESR is elevated, RPR negative, adequate vitamin B12 and folate.ANA is negative.   Normal fibrinogen.  Normal haptoglobin, Negative hepatitis and HIV.    Flow low cytometry with monoclonal B-cell lymphocytosis of undetermined significance Mildly elevated EBV PCR copies which were not clinically significant per infectious disease specialist.  CMV PCR is negative. PE/superficial venous thrombosis of the right great saphenous vein throughout the calf and thigh.  Anticoagulation was initially held due to severe thrombocytopenia. thrombocytopenia, finished 5 days of dexamethasone  40 mg daily and IVIG x 2 days.  Platelet improved to 49,000 and the patient was discharged on Eliquis  5 mg twice daily with no loading dose.  04/10/2021, bone marrow biopsy showed minor abnormal B-cell population identified, 2% of all cells. CD20, CD5 and CD200 positive.  Extremely dim/negative staining for surface immunoglobulin light chain.  Abundant T cells display nonspecific changes.  04/30/2021, PET scan showed similar moderate splenomegaly with background max SUV between mediastinal blood pool and liver.  No suspicious hypermetabolic splenic foci.  Diffuse homogeneous might hypermetabolic bone marrow activity.  Nonspecific.  No hypermetabolic cervical/thoracic/abdominal pelvic adenopathy.  Aortic atherosclerosis.  Patient reports feeling well.  He takes Eliquis  5 mg twice daily and tolerates well.  No bleeding events.  Denies any shortness of breath, chest pain, leg swelling. He has  not sleep well recently.  INTERVAL HISTORY Ger Ringenberg  Eric Joyce is a 78 y.o. male who has above history reviewed by me today presents for follow up visit for management of history of ITP, monoclonal B-cell lymphocytosis. Patient reports feeling well today.  No bleeding, easy bruising.  No unintentional weight loss, night sweats, fever.  Review of Systems  Constitutional:  Negative for appetite change, chills, fatigue, fever and unexpected weight change.  HENT:   Negative for hearing loss and voice change.   Eyes:  Negative for eye problems and icterus.  Respiratory:  Negative for chest tightness, cough and shortness of breath.   Cardiovascular:  Negative for chest pain and leg swelling.  Gastrointestinal:  Negative for abdominal distention and abdominal pain.  Endocrine: Negative for hot flashes.  Genitourinary:  Negative for difficulty urinating, dysuria and frequency.   Musculoskeletal:  Negative for arthralgias.  Skin:  Negative for itching and rash.  Neurological:  Negative for light-headedness and numbness.  Hematological:  Negative for adenopathy. Does not bruise/bleed easily.  Psychiatric/Behavioral:  Negative for confusion.       Allergies  Allergen Reactions   Sulfamethoxazole-Trimethoprim      Past Medical History:  Diagnosis Date   Diabetes mellitus without complication (HCC)    Hyperlipidemia    Hypertension    Myocardial infarction [facility])      Past Surgical History:  Procedure Laterality Date   APPENDECTOMY     CARDIAC SURGERY     2 stents   EYE SURGERY     REPLACEMENT TOTAL KNEE Right    TONSILLECTOMY AND ADENOIDECTOMY      Social History   Socioeconomic History   Marital status: Single    Spouse name: Not on file   Number of children: Not on file   Years of education: Not on file   Highest education level: Not on file  Occupational History   Not on file  Tobacco Use   Smoking status: Former    Types: Cigarettes    Passive exposure: Past   Smokeless tobacco: Never  Vaping Use   Vaping status: Some  Days  Substance and Sexual Activity   Alcohol  use: Not Currently   Drug use: Yes    Types: Marijuana   Sexual activity: Yes  Other Topics Concern   Not on file  Social History Narrative   Not on file   Social Drivers of Health   Financial Resource Strain: Low Risk  (01/27/2023)   Received from [facility]   Overall Financial Resource Strain (CARDIA)    Difficulty of Paying Living Expenses: Not hard at all  Food Insecurity: No Food Insecurity (01/27/2023)   Received from [facility]   Hunger Vital Sign    Within the past 12 months, you worried that your food would run out before you got the money to buy more.: Never true    Within the past 12 months, the food you bought just didn't last and you didn't have money to get more.: Never true  Transportation Needs: Unknown (01/27/2023)   Received from [facility]   PRAPARE - Transportation    In the past 12 months, has lack of transportation kept you from medical appointments or from getting medications?: No    Lack of Transportation (Non-Medical): Patient declined  Physical Activity: Not on file  Stress: Not on file  Social Connections: Not on file  Intimate Partner Violence: Not on file    Family History  Problem  Relation Age of Onset   Lung cancer Mother    Heart disease Father    Diabetes Paternal Uncle    Diabetes Paternal Grandmother      Current Outpatient Medications:    aspirin  81 MG chewable tablet, Chew 81 mg by mouth daily., Disp: , Rfl:    atorvastatin (LIPITOR) 40 MG tablet, Take 40 mg by mouth daily., Disp: , Rfl:    buPROPion  (WELLBUTRIN  XL) 150 MG 24 hr tablet, Take 150 mg by mouth daily., Disp: , Rfl:    cetirizine (ZYRTEC) 10 MG tablet, Take 10 mg by mouth daily., Disp: , Rfl:    Coenzyme Q10 100 MG capsule, Take 100 mg by mouth daily., Disp: , Rfl:    empagliflozin (JARDIANCE) 25 MG TABS tablet, Take 25 mg by mouth daily., Disp: , Rfl:    gabapentin   (NEURONTIN ) 300 MG capsule, Take by mouth. TAKE TWO CAPSULES BY MOUTH IN THE MORNING AND TAKE ONE CAPSULE AT BEDTIME FOR NEUROPATHIC PAIN, Disp: , Rfl:    ketoconazole (NIZORAL) 2 % shampoo, Apply topically., Disp: , Rfl:    lisinopril (ZESTRIL) 10 MG tablet, Take 1 tablet by mouth daily., Disp: , Rfl:    metFORMIN (GLUCOPHAGE) 500 MG tablet, Take 500 mg by mouth 2 (two) times daily with a meal., Disp: , Rfl:    metoprolol  succinate (TOPROL -XL) 25 MG 24 hr tablet, Take 25 mg by mouth daily., Disp: , Rfl:    Multiple Vitamins-Minerals (MULTIVITAMIN WITH MINERALS) tablet, Take 1 tablet by mouth daily., Disp: , Rfl:    naltrexone  (DEPADE) 50 MG tablet, Take 1 tablet (50 mg total) by mouth daily., Disp: 30 tablet, Rfl: 1   sertraline  (ZOLOFT ) 100 MG tablet, Take 1 tablet (100 mg total) by mouth at bedtime., Disp: 90 tablet, Rfl: 0   Vibegron  (GEMTESA ) 75 MG TABS, Take 1 tablet (75 mg total) by mouth daily., Disp: 90 tablet, Rfl: 3  Physical exam:  Vitals:   10/16/23 0948  BP: 112/68  Pulse: 62  Resp: 18  Temp: (!) 96.6 F (35.9 C)  TempSrc: Tympanic  SpO2: 97%  Weight: 221 lb 3.2 oz (100.3 kg)   Physical Exam Constitutional:      General: He is not in acute distress. HENT:     Head: Normocephalic and atraumatic.  Eyes:     General: No scleral icterus. Cardiovascular:     Rate and Rhythm: Normal rate and regular rhythm.     Heart sounds: Normal heart sounds.  Pulmonary:     Effort: Pulmonary effort is normal. No respiratory distress.     Breath sounds: No wheezing.  Abdominal:     General: Bowel sounds are normal. There is no distension.     Palpations: Abdomen is soft.  Musculoskeletal:        General: No deformity. Normal range of motion.     Cervical back: Normal range of motion and neck supple.  Skin:    General: Skin is warm and dry.     Findings: No erythema or rash.  Neurological:     Mental Status: He is alert and oriented to person, place, and time. Mental status is  at baseline.     Cranial Nerves: No cranial nerve deficit.     Coordination: Coordination normal.  Psychiatric:        Mood and Affect: Mood normal.        Latest Ref Rng & Units 10/16/2023    9:40 AM  CMP  Glucose 70 - 99  mg/dL 846   BUN 8 - 23 mg/dL 16   Creatinine 9.38 - 1.24 mg/dL 9.17   Sodium 864 - 854 mmol/L 136   Potassium 3.5 - 5.1 mmol/L 4.0   Chloride 98 - 111 mmol/L 103   CO2 22 - 32 mmol/L 25   Calcium  8.9 - 10.3 mg/dL 9.7   Total Protein 6.5 - 8.1 g/dL 7.6   Total Bilirubin 0.0 - 1.2 mg/dL 0.8   Alkaline Phos 38 - 126 U/L 92   AST 15 - 41 U/L 37   ALT 0 - 44 U/L 40       Latest Ref Rng & Units 10/16/2023    9:40 AM  CBC  WBC 4.0 - 10.5 K/uL 10.8   Hemoglobin 13.0 - 17.0 g/dL 83.3   Hematocrit 60.9 - 52.0 % 49.6   Platelets 150 - 400 K/uL 172     RADIOGRAPHIC STUDIES: I have personally reviewed the radiological images as listed and agreed with the findings in the report. No results found.

## 2023-10-16 NOTE — Assessment & Plan Note (Signed)
#  Provoked acute pulmonary embolism due to immobilization secondary to hospitalization. S/p  total of 6 months of anticoagulation.  negative factor V Leiden mutation and prothrombin gene mutation. Off anticoagulation.  He may take Eliquis 2.5 mg twice daily prophylactically if he expect any immobilization circumstances long distance car ride, flights, postsurgery, etc.

## 2023-10-22 LAB — COMP PANEL: LEUKEMIA/LYMPHOMA: Immunophenotypic Profile: 0

## 2023-10-27 DIAGNOSIS — E1142 Type 2 diabetes mellitus with diabetic polyneuropathy: Secondary | ICD-10-CM | POA: Diagnosis not present

## 2023-10-27 DIAGNOSIS — S90822A Blister (nonthermal), left foot, initial encounter: Secondary | ICD-10-CM | POA: Diagnosis not present

## 2023-10-27 DIAGNOSIS — L97521 Non-pressure chronic ulcer of other part of left foot limited to breakdown of skin: Secondary | ICD-10-CM | POA: Diagnosis not present

## 2023-10-27 DIAGNOSIS — L03116 Cellulitis of left lower limb: Secondary | ICD-10-CM | POA: Diagnosis not present

## 2023-10-29 ENCOUNTER — Encounter: Payer: Self-pay | Admitting: Urology

## 2023-11-11 DIAGNOSIS — E113393 Type 2 diabetes mellitus with moderate nonproliferative diabetic retinopathy without macular edema, bilateral: Secondary | ICD-10-CM | POA: Diagnosis not present

## 2023-11-11 DIAGNOSIS — Z961 Presence of intraocular lens: Secondary | ICD-10-CM | POA: Diagnosis not present

## 2023-11-25 NOTE — Progress Notes (Deleted)
 BH MD/PA/NP OP Progress Note  11/25/2023 9:16 AM Eric Joyce  MRN:  [id number]  Chief Complaint: No chief complaint on file.  HPI: ***   Substance use   Tobacco Alcohol  Other substances/  Current   Sobriety for six weeks, + craving   2-4 cocktails on his birthday, weekend, drinks once a month, denies  Past   Drink heavily, last in 05/09/2014 denies  Past Treatment   (Did not find AA to be helpful)        Household: by himself Marital status:never married Number of children: 49 yo daughter (lives in [place]. His mother is [place]) Employment: retired in 2016, works at [employer]. 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 Education:   He is originally from [place], [place]. He lived in [place] for 11 years because of his daughter. He had a platonic relationship with the mother of his daughter, who is [place].  Visit Diagnosis: No diagnosis found.  Past Psychiatric History: Please see initial evaluation for full details. I have reviewed the history. No updates at this time.     Past Medical History:  Past Medical History:  Diagnosis Date   Diabetes mellitus without complication (HCC)    Hyperlipidemia    Hypertension    Myocardial infarction [facility])     Past Surgical History:  Procedure Laterality Date   APPENDECTOMY     CARDIAC SURGERY     2 stents   EYE SURGERY     REPLACEMENT TOTAL KNEE Right    TONSILLECTOMY AND ADENOIDECTOMY      Family Psychiatric History: Please see initial evaluation for full details. I have reviewed the history. No updates at this time.     Family History:  Family History  Problem Relation Age of Onset   Lung cancer Mother    Heart disease Father    Diabetes Paternal Uncle    Diabetes Paternal Grandmother     Social History:  Social History   Socioeconomic History   Marital status: Single    Spouse name: Not on file   Number of children: Not on file   Years of education: Not on file   Highest education level: Not on file   Occupational History   Not on file  Tobacco Use   Smoking status: Former    Types: Cigarettes    Passive exposure: Past   Smokeless tobacco: Never  Vaping Use   Vaping status: Some Days  Substance and Sexual Activity   Alcohol  use: Not Currently   Drug use: Yes    Types: Marijuana   Sexual activity: Yes  Other Topics Concern   Not on file  Social History Narrative   Not on file   Social Drivers of Health   Financial Resource Strain: Low Risk  (01/27/2023)   Received [facility]   Overall Financial Resource Strain (CARDIA)    Difficulty of Paying Living Expenses: Not hard at all  Food Insecurity: No Food Insecurity (01/27/2023)   Received from [facility]   Hunger Vital Sign    Within the past 12 months, you worried that your food would run out before you got the money to buy more.: Never true    Within the past 12 months, the food you bought just didn't last and you didn't have money to get more.: Never true  Transportation Needs: Unknown (01/27/2023)   Received from [facility] - Transportation    In the past 12 months,  has lack of transportation kept you from medical appointments or from getting medications?: No    Lack of Transportation (Non-Medical): Patient declined  Physical Activity: Not on file  Stress: Not on file  Social Connections: Not on file    Allergies:  Allergies  Allergen Reactions   Sulfamethoxazole-Trimethoprim     Metabolic Disorder Labs: Lab Results  Component Value Date   HGBA1C 6.6 (H) 04/05/2021   MPG 143 04/05/2021   No results found for: PROLACTIN No results found for: CHOL, TRIG, HDL, CHOLHDL, VLDL, LDLCALC Lab Results  Component Value Date   TSH 0.952 04/04/2021    Therapeutic Level Labs: No results found for: LITHIUM No results found for: VALPROATE No results found for: CBMZ  Current Medications: Current Outpatient Medications   Medication Sig Dispense Refill   aspirin  81 MG chewable tablet Chew 81 mg by mouth daily.     atorvastatin (LIPITOR) 40 MG tablet Take 40 mg by mouth daily.     buPROPion  (WELLBUTRIN  XL) 150 MG 24 hr tablet Take 150 mg by mouth daily.     cetirizine (ZYRTEC) 10 MG tablet Take 10 mg by mouth daily.     Coenzyme Q10 100 MG capsule Take 100 mg by mouth daily.     empagliflozin (JARDIANCE) 25 MG TABS tablet Take 25 mg by mouth daily.     gabapentin  (NEURONTIN ) 300 MG capsule Take by mouth. TAKE TWO CAPSULES BY MOUTH IN THE MORNING AND TAKE ONE CAPSULE AT BEDTIME FOR NEUROPATHIC PAIN     ketoconazole (NIZORAL) 2 % shampoo Apply topically.     lisinopril (ZESTRIL) 10 MG tablet Take 1 tablet by mouth daily.     metFORMIN (GLUCOPHAGE) 500 MG tablet Take 500 mg by mouth 2 (two) times daily with a meal.     metoprolol  succinate (TOPROL -XL) 25 MG 24 hr tablet Take 25 mg by mouth daily.     Multiple Vitamins-Minerals (MULTIVITAMIN WITH MINERALS) tablet Take 1 tablet by mouth daily.     naltrexone  (DEPADE) 50 MG tablet Take 1 tablet (50 mg total) by mouth daily. 30 tablet 1   sertraline  (ZOLOFT ) 100 MG tablet Take 1 tablet (100 mg total) by mouth at bedtime. 90 tablet 0   Vibegron  (GEMTESA ) 75 MG TABS Take 1 tablet (75 mg total) by mouth daily. 90 tablet 3   No current facility-administered medications for this visit.     Musculoskeletal: Strength & Muscle Tone: N/A Gait & Station: N/A Patient leans: N/A  Psychiatric Specialty Exam: Review of Systems  There were no vitals taken for this visit.There is no height or weight on file to calculate BMI.  General Appearance: {Appearance:22683}  Eye Contact:  {BHH EYE CONTACT:22684}  Speech:  Clear and Coherent  Volume:  Normal  Mood:  {BHH MOOD:22306}  Affect:  {Affect (PAA):22687}  Thought Process:  Coherent  Orientation:  Full (Time, Place, and Person)  Thought Content: Logical   Suicidal Thoughts:  {ST/HT (PAA):22692}  Homicidal Thoughts:   {ST/HT (PAA):22692}  Memory:  Immediate;   Good  Judgement:  {Judgement (PAA):22694}  Insight:  {Insight (PAA):22695}  Psychomotor Activity:  Normal  Concentration:  Concentration: Good and Attention Span: Good  Recall:  Good  Fund of Knowledge: Good  Language: Good  Akathisia:  No  Handed:  Right  AIMS (if indicated): not done  Assets:  Communication Skills Desire for Improvement  ADL's:  Intact  Cognition: WNL  Sleep:  {BHH GOOD/FAIR/POOR:22877}   Screenings: GAD-7    Garment/textile technologist  Visit from 01/21/2023 in [facility] Office Visit from 12/02/2022 in [facility] Office Visit from 04/18/2022 in [facility] Associates Counselor from 11/08/2021 in [facility] at [facility]  Total GAD-7 Score 2 3 2 4    PHQ2-9    Flowsheet Row Office Visit from 01/21/2023 in [facility] Office Visit from 12/02/2022 in [facility] Office Visit from 04/18/2022 in [facility] Office Visit from 02/19/2022 in [facility] Psychiatric Associates Counselor from 11/08/2021 in [facility] Out[facility] Behavioral Health at [facility] Total Score 3 1 2 1  0  PHQ-9 Total Score 6 -- 5 7 --   Flowsheet Row Office Visit from 01/21/2023 in [facility] Associates Office Visit from 12/02/2022 in [facility] Psychiatric Associates Counselor from 11/08/2021 in [facility] at [facility] RISK CATEGORY No Risk No Risk No Risk     Assessment and Plan:  Eric Joyce is a 78 y.o. year old male with a history of  ITP, splenomegaly, monoclonal lymphocytosis, pulmonary embolism, CAD s/p coronary stents, diabetes with neuropathy, hyperlipidemia, hypertension, who  presents for follow up for below.   1. MDD (major depressive disorder), recurrent, in partial remission (HCC) 2. Anxiety disorder, unspecified type 3. PTSD (post-traumatic stress disorder) Other stressors include: deployment to [place]/combat, loss of a lady a few years ago    History:  dx depression when he was in [place], years ago, was on fluoxetine in the past. Originally on bupropion  150 mg daily (newly started) . Although he did try CPT through [place], he reports limited benefit.  PA does not cover for insurance outside of [place].    Does not he continues to struggle with dreams, he denies any other significant mood symptoms since the previous visit.  Will continue current dose of sertraline  to target depression, PTSD, anxiety, along with bupropion  as an active treatment for depression.  Discussed behavioral activation.  He is interested in reconnection to [employer], and/or attending at the church.    # Alcohol  use disorder He has been abstinent from alcohol  for the past 6 weeks, since taking naltrexone .  He experiences craving for alcohol ; will titrate the dose to optimize treatment for alcohol  abstinence.  Discussed potential risk of nausea, LFT abnormality.    # Insomnia - reportedly had sleep test in 2023 at [facility]. Did not hear the result. He is not interested in CPAP machine.  Overall improving. He reports middle insomnia, which he only attributes to nocturia.  Provided psycho education about sleep hygiene.    Plan  Continue bupropion  150 mg daily (300 mg caused him feeling uncomfortable)- he declined a refill Continue sertraline  100 mg at night Increase naltrexone  50 mg at night  Next appointment: 9/26 at 11 am, video - on gabapentin  300 mg am, 600 mg at night  - on cetrizine for itchiness - on jardiance   Past trials of medication: fluoxetine, bupropion , trazodone (lethargic), Ambien, melatonin, Xanax       The patient demonstrates the following risk factors for suicide: Chronic risk  factors for suicide include: psychiatric disorder of depression . Acute risk factors for suicide include: unemployment. Protective factors for this patient include: coping skills and hope for the future. Considering these factors, the overall suicide risk at this point appears to be low. Patient is appropriate for outpatient  follow up.     Collaboration of Care: Collaboration of Care: {BH OP Collaboration of Care:21014065}  Patient/Guardian was advised Release of Information must be obtained prior to any record release in order to collaborate their care with an outside provider. Patient/Guardian was advised if they have not already done so to contact the registration department to sign all necessary forms in order for us  to release information regarding their care.   Consent: Patient/Guardian gives verbal consent for treatment and assignment of benefits for services provided during this visit. Patient/Guardian expressed understanding and agreed to proceed.    Katheren Sleet, MD 11/25/2023, 9:16 AM

## 2023-11-27 NOTE — Progress Notes (Addendum)
 Virtual Visit via Video Note  I connected with Eric Joyce on 01/16/24 at  3:30 PM EDT by a video enabled telemedicine application and verified that I am speaking with the correct person using two identifiers.  Location: Patient: home Provider: office Persons participated in the visit- patient, provider    I discussed the limitations of evaluation and management by telemedicine and the availability of in person appointments. The patient expressed understanding and agreed to proceed.     I discussed the assessment and treatment plan with the patient. The patient was provided an opportunity to ask questions and all were answered. The patient agreed with the plan and demonstrated an understanding of the instructions.   The patient was advised to call back or seek an in-person evaluation if the symptoms worsen or if the condition fails to improve as anticipated.   Eric Sleet, MD    [facility] MD/PA/NP OP Progress Note  12/02/2023 4:31 PM Eric Joyce  MRN:  [id number]  Chief Complaint:  Chief Complaint  Patient presents with   Follow-up   HPI:  This is a follow-up appointment for depression, anxiety, PTSD and alcohol  use.  He states that he does not feel very good.  He thinks his depression and anxiety has been worse in the last 4 weeks.  He feels trapped and closed in.  He had similar episodes in the past.  He feels like nothing is left, although he does not feel hopeless and denies SI.  He feels that everything is reputation.  He is trying to go to AA meeting as it is like a support group.  He is also concerned about the church.  He sleeps 6 hours.  He has middle insomnia.  He has been able to take care of his dog.  He denies any concern about appetite.  He denies SI, HI, hallucinations.  He reports having crazy dreams, he cannot connect it was the past experience.  While he initially declined to adjust medication as he thinks it will pass, he is willing to try it below after  psychoeducation is provided.   Substance use   Tobacco Alcohol  Other substances/  Current   Sobriety for six weeks, + craving   2-4 cocktails on his birthday, weekend, drinks once a month, denies  Past   Drink heavily, last in 05/09/2014 denies  Past Treatment   (Did not find AA to be helpful)        Household: by himself Marital status:never married Number of children: 87 yo daughter (lives in [place]. His mother is [place]) Employment: retired in 2016, works at [employer]. 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 Education:   He is originally from [place], [place]. He lived in [place] for 11 years because of his daughter. He had a platonic relationship with the mother of his daughter, who is [place].  Visit Diagnosis:    ICD-10-CM   1. MDD (major depressive disorder), recurrent episode, mild  F33.0     2. Anxiety disorder, unspecified type  F41.9     3. PTSD (post-traumatic stress disorder)  F43.10     4. Alcohol  use disorder  F10.90       Past Psychiatric History: Please see initial evaluation for full details. I have reviewed the history. No updates at this time.     Past Medical History:  Past Medical History:  Diagnosis Date   Diabetes mellitus without complication (HCC)    Hyperlipidemia    Hypertension    Myocardial infarction (  HCC)     Past Surgical History:  Procedure Laterality Date   APPENDECTOMY     CARDIAC SURGERY     2 stents   EYE SURGERY     REPLACEMENT TOTAL KNEE Right    TONSILLECTOMY AND ADENOIDECTOMY      Family Psychiatric History: Please see initial evaluation for full details. I have reviewed the history. No updates at this time.     Family History:  Family History  Problem Relation Age of Onset   Lung cancer Mother    Heart disease Father    Diabetes Paternal Uncle    Diabetes Paternal Grandmother     Social History:  Social History   Socioeconomic History   Marital status: Single    Spouse name: Not on file   Number of  children: Not on file   Years of education: Not on file   Highest education level: Not on file  Occupational History   Not on file  Tobacco Use   Smoking status: Former    Types: Cigarettes    Passive exposure: Past   Smokeless tobacco: Never  Vaping Use   Vaping status: Some Days  Substance and Sexual Activity   Alcohol  use: Not Currently   Drug use: Yes    Types: Marijuana   Sexual activity: Yes  Other Topics Concern   Not on file  Social History Narrative   Not on file   Social Drivers of Health   Financial Resource Strain: Low Risk  (01/27/2023)   Received from [facility]   Overall Financial Resource Strain (CARDIA)    Difficulty of Paying Living Expenses: Not hard at all  Food Insecurity: No Food Insecurity (01/27/2023)   Received from [facility]   Hunger Vital Sign    Within the past 12 months, you worried that your food would run out before you got the money to buy more.: Never true    Within the past 12 months, the food you bought just didn't last and you didn't have money to get more.: Never true  Transportation Needs: Unknown (01/27/2023)   Received from [facility]   PRAPARE - Transportation    In the past 12 months, has lack of transportation kept you from medical appointments or from getting medications?: No    Lack of Transportation (Non-Medical): Patient declined  Physical Activity: Not on file  Stress: Not on file  Social Connections: Not on file    Allergies:  Allergies  Allergen Reactions   Sulfamethoxazole-Trimethoprim     Metabolic Disorder Labs: Lab Results  Component Value Date   HGBA1C 6.6 (H) 04/05/2021   MPG 143 04/05/2021   No results found for: PROLACTIN No results found for: CHOL, TRIG, HDL, CHOLHDL, VLDL, LDLCALC Lab Results  Component Value Date   TSH 0.952 04/04/2021    Therapeutic Level Labs: No results found for: LITHIUM No results found for:  VALPROATE No results found for: CBMZ  Current Medications: Current Outpatient Medications  Medication Sig Dispense Refill   aspirin  81 MG chewable tablet Chew 81 mg by mouth daily.     atorvastatin (LIPITOR) 40 MG tablet Take 40 mg by mouth daily.     buPROPion  (WELLBUTRIN  XL) 150 MG 24 hr tablet Take 150 mg by mouth daily.     cetirizine (ZYRTEC) 10 MG tablet Take 10 mg by mouth daily.     Coenzyme Q10 100 MG capsule Take 100 mg by mouth daily.  empagliflozin (JARDIANCE) 25 MG TABS tablet Take 25 mg by mouth daily.     gabapentin  (NEURONTIN ) 300 MG capsule Take by mouth. TAKE TWO CAPSULES BY MOUTH IN THE MORNING AND TAKE ONE CAPSULE AT BEDTIME FOR NEUROPATHIC PAIN     ketoconazole (NIZORAL) 2 % shampoo Apply topically.     lisinopril (ZESTRIL) 10 MG tablet Take 1 tablet by mouth daily.     metFORMIN (GLUCOPHAGE) 500 MG tablet Take 500 mg by mouth 2 (two) times daily with a meal.     metoprolol  succinate (TOPROL -XL) 25 MG 24 hr tablet Take 25 mg by mouth daily.     Multiple Vitamins-Minerals (MULTIVITAMIN WITH MINERALS) tablet Take 1 tablet by mouth daily.     naltrexone  (DEPADE) 50 MG tablet Take 1 tablet (50 mg total) by mouth daily. 90 tablet 0   sertraline  (ZOLOFT ) 100 MG tablet Take 1.5 tablets (150 mg total) by mouth at bedtime. 135 tablet 0   Vibegron  (GEMTESA ) 75 MG TABS Take 1 tablet (75 mg total) by mouth daily. 90 tablet 3   No current facility-administered medications for this visit.     Musculoskeletal: Strength & Muscle Tone: N/A Gait & Station: N/A Patient leans: N/A  Psychiatric Specialty Exam: Review of Systems  Psychiatric/Behavioral:  Positive for dysphoric mood and sleep disturbance. Negative for agitation, behavioral problems, confusion, decreased concentration, hallucinations, self-injury and suicidal ideas. The patient is nervous/anxious. The patient is not hyperactive.   All other systems reviewed and are negative.   There were no vitals taken for  this visit.There is no height or weight on file to calculate BMI.  General Appearance: Well Groomed  Eye Contact:  Good  Speech:  Clear and Coherent  Volume:  Normal  Mood:  not good  Affect:  Appropriate, Congruent, and slightly down  Thought Process:  Coherent  Orientation:  Full (Time, Place, and Person)  Thought Content: Logical   Suicidal Thoughts:  No  Homicidal Thoughts:  No  Memory:  Immediate;   Good  Judgement:  Good  Insight:  Good  Psychomotor Activity:  Normal  Concentration:  Concentration: Good and Attention Span: Good  Recall:  Good  Fund of Knowledge: Good  Language: Good  Akathisia:  No  Handed:  Right  AIMS (if indicated): not done  Assets:  Communication Skills Desire for Improvement  ADL's:  Intact  Cognition: WNL  Sleep:  Poor   Screenings: GAD-7    Loss Adjuster, Chartered Office Visit from 01/21/2023 in [facility] Office Visit from 12/02/2022 in [facility] Office Visit from 04/18/2022 in [facility] Counselor from 11/08/2021 in [facility]  Total GAD-7 Score 2 3 2 4    PHQ2-9    Flowsheet Row Office Visit from 01/21/2023 in [facility] Office Visit from 12/02/2022 in [facility] Office Visit from 04/18/2022 in [facility] Office Visit from 02/19/2022 in [facility] Counselor from 11/08/2021 in [facility] at [facility] Total Score 3 1 2 1  0  PHQ-9 Total Score 6 -- 5 7 --   Flowsheet Row Office Visit from 01/21/2023 in [facility] Office Visit from 12/02/2022 in [facility] Counselor from 11/08/2021 in [facility] at [facility] RISK CATEGORY No Risk No Risk  No Risk     Assessment and Plan:  Alexxander Kurt Razzano is a 78 y.o. year old male with a history of  ITP, splenomegaly, monoclonal lymphocytosis, pulmonary embolism, CAD s/p coronary stents, diabetes with neuropathy, hyperlipidemia, hypertension, who presents for follow up for below.  1. MDD (major depressive disorder), recurrent, mild 2. Anxiety disorder, unspecified type 3. PTSD (post-traumatic stress disorder) Other stressors include: deployment to [place]/combat, loss of a lady a few years ago    History:  dx depression when he was in [place], years ago, was on fluoxetine in the past. Originally on bupropion  150 mg daily (newly started) . Although he did try CPT through [place], he reports limited benefit.  PA does not cover for insurance outside of [place].   He reports worsening in depressive symptoms, and bizzare dream (not directly associated with his combat experience) in the last month without known triggers.  He denies any seasonal patterns, although he reports experiencing the similar episodes in the past.  Will uptitrate sertraline  to optimize treatment for depression, PTSD, anxiety.  Discussed potential risk of drowsiness and nausea.  Will continue bupropion  as adjunctive treatment for depression.  Discussed behavioral activation.  He is considering to reconnect with AA meeting and attending the church.   4. Alcohol  use disorder He denies craving for alcohol , and has been abstinent from alcohol .  While he may benefit from uptitration of naltrexone  in the future, will prioritize the intervention as outlined above. Discussed potential risk of nausea, LFT abnormality.     # Insomnia - reportedly had sleep test in 2023 at [facility]. Did not hear the result. He is not interested in CPAP machine.  Slightly worsening.  Will intervene his mood as outlined above.  Provided psycho education about sleep hygiene.    Plan  Continue bupropion  150  mg daily (300 mg caused him feeling uncomfortable)- he declined a refill Increase sertraline  150 mg at night Continue naltrexone  50 mg at night  Next appointment: 9/26 at 11 am, video - on gabapentin  300 mg am, 600 mg at night  - on cetrizine for itchiness - on jardiance   Past trials of medication: fluoxetine, bupropion , trazodone (lethargic), Ambien, melatonin, Xanax       The patient demonstrates the following risk factors for suicide: Chronic risk factors for suicide include: psychiatric disorder of depression . Acute risk factors for suicide include: unemployment. Protective factors for this patient include: coping skills and hope for the future. Considering these factors, the overall suicide risk at this point appears to be low. Patient is appropriate for outpatient follow up.     Collaboration of Care: Collaboration of Care: Other reviewed notes in Epic  Patient/Guardian was advised Release of Information must be obtained prior to any record release in order to collaborate their care with an outside provider. Patient/Guardian was advised if they have not already done so to contact the registration department to sign all necessary forms in order for us  to release information regarding their care.   Consent: Patient/Guardian gives verbal consent for treatment and assignment of benefits for services provided during this visit. Patient/Guardian expressed understanding and agreed to proceed.    Eric Sleet, MD 12/02/2023, 4:31 PM

## 2023-11-28 ENCOUNTER — Telehealth: Admitting: Psychiatry

## 2023-12-02 ENCOUNTER — Encounter: Payer: Self-pay | Admitting: Psychiatry

## 2023-12-02 ENCOUNTER — Telehealth (INDEPENDENT_AMBULATORY_CARE_PROVIDER_SITE_OTHER): Admitting: Psychiatry

## 2023-12-02 DIAGNOSIS — F330 Major depressive disorder, recurrent, mild: Secondary | ICD-10-CM | POA: Diagnosis not present

## 2023-12-02 DIAGNOSIS — F1090 Alcohol use, unspecified, uncomplicated: Secondary | ICD-10-CM | POA: Diagnosis not present

## 2023-12-02 DIAGNOSIS — F4310 Post-traumatic stress disorder, unspecified: Secondary | ICD-10-CM

## 2023-12-02 DIAGNOSIS — F419 Anxiety disorder, unspecified: Secondary | ICD-10-CM | POA: Diagnosis not present

## 2023-12-02 MED ORDER — SERTRALINE HCL 100 MG PO TABS: 150.0000 mg | ORAL_TABLET | Freq: Every day | ORAL | 0 refills | Status: AC

## 2023-12-02 MED ORDER — NALTREXONE HCL 50 MG PO TABS: 50.0000 mg | ORAL_TABLET | Freq: Every day | ORAL | 0 refills | Status: AC

## 2023-12-10 DIAGNOSIS — I214 Non-ST elevation (NSTEMI) myocardial infarction: Secondary | ICD-10-CM | POA: Diagnosis not present

## 2023-12-10 DIAGNOSIS — I2510 Atherosclerotic heart disease of native coronary artery without angina pectoris: Secondary | ICD-10-CM | POA: Diagnosis not present

## 2023-12-16 DIAGNOSIS — I214 Non-ST elevation (NSTEMI) myocardial infarction: Secondary | ICD-10-CM | POA: Diagnosis not present

## 2023-12-16 DIAGNOSIS — Z9889 Other specified postprocedural states: Secondary | ICD-10-CM | POA: Diagnosis not present

## 2023-12-16 DIAGNOSIS — G4733 Obstructive sleep apnea (adult) (pediatric): Secondary | ICD-10-CM | POA: Diagnosis not present

## 2023-12-16 DIAGNOSIS — I8390 Asymptomatic varicose veins of unspecified lower extremity: Secondary | ICD-10-CM | POA: Diagnosis not present

## 2023-12-16 DIAGNOSIS — F1020 Alcohol dependence, uncomplicated: Secondary | ICD-10-CM | POA: Diagnosis not present

## 2023-12-16 DIAGNOSIS — R0602 Shortness of breath: Secondary | ICD-10-CM | POA: Diagnosis not present

## 2023-12-16 DIAGNOSIS — I2699 Other pulmonary embolism without acute cor pulmonale: Secondary | ICD-10-CM | POA: Diagnosis not present

## 2023-12-16 DIAGNOSIS — Z86711 Personal history of pulmonary embolism: Secondary | ICD-10-CM | POA: Diagnosis not present

## 2023-12-16 DIAGNOSIS — I2510 Atherosclerotic heart disease of native coronary artery without angina pectoris: Secondary | ICD-10-CM | POA: Diagnosis not present

## 2023-12-16 DIAGNOSIS — E119 Type 2 diabetes mellitus without complications: Secondary | ICD-10-CM | POA: Diagnosis not present

## 2023-12-16 DIAGNOSIS — I10 Essential (primary) hypertension: Secondary | ICD-10-CM | POA: Diagnosis not present

## 2023-12-16 DIAGNOSIS — E785 Hyperlipidemia, unspecified: Secondary | ICD-10-CM | POA: Diagnosis not present

## 2023-12-16 IMAGING — CR DG CHEST 2V
1 series · 2 of 2 positions shown · non-contrast
Comparison: None.

CLINICAL DATA: Generalized weakness, fever and chills for more than
3 weeks. Black stool and copious diarrhea.

EXAM:
CHEST - 2 VIEW

[Series 1: dg chest 2 view · 0.14mm/px · 2 of 2 slices shown]
[im 1/2]
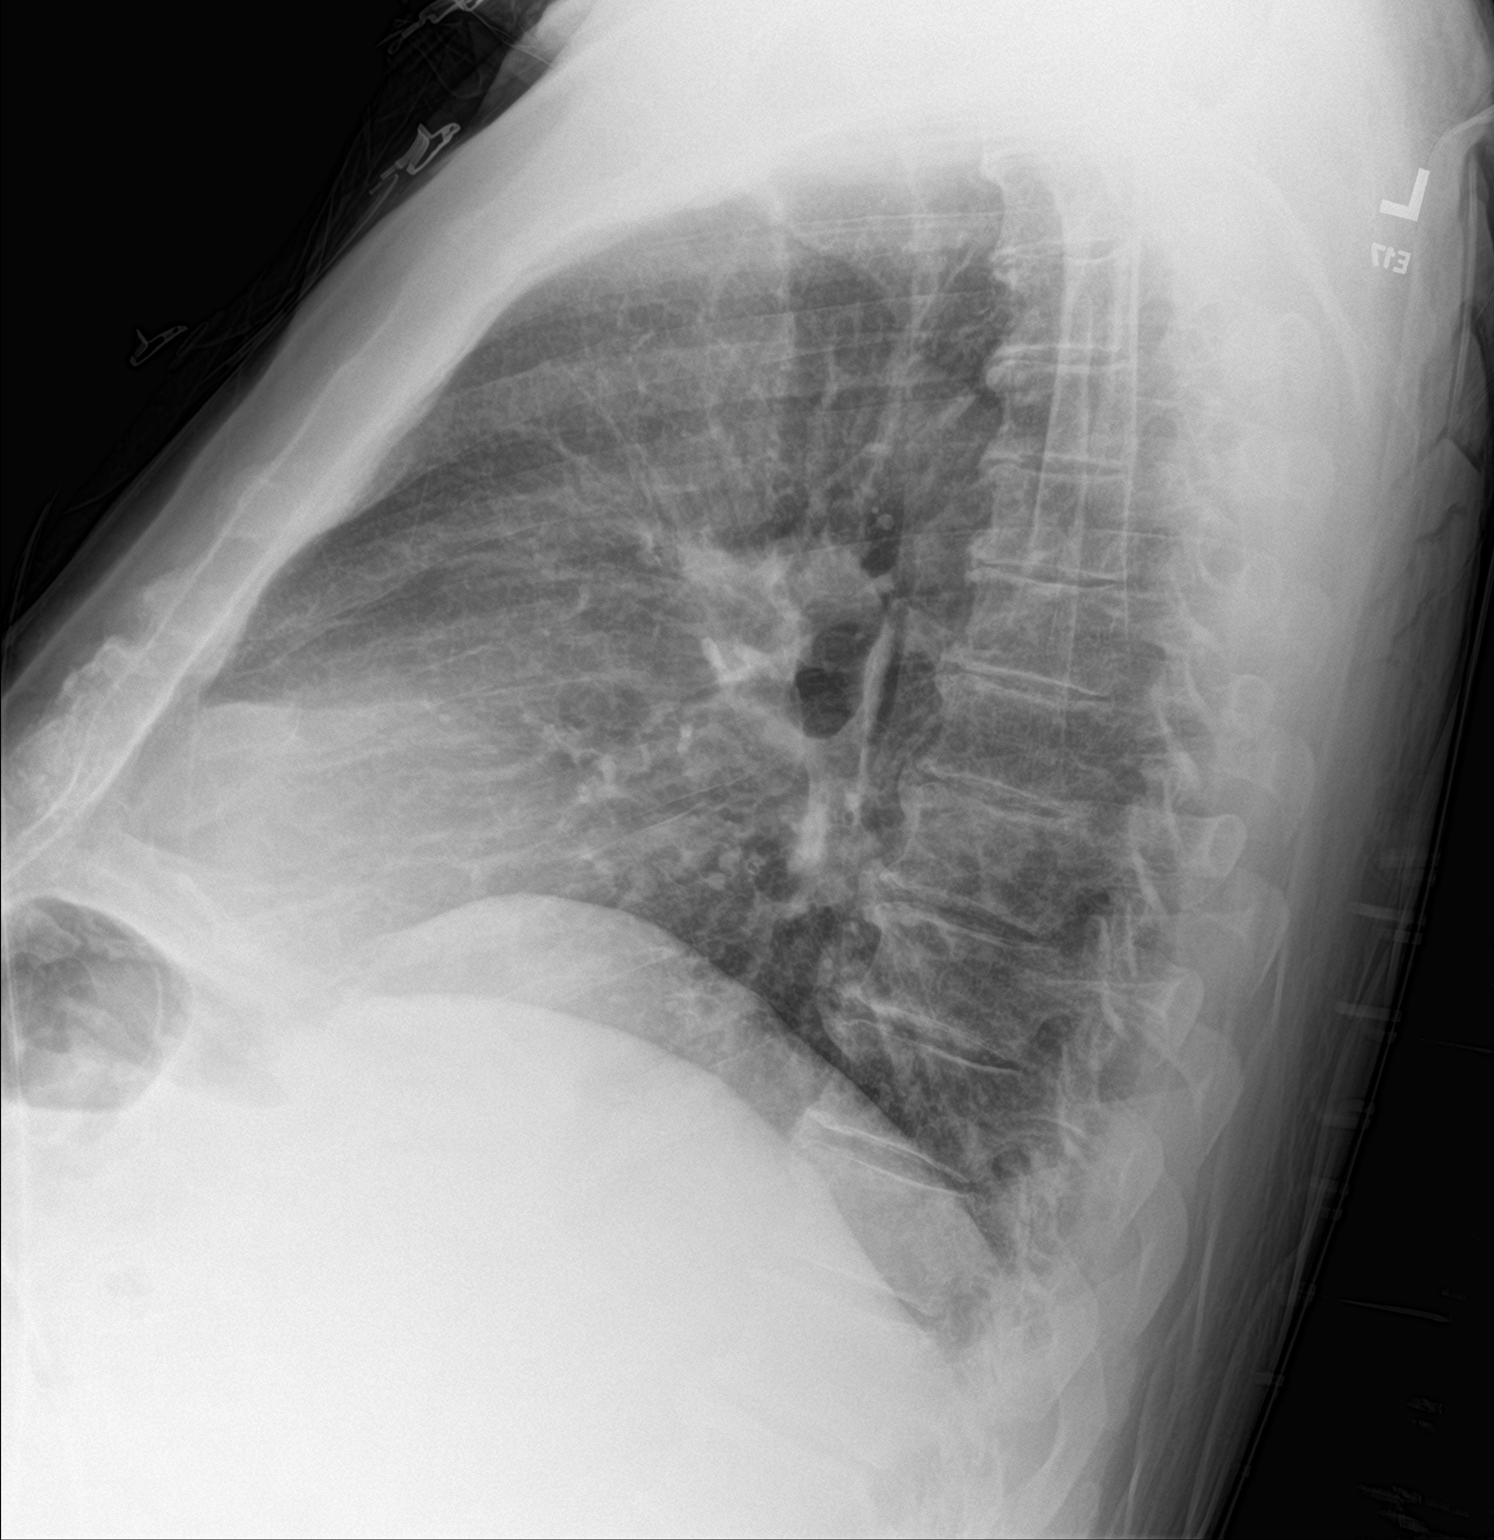
[im 2/2]
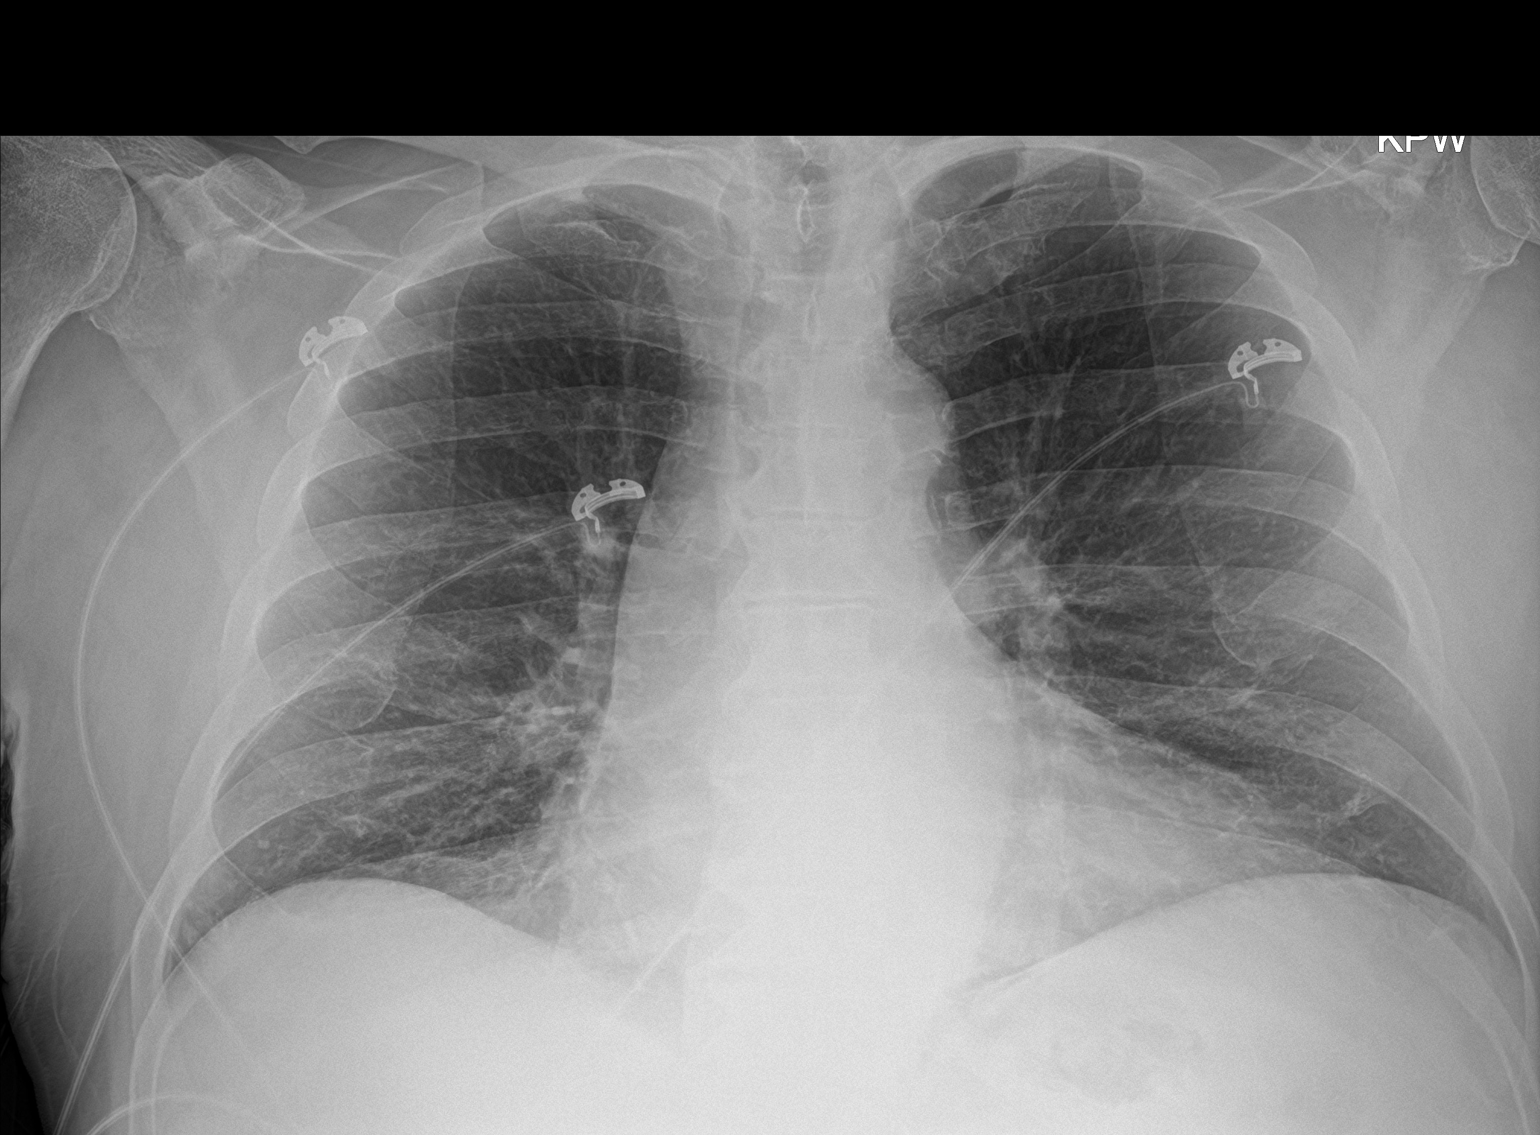

[2 of 2 positions shown; findings below may reference images not displayed]

FINDINGS: Normal sized heart. Tortuous aorta. Clear lungs. Thoracic spine
degenerative changes.
IMPRESSION: No acute abnormality.

## 2023-12-18 IMAGING — US US EXTREM LOW VENOUS
1 series · 13 of 24 positions shown · non-contrast
Comparison: None.

CLINICAL DATA: 75-year-old male with history of pulmonary embolism.



[Series 1: us venous img lower bilat (dvt) · portal-venous · 13 of 68 slices shown]
[im 1/68]
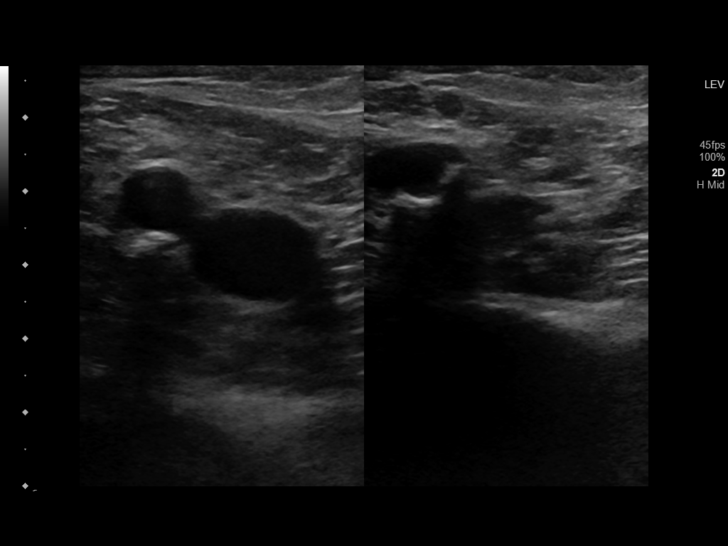
[im 6/68]
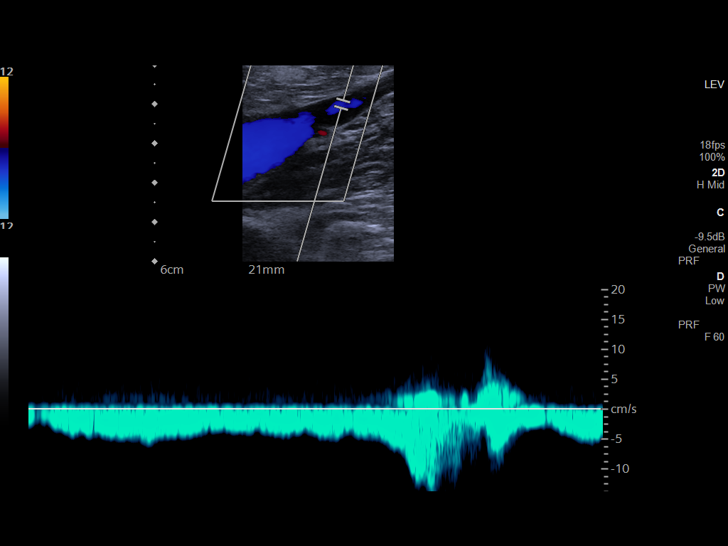
[im 12/68]
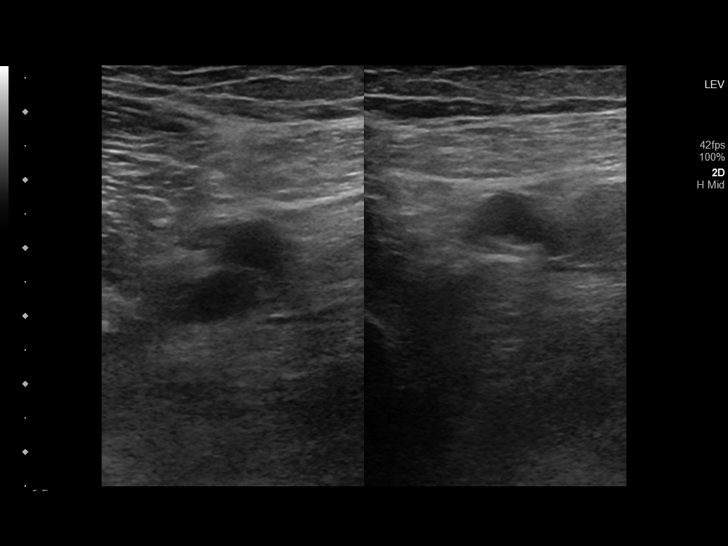
[im 18/68]
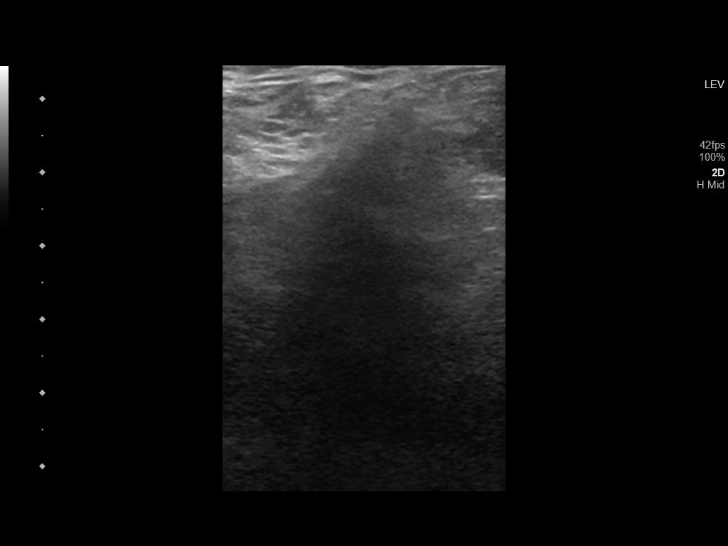
[im 24/68]
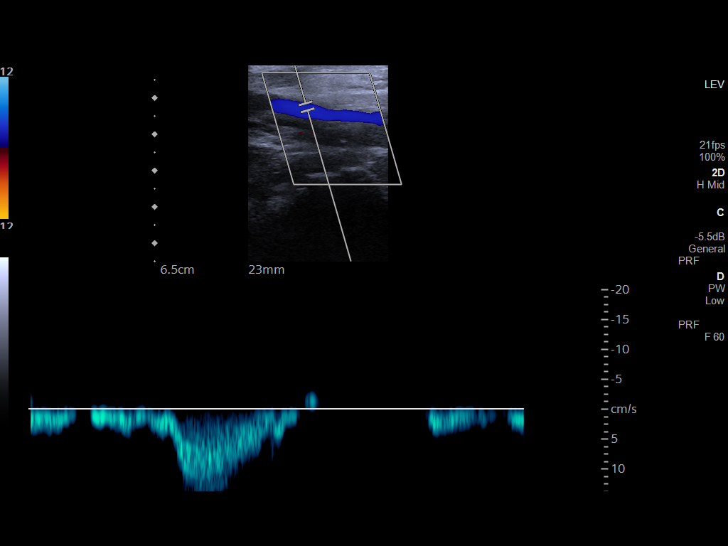
[im 30/68]
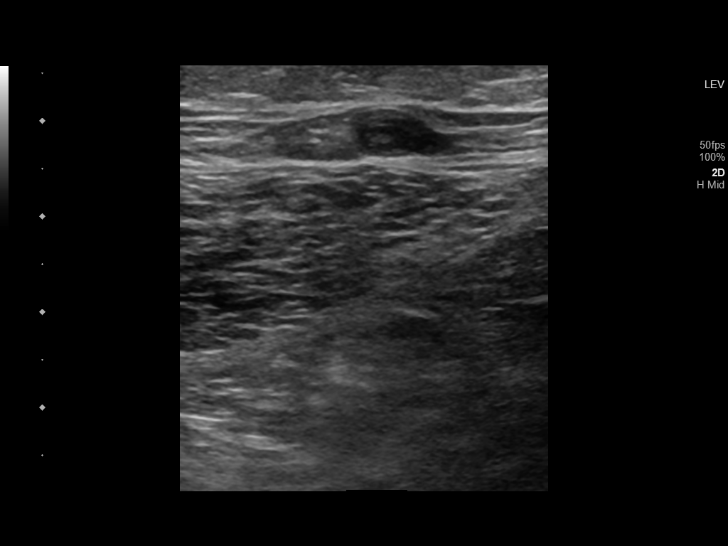
[im 35/68]
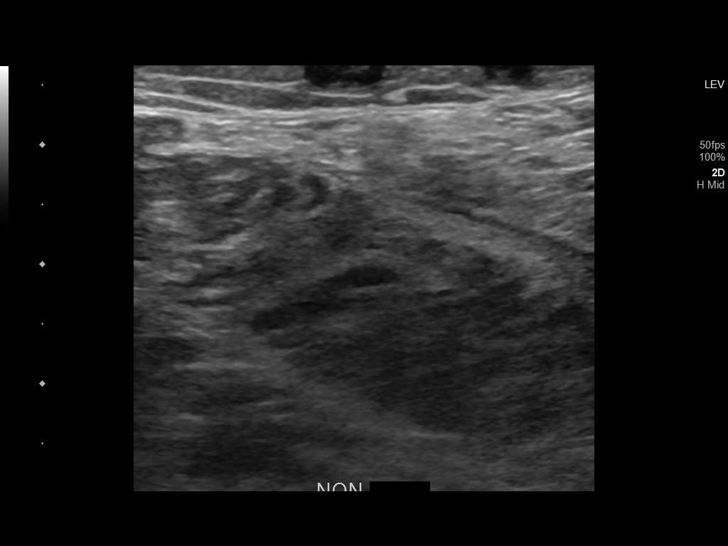
[im 38/68]
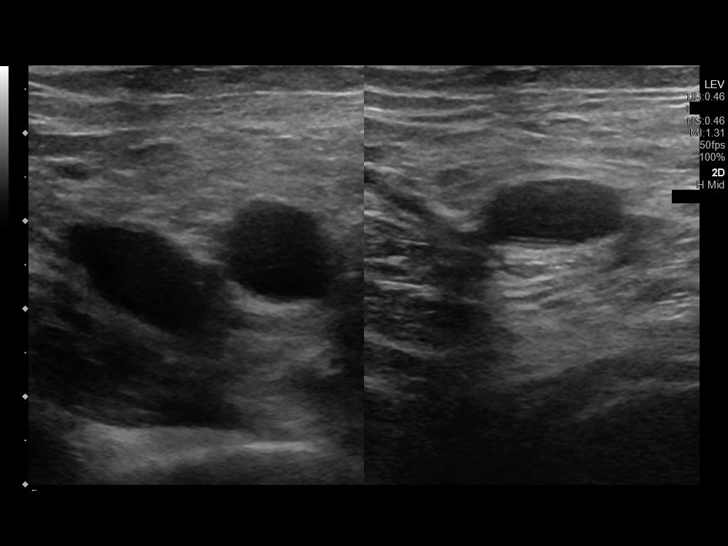
[im 44/68]
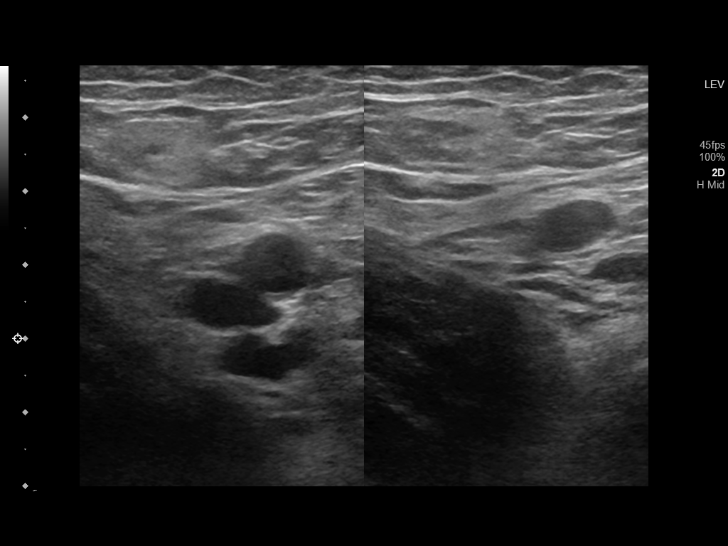
[im 50/68]
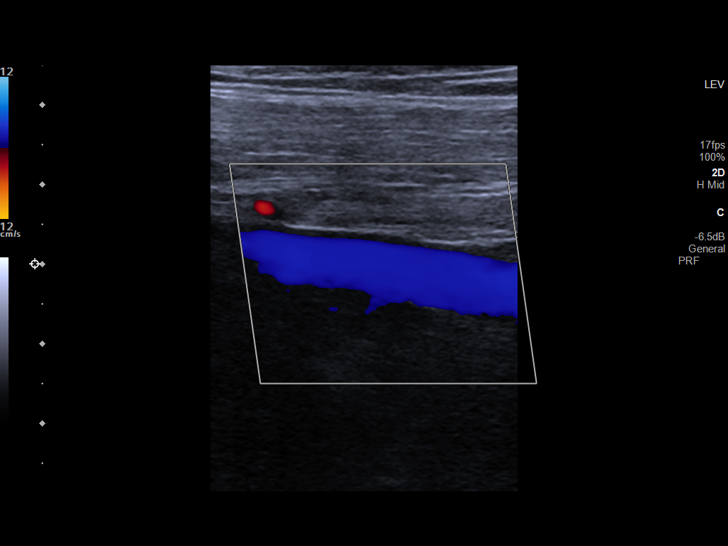
[im 56/68]
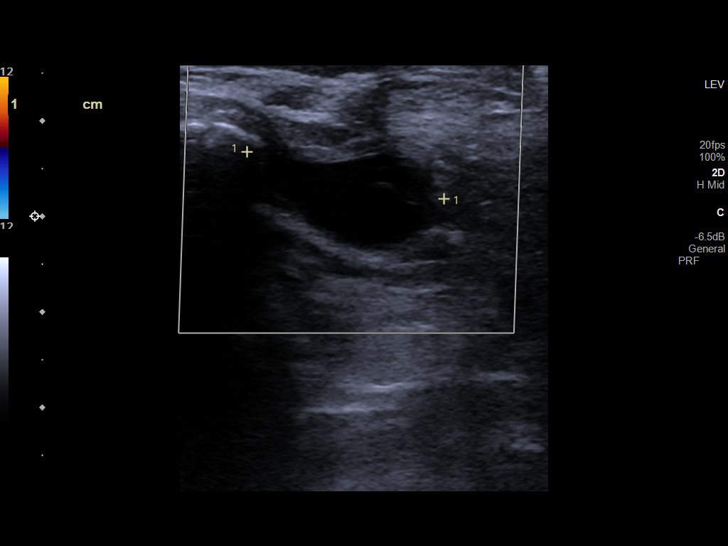
[im 62/68]
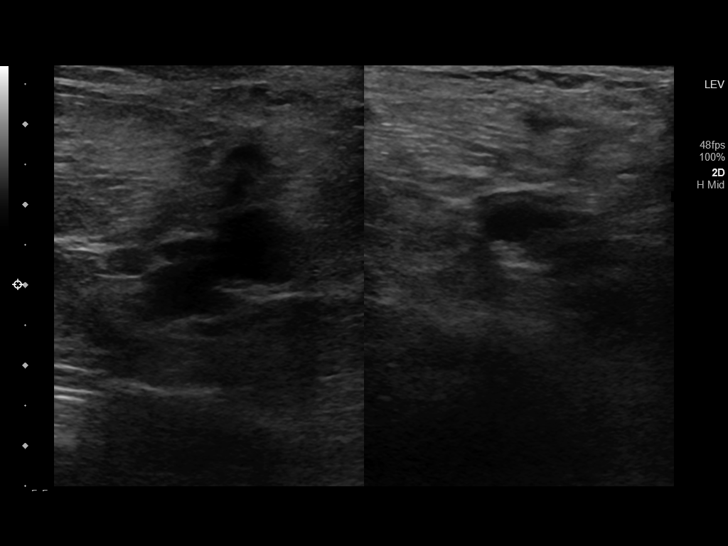
[im 68/68]
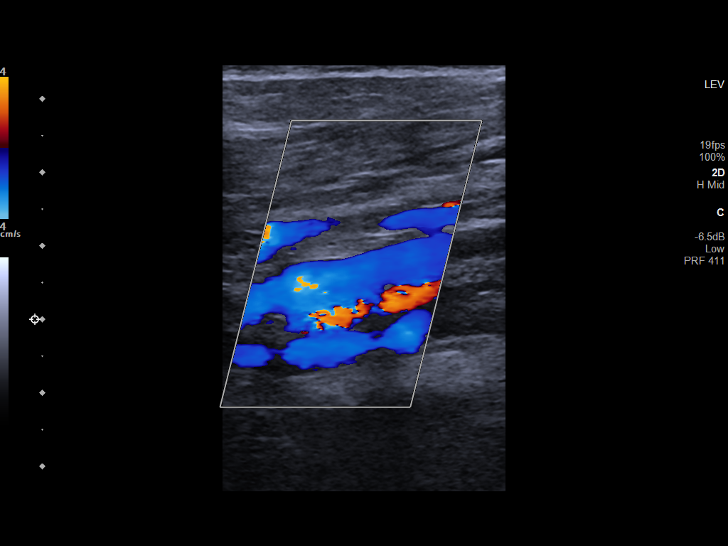

[13 of 24 positions shown; findings below may reference images not displayed]

FINDINGS: RIGHT LOWER EXTREMITY

Common Femoral Vein: No evidence of thrombus. Normal
compressibility, respiratory phasicity and response to augmentation.

Saphenofemoral Junction: No evidence of thrombus. Normal
compressibility and flow on color Doppler imaging.

Profunda Femoral Vein: No evidence of thrombus. Normal
compressibility and flow on color Doppler imaging.

Femoral Vein: No evidence of thrombus. Normal compressibility,
respiratory phasicity and response to augmentation.

Popliteal Vein: No evidence of thrombus. Normal compressibility,
respiratory phasicity and response to augmentation.

Calf Veins: No evidence of thrombus. Normal compressibility and flow
on color Doppler imaging.

Superficial Great Saphenous Vein: Occlusive thrombus throughout the
thigh and the calf to approximately 2.7 cm peripheral to the
saphenofemoral junction.

Other Findings:  None.

LEFT LOWER EXTREMITY

Common Femoral Vein: No evidence of thrombus. Normal
compressibility, respiratory phasicity and response to augmentation.

Saphenofemoral Junction: No evidence of thrombus. Normal
compressibility and flow on color Doppler imaging.

Profunda Femoral Vein: No evidence of thrombus. Normal
compressibility and flow on color Doppler imaging.

Femoral Vein: No evidence of thrombus. Normal compressibility,
respiratory phasicity and response to augmentation.

Popliteal Vein: No evidence of thrombus. Normal compressibility,
respiratory phasicity and response to augmentation.

Calf Veins: No evidence of thrombus. Normal compressibility and flow
on color Doppler imaging.

Superficial Great Saphenous Vein: No evidence of thrombus. Normal
compressibility.

Other Findings:  None.
IMPRESSION: 1. Superficial venous thrombosis of the right greater saphenous vein
throughout the calf and thigh. No evidence of suppurative
thrombophlebitis.
2. No evidence of bilateral lower extremity deep vein thrombosis.

## 2024-01-21 DIAGNOSIS — L97521 Non-pressure chronic ulcer of other part of left foot limited to breakdown of skin: Secondary | ICD-10-CM | POA: Diagnosis not present

## 2024-01-21 DIAGNOSIS — E1142 Type 2 diabetes mellitus with diabetic polyneuropathy: Secondary | ICD-10-CM | POA: Diagnosis not present

## 2024-02-01 NOTE — Progress Notes (Deleted)
 BH MD/PA/NP OP Progress Note  02/01/2024 9:40 AM Eric Joyce  MRN:  [id number]  Chief Complaint: No chief complaint on file.  HPI: ***   Substance use   Tobacco Alcohol  Other substances/  Current   Sobriety for six weeks, + craving   2-4 cocktails on his birthday, weekend, drinks once a month, denies  Past   Drink heavily, last in 05/09/2014 denies  Past Treatment   (Did not find AA to be helpful)        Household: by himself Marital status:never married Number of children: 77 yo daughter (lives in [place]. His mother is [place]) Employment: retired in 2016, works at [employer]. 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 Education:   He is originally from [place], [place]. He lived in [place] for 11 years because of his daughter. He had a platonic relationship with the mother of his daughter, who is [place].  Visit Diagnosis: No diagnosis found.  Past Psychiatric History: Please see initial evaluation for full details. I have reviewed the history. No updates at this time.     Past Medical History:  Past Medical History:  Diagnosis Date   Diabetes mellitus without complication (HCC)    Hyperlipidemia    Hypertension    Myocardial infarction [facility])     Past Surgical History:  Procedure Laterality Date   APPENDECTOMY     CARDIAC SURGERY     2 stents   EYE SURGERY     REPLACEMENT TOTAL KNEE Right    TONSILLECTOMY AND ADENOIDECTOMY      Family Psychiatric History: Please see initial evaluation for full details. I have reviewed the history. No updates at this time.    Family History:  Family History  Problem Relation Age of Onset   Lung cancer Mother    Heart disease Father    Diabetes Paternal Uncle    Diabetes Paternal Grandmother     Social History:  Social History   Socioeconomic History   Marital status: Single    Spouse name: Not on file   Number of children: Not on file   Years of education: Not on file   Highest education level: Not on file   Occupational History   Not on file  Tobacco Use   Smoking status: Former    Types: Cigarettes    Passive exposure: Past   Smokeless tobacco: Never  Vaping Use   Vaping status: Some Days  Substance and Sexual Activity   Alcohol  use: Not Currently   Drug use: Yes    Types: Marijuana   Sexual activity: Yes  Other Topics Concern   Not on file  Social History Narrative   Not on file   Social Drivers of Health   Financial Resource Strain: Low Risk  (01/27/2023)   Received from [facility]   Overall Financial Resource Strain (CARDIA)    Difficulty of Paying Living Expenses: Not hard at all  Food Insecurity: No Food Insecurity (01/27/2023)   [facility]   Hunger Vital Sign    Within the past 12 months, you worried that your food would run out before you got the money to buy more.: Never true    Within the past 12 months, the food you bought just didn't last and you didn't have money to get more.: Never true  Transportation Needs: Unknown (01/27/2023)   Received from [facility]    In the past 12 months, has  lack of transportation kept you from medical appointments or from getting medications?: No    Lack of Transportation (Non-Medical): Patient declined  Physical Activity: Not on file  Stress: Not on file  Social Connections: Not on file    Allergies:  Allergies  Allergen Reactions   Sulfamethoxazole-Trimethoprim     Metabolic Disorder Labs: Lab Results  Component Value Date   HGBA1C 6.6 (H) 04/05/2021   MPG 143 04/05/2021   No results found for: PROLACTIN No results found for: CHOL, TRIG, HDL, CHOLHDL, VLDL, LDLCALC Lab Results  Component Value Date   TSH 0.952 04/04/2021    Therapeutic Level Labs: No results found for: LITHIUM No results found for: VALPROATE No results found for: CBMZ  Current Medications: Current Outpatient Medications   Medication Sig Dispense Refill   aspirin  81 MG chewable tablet Chew 81 mg by mouth daily.     atorvastatin (LIPITOR) 40 MG tablet Take 40 mg by mouth daily.     buPROPion  (WELLBUTRIN  XL) 150 MG 24 hr tablet Take 150 mg by mouth daily.     cetirizine (ZYRTEC) 10 MG tablet Take 10 mg by mouth daily.     Coenzyme Q10 100 MG capsule Take 100 mg by mouth daily.     empagliflozin (JARDIANCE) 25 MG TABS tablet Take 25 mg by mouth daily.     gabapentin  (NEURONTIN ) 300 MG capsule Take by mouth. TAKE TWO CAPSULES BY MOUTH IN THE MORNING AND TAKE ONE CAPSULE AT BEDTIME FOR NEUROPATHIC PAIN     ketoconazole (NIZORAL) 2 % shampoo Apply topically.     lisinopril (ZESTRIL) 10 MG tablet Take 1 tablet by mouth daily.     metFORMIN (GLUCOPHAGE) 500 MG tablet Take 500 mg by mouth 2 (two) times daily with a meal.     metoprolol  succinate (TOPROL -XL) 25 MG 24 hr tablet Take 25 mg by mouth daily.     Multiple Vitamins-Minerals (MULTIVITAMIN WITH MINERALS) tablet Take 1 tablet by mouth daily.     naltrexone  (DEPADE) 50 MG tablet Take 1 tablet (50 mg total) by mouth daily. 90 tablet 0   sertraline  (ZOLOFT ) 100 MG tablet Take 1.5 tablets (150 mg total) by mouth at bedtime. 135 tablet 0   Vibegron  (GEMTESA ) 75 MG TABS Take 1 tablet (75 mg total) by mouth daily. 90 tablet 3   No current facility-administered medications for this visit.     Musculoskeletal: Strength & Muscle Tone: N/A Gait & Station: N/A Patient leans: N/A  Psychiatric Specialty Exam: Review of Systems  There were no vitals taken for this visit.There is no height or weight on file to calculate BMI.  General Appearance: {Appearance:22683}  Eye Contact:  {BHH EYE CONTACT:22684}  Speech:  Clear and Coherent  Volume:  Normal  Mood:  {BHH MOOD:22306}  Affect:  {Affect (PAA):22687}  Thought Process:  Coherent  Orientation:  Full (Time, Place, and Person)  Thought Content: Logical   Suicidal Thoughts:  {ST/HT (PAA):22692}  Homicidal Thoughts:   {ST/HT (PAA):22692}  Memory:  Immediate;   Good  Judgement:  {Judgement (PAA):22694}  Insight:  {Insight (PAA):22695}  Psychomotor Activity:  Normal  Concentration:  Concentration: Good and Attention Span: Good  Recall:  Good  Fund of Knowledge: Good  Language: Good  Akathisia:  No  Handed:  Right  AIMS (if indicated): not done  Assets:  Communication Skills Desire for Improvement  ADL's:  Intact  Cognition: WNL  Sleep:  {BHH GOOD/FAIR/POOR:22877}   Screenings: GAD-7    Garment/textile Technologist Visit  from 01/21/2023 in [facility] Office Visit from 12/02/2022 in [facility] Office Visit from 04/18/2022 in [facility] Counselor from 11/08/2021 in [facility] at [facility]  Total GAD-7 Score 2 3 2 4    PHQ2-9    Flowsheet Row Office Visit from 01/21/2023 in [facility] Office Visit from 12/02/2022 in [facility] Associates Office Visit from 04/18/2022 in [facility] Office Visit from 02/19/2022 in [facility] Counselor from 11/08/2021 in [facility] at [facility] Total Score 3 1 2 1  0  PHQ-9 Total Score 6 -- 5 7 --   Flowsheet Row Office Visit from 01/21/2023 in [facility] Associates Office Visit from 12/02/2022 in [facility]iatric Associates Counselor from 11/08/2021 in [facility] at [facility] RISK CATEGORY No Risk No Risk No Risk     Assessment and Plan:  Goran Olden Aikens is a 78 year old male with a history of  ITP, splenomegaly, monoclonal lymphocytosis, pulmonary embolism, CAD s/p coronary stents, diabetes with neuropathy, hyperlipidemia, hypertension, who presents  for follow up for below.   1. MDD (major depressive disorder), recurrent, mild 2. Anxiety disorder, unspecified type 3. PTSD (post-traumatic stress disorder) Other stressors include: deployment to [place]/combat, loss of a lady a few years ago    History:  dx depression when he was in [place], years ago, was on fluoxetine in the past. Originally on bupropion  150 mg daily (newly started) . Although he did try CPT through [facility], he reports limited benefit.  PA does not cover for insurance outside of [facility].   He reports worsening in depressive symptoms, and bizzare dream (not directly associated with his combat experience) in the last month without known triggers.  He denies any seasonal patterns, although he reports experiencing the similar episodes in the past.  Will uptitrate sertraline  to optimize treatment for depression, PTSD, anxiety.  Discussed potential risk of drowsiness and nausea.  Will continue bupropion  as adjunctive treatment for depression.  Discussed behavioral activation.  He is considering to reconnect with AA meeting and attending the church.    4. Alcohol  use disorder He denies craving for alcohol , and has been abstinent from alcohol .  While he may benefit from uptitration of naltrexone  in the future, will prioritize the intervention as outlined above. Discussed potential risk of nausea, LFT abnormality.     # Insomnia - reportedly had sleep test in 2023 at [facility]. Did not hear the result. He is not interested in CPAP machine.  Slightly worsening.  Will intervene his mood as outlined above.  Provided psycho education about sleep hygiene.    Plan  Continue bupropion  150 mg daily (300 mg caused him feeling uncomfortable)- he declined a refill Increase sertraline  150 mg at night Continue naltrexone  50 mg at night  Next appointment: 9/26 at 11 am, video - on gabapentin  300 mg am, 600 mg at night  - on cetrizine for itchiness - on jardiance     Past trials of medication: fluoxetine,  bupropion , trazodone (lethargic), Ambien, melatonin, Xanax       The patient demonstrates the following risk factors for suicide: Chronic risk factors for suicide include: psychiatric disorder of depression . Acute risk factors for suicide include: unemployment. Protective factors for this patient include: coping skills and hope for the  future. Considering these factors, the overall suicide risk at this point appears to be low. Patient is appropriate for outpatient follow up.     Collaboration of Care: Collaboration of Care: {BH OP Collaboration of Care:21014065}  Patient/Guardian was advised Release of Information must be obtained prior to any record release in order to collaborate their care with an outside provider. Patient/Guardian was advised if they have not already done so to contact the registration department to sign all necessary forms in order for us  to release information regarding their care.   Consent: Patient/Guardian gives verbal consent for treatment and assignment of benefits for services provided during this visit. Patient/Guardian expressed understanding and agreed to proceed.    Katheren Sleet, MD 02/01/2024, 9:40 AM

## 2024-02-06 ENCOUNTER — Telehealth: Admitting: Psychiatry

## 2024-03-09 NOTE — Progress Notes (Signed)
 " Chief Complaint:   Chief Complaint  Patient presents with   Annual Exam    AWV/CPE    Subjective:  Eric Joyce is a 79 y.o. male in today for recheck and Annual Wellness Visit.  He sees a PCP at the [facility] and sees Cardiology, Psychiatry, Heme/Onc, Optometry, Podiatry and Urology locally. 1.  Diabetes.  Is on Metformin twice daily; was cut back to 500 mg daily at the [facility].  Blood sugars usually are well controlled.  HgA1c was 6.5 this year at the [facility], which has been stable.  Microalbuminuria was negative.  No increased thirst or urination.   2.  History of alcohol  abuse/PTSD. Has a history of heavy drinking off and on, quit in 2017. He is on Bupropion , Naltrexone  and Zoloft  daily; depression is well controlled.  He feels anxious occasionally but generally is controlled. Has had trouble sleeping at times, which has been an issue for years.  Is being followed by Dr Vickey, Psychiatry. 3.  Hyperlipidemia. Is on Atorvastatin 40 mg a day. Lipids checked at the [facility] have been well controlled, with total cholesterol <100 and HDL higher than LDL.  No myalgias, muscle weakness, nausea or abdominal pain.  Liver enzymes were normal when last checked. 4.  CAD.  Is on aspirin  for prophylaxis.  Is on Metoprolol , Jardiance and Lisinopril daily.  Blood pressures have been well controlled. No anginal symptoms.  Is walking about 2 miles most days of the week.  Had a stress test in Oct 2025 that was normal.  Is followed by Dr Ammon. 5.  Peripheral neuropathy. He has had numbness, stiffness and occasional zapping sensations in his feet bilaterally.  No real pain. Is on gabapentin  twice a day and symptoms are usually well controlled.   6.  BPH.  Had Uro-lift by Dr Kassie in 2020, which was of no benefit.  Is now followed by Dr Francisca, Urology.  He stopped Tamsulosin and Oxybutynin , due to being ineffective.  Is on Myrbetriq currently, which works fairly well.   7.  Health maintenance issues.  PSA reportedly was normal  this year; was 1.7 on 04/02/22.  Urinary issues as above.  He had a colonoscopy in 2021.  In the past week, notes BM's are watery; has 3-4 per day.  No blood in the stool or melena.  He is up to date with immunizations.  He stays physically active, walks 30 min most days; was seeing a trainer twice a week but paused that over the holidays.  He has a remote history of smoking and a history of alcohol  use as noted above.  Family history is  for positive for prostate cancer and coronary artery disease, negative for stroke, hypertension, diabetes, and colon cancer. 8.  AWV.  Reviewed all issues and form scanned in. Urinary symptoms as above.  He has no limitations on ADL's, no sign of depression or gait instability.  Has mild hearing loss, not using hearing aids.  He has advance directives; his daughter is his health care POA.  ROS:  Per HPI.  Had an episode of very low platelets; platelets have been normal in recent years.  Sees Dr Babara, Heme/Onc yearly.  Has varicose veins.  Had U/S by Vascular in the past that was normal.  Past Medical History:  Diagnosis Date   Alcoholism (CMS/HHS-HCC)    Basal cell carcinoma    CAD (coronary artery disease)    Depression    Diabetes mellitus type 2, uncomplicated (CMS/HHS-HCC)    Hyperlipidemia  Myocardial infarction (CMS/HHS-HCC) 06/29/2002   non Q-wave   Nephrolithiasis    Venereal disease    Past Surgical History:  Procedure Laterality Date   APPENDECTOMY  1960   RIGHT KNEE ARTHROSCOPY  05/1998   medial and lateral meniscectomies, chondroplasty - [place],    TAXUS STENT  06/30/2002   proximal left circumflex and Cypher stent OM1 - [facility]   RIGHT TOTAL KNEE ARTHROPLASTY  05/18/2012   CARDIAC CATHETERIZATION  03/24/2013   patent coronary stent proximal left circumflex and first obtuse marginal branch   KIDNEY STONE REMOVED  mid 1980s   basket extraction   TONSILLECTOMY     Family History  Problem Relation Name Age of Onset    Myocardial Infarction (Heart attack) Father         Bypass surgery in his 17s   Coronary Artery Disease (Blocked arteries around heart) Father     Prostate cancer Father     Lung cancer Mother     Social History   Socioeconomic History   Marital status: Single  Tobacco Use   Smoking status: Former   Smokeless tobacco: Former  Building Services Engineer status: Never Used  Substance and Sexual Activity   Alcohol  use: No    Alcohol /week: 0.0 standard drinks of alcohol     Comment: Moderate   Drug use: No   Sexual activity: Never    Partners: Female   Social Drivers of Corporate Investment Banker Strain: Low Risk  (03/09/2024)   Overall Financial Resource Strain (CARDIA)    Difficulty of Paying Living Expenses: Not hard at all  Food Insecurity: No Food Insecurity (03/09/2024)   Hunger Vital Sign    Worried About Programme Researcher, Broadcasting/film/video in the Last Year: Never true    Ran Out of Food in the Last Year: Never true  Transportation Needs: No Transportation Needs (03/09/2024)   PRAPARE - Administrator, Civil Service (Medical): No    Lack of Transportation (Non-Medical): No  Housing Stability: Low Risk  (03/09/2024)   Housing Stability Vital Sign    Unable to Pay for Housing in the Last Year: No    Number of Times Moved in the Last Year: 0    Homeless in the Last Year: No   Outpatient Medications Prior to Visit  Medication Sig Dispense Refill   aspirin  81 MG EC tablet Take 81 mg by mouth once daily     atorvastatin (LIPITOR) 40 MG tablet TAKE 1 TABLET BY MOUTH DAILY 90 tablet 3   buPROPion  (WELLBUTRIN  XL) 150 MG XL tablet Take 150 mg by mouth once daily     cetirizine (ZYRTEC) 10 MG tablet TAKE ONE TABLET BY MOUTH EVERY DAY FOR ALLERGIES     co-enzyme Q-10, ubiquinone, 100 mg capsule Take 100 mg by mouth once daily Reported on 03/17/2015     cyanocobalamin (VITAMIN B12) 1000 MCG tablet Take 1 tablet by mouth once daily     empagliflozin (JARDIANCE) 10 mg tablet  Take 10 mg by mouth once daily     empagliflozin (JARDIANCE) 25 mg tablet Take 12.5 mg by mouth once daily     fish oil-dha-epa 1,200-144-216 mg Cap Take 1 capsule by mouth 2 (two) times daily     gabapentin  (NEURONTIN ) 300 MG capsule Take 300 mg by mouth 3 (three) times daily 1 cap qAM and 2 caps qHS     ketoconazole (NIZORAL) 2 % shampoo SHAMPOO SUFFICIENT AMOUNT TOPICALLY EVERY DAY  lisinopriL (ZESTRIL) 10 MG tablet take 1 tablet by mouth once daily 90 tablet 3   metFORMIN (GLUCOPHAGE) 500 MG tablet TAKE ONE TABLET TWICE A DAY WITH MEALS 180 tablet 3   metoprolol  SUCCinate (TOPROL -XL) 25 MG XL tablet TAKE 1 TABLET BY MOUTH ONCE DAILY. 90 tablet 3   mirabegron (MYRBETRIQ) 50 mg ER tablet Take 50 mg by mouth once daily     multivitamin tablet Take 1 tablet by mouth once daily.     naltrexone  (REVIA ) 50 mg tablet Take 25 mg by mouth once daily     oxybutynin  (DITROPAN ) 5 mg tablet Take 1 tablet (5 mg total) by mouth 3 (three) times daily 90 tablet 0   sertraline  (ZOLOFT ) 100 MG tablet Take 100 mg by mouth at bedtime     vibegron  75 mg Tab Take 1 tablet by mouth once daily     No facility-administered medications prior to visit.   Allergies  Allergen Reactions   Sulfa (Sulfonamide Antibiotics) Other (See Comments)    Told as a child, does not know reaction.    Objective:   Vitals:   03/09/24 1326  BP: 134/78  Pulse: 76  SpO2: 98%  Weight: (!) 101.1 kg (222 lb 14.4 oz)  Height: 184.2 cm (6' 0.5)  PainSc: 0-No pain   Body mass index is 29.82 kg/m.   GENERAL:  Patient is alert and oriented, in no acute distress.  HEENT:   Conjunctivae are noninjected.  Oropharynx:  Moist mucous membranes.  No erythema  or lesions. NECK:  No adenopathy, thyromegaly or tenderness. RESPIRATORY:  Normal inspiratory and expiratory effort.  Lungs are clear to auscultation bilaterally. CARDIOVASCULAR:  Regular rate and rhythm, S1, S2, without murmur, rub, or gallop. ABDOMEN:   Nontender and non-distended.   EXTREMITIES:  +2 pulses bilaterally.  No edema.  PHQ 2/9 from today's flowsheet  PHQ-2 PHQ-2 Over the last 2 weeks, how often have you been bothered by any of the following problems? Little interest or pleasure in doing things: Not at all Feeling down, depressed, or hopeless: Not at all Patient Health Questionnaire-2 Score: 0  PHQ-9 (if PHQ >=3)    Depression Severity and Treatment Recommendations:  0-4= None  5-9= Mild / Treatment: Support, educate to call if worse; return in one month  10-14= Moderate / Treatment: Support, watchful waiting; Antidepressant or Psychotherapy  15-19= Moderately severe / Treatment: Antidepressant OR Psychotherapy  >= 20 = Major depression, severe / Antidepressant AND Psychotherapy   Goals      Maintain health/healthy lifestyle     Maintain health/healthy lifestyle     Maintain health/healthy lifestyle     Maintain health/healthy lifestyle       Assessment/Plan:  79 year old male here for follow up, health maintenance issues and AWV. 1.  Diabetes mellitus type 2, uncomplicated.  Continue on Metformin daily.  Continue to watch diet and stay physically active.  Labs done at the [facility] regularly. 2.  HTN/Coronary artery disease. Stay on Lisinopril, Metoprolol  and aspirin  daily.  Follow up with Cardiology as directed.   3.  Hyperlipidemia.  Stay on Atorvastatin 40 mg daily.  Lipids and liver are checked at the [facility]. 4.  PTSD/history of alcohol  abuse.  Stay on Bupropion , Naltrexone  and Zoloft  daily.  Will start Trazodone 50 mg, 1/2-1 tab at night as needed for sleep issues.  Follow up with Psychiatry as directed. 5.  BPH.  Stay on Myrbetriq daily.  Follow up with Urology as directed. 6.  Peripheral neuropathy.  Stay on gabapentin  twice a day.   7.  Diarrhea.  Can use Imodium as needed.  If diarrhea persists, should let me know, and stool and blood tests will be considered.   8.  Health maintenance issues.  Is up to date with  all immunizations.  [facility]  Prostate cancer screening is done at the [facility].  He should stay physically active and maintain healthy lifestyle, with no smoking or alcohol .   8.  AWV.  Reviewed all recommended immunizations and screening tests.   9.  He will follow up with his various providers locally and at the [facility].  Follow up here in 12 months for recheck and health maintenance issues, sooner as needed for acute concerns.   Alm Na, MD, PhD  "

## 2024-04-07 ENCOUNTER — Ambulatory Visit: Admitting: Urology

## 2024-04-07 DIAGNOSIS — N3281 Overactive bladder: Secondary | ICD-10-CM

## 2024-04-07 DIAGNOSIS — R399 Unspecified symptoms and signs involving the genitourinary system: Secondary | ICD-10-CM

## 2024-04-08 ENCOUNTER — Ambulatory Visit: Payer: Self-pay | Admitting: Urology

## 2024-05-11 ENCOUNTER — Ambulatory Visit: Admitting: Psychiatry

## 2024-10-15 ENCOUNTER — Other Ambulatory Visit

## 2024-10-15 ENCOUNTER — Ambulatory Visit: Admitting: Oncology
